# Patient Record
Sex: Female | Born: 1980 | Race: White | Hispanic: No | Marital: Married | Smoking: Never smoker
Health system: Southern US, Community
[De-identification: ages and names within clinical notes are randomized; demographics above are authoritative.]

## PROBLEM LIST (undated history)

## (undated) DIAGNOSIS — C801 Malignant (primary) neoplasm, unspecified: Secondary | ICD-10-CM

## (undated) HISTORY — PX: BREAST FIBROADENOMA SURGERY: SHX580

## (undated) HISTORY — PX: WISDOM TOOTH EXTRACTION: SHX21

---

## 2012-10-05 ENCOUNTER — Other Ambulatory Visit (HOSPITAL_COMMUNITY)
Admission: RE | Admit: 2012-10-05 | Discharge: 2012-10-05 | Disposition: A | Discharge status: Home / Self Care | Payer: BC Managed Care – PPO | Source: Ambulatory Visit | Attending: Family Medicine | Admitting: Family Medicine

## 2012-10-05 DIAGNOSIS — Z124 Encounter for screening for malignant neoplasm of cervix: Secondary | ICD-10-CM | POA: Insufficient documentation

## 2014-09-23 ENCOUNTER — Other Ambulatory Visit (HOSPITAL_COMMUNITY): Payer: Self-pay | Admitting: Obstetrics & Gynecology

## 2014-09-23 DIAGNOSIS — N979 Female infertility, unspecified: Secondary | ICD-10-CM

## 2014-09-29 ENCOUNTER — Ambulatory Visit (HOSPITAL_COMMUNITY)
Admission: RE | Admit: 2014-09-29 | Discharge: 2014-09-29 | Disposition: A | Discharge status: Home / Self Care | Payer: BC Managed Care – PPO | Source: Ambulatory Visit | Attending: Obstetrics & Gynecology | Admitting: Obstetrics & Gynecology

## 2014-09-29 DIAGNOSIS — N979 Female infertility, unspecified: Secondary | ICD-10-CM

## 2014-09-30 ENCOUNTER — Ambulatory Visit (HOSPITAL_COMMUNITY)
Admission: RE | Admit: 2014-09-30 | Discharge: 2014-09-30 | Disposition: A | Discharge status: Home / Self Care | Payer: BC Managed Care – PPO | Source: Ambulatory Visit | Attending: Obstetrics & Gynecology | Admitting: Obstetrics & Gynecology

## 2014-09-30 DIAGNOSIS — N979 Female infertility, unspecified: Secondary | ICD-10-CM | POA: Insufficient documentation

## 2014-09-30 MED ORDER — IOHEXOL 300 MG/ML  SOLN
20.0000 mL | Freq: Once | INTRAMUSCULAR | Status: AC | PRN
  Administered 2014-09-30: 20 mL

## 2015-08-13 NOTE — L&D Delivery Note (Signed)
Delivery Note - Water birth  AROM @1632 , clear AF Back into tub at 1700  Complete dilation at 1710 Onset of pushing at 1710 FHR second stage reassuring, intermittent auscultation.  Analgesia /Anesthesia intrapartum: none  Delivery of a viable baby girl at 8 by CNM in ROA --> ROT position.  Nuchal Cord - none. Cord double clamped after cessation of pulsation, cut by FOB.  Cord blood sample collected. Arterial cord blood sample not warranted.  Out of tub at 1735  Placenta delivered at 1741, schultz, intact with 3 VC. Velamentous cord insertion Placenta to L&D for disposal. Uterine tone atonic intermittently, bleeding moderate with clots removed from LUS. Pitocin IM 10 units initially, then IV Pitocin bolus started and oral misoprostol 400 mcg given.  Uterus firm and scant lochia after 10 min.   2nd degree perineal and L periurethral laceration identified.  Anesthesia: 1% lido Repair with 3.0 vycril in standard fashion, hemostasis noted.  Est. Blood Loss (mL): [id number] cc  Complications: none APGAR 7 / 9 Weight pending Mom to postpartum.  Baby to Couplet care / Skin to Skin.  Juliene Pina, CNM 03/14/2016, 6:31 PM

## 2016-03-14 ENCOUNTER — Encounter (HOSPITAL_COMMUNITY): Payer: Self-pay | Admitting: *Deleted

## 2016-03-14 ENCOUNTER — Inpatient Hospital Stay (HOSPITAL_COMMUNITY)
Admission: AD | Admit: 2016-03-14 | Discharge: 2016-03-16 | DRG: 775 | Disposition: A | Discharge status: Home / Self Care | Payer: BC Managed Care – PPO | Source: Ambulatory Visit | Attending: Obstetrics and Gynecology | Admitting: Obstetrics and Gynecology

## 2016-03-14 ENCOUNTER — Inpatient Hospital Stay (HOSPITAL_COMMUNITY)
Admission: AD | Admit: 2016-03-14 | Discharge: 2016-03-14 | Disposition: A | Discharge status: Home / Self Care | Payer: BC Managed Care – PPO | Source: Ambulatory Visit | Attending: Obstetrics and Gynecology | Admitting: Obstetrics and Gynecology

## 2016-03-14 ENCOUNTER — Encounter (HOSPITAL_COMMUNITY): Payer: Self-pay

## 2016-03-14 DIAGNOSIS — Z3A40 40 weeks gestation of pregnancy: Secondary | ICD-10-CM

## 2016-03-14 DIAGNOSIS — O471 False labor at or after 37 completed weeks of gestation: Secondary | ICD-10-CM

## 2016-03-14 DIAGNOSIS — Z6791 Unspecified blood type, Rh negative: Secondary | ICD-10-CM

## 2016-03-14 DIAGNOSIS — Z3490 Encounter for supervision of normal pregnancy, unspecified, unspecified trimester: Secondary | ICD-10-CM

## 2016-03-14 DIAGNOSIS — O26893 Other specified pregnancy related conditions, third trimester: Secondary | ICD-10-CM | POA: Diagnosis present

## 2016-03-14 DIAGNOSIS — O43123 Velamentous insertion of umbilical cord, third trimester: Secondary | ICD-10-CM | POA: Diagnosis present

## 2016-03-14 DIAGNOSIS — Z3403 Encounter for supervision of normal first pregnancy, third trimester: Secondary | ICD-10-CM | POA: Diagnosis present

## 2016-03-14 DIAGNOSIS — O26899 Other specified pregnancy related conditions, unspecified trimester: Secondary | ICD-10-CM | POA: Diagnosis present

## 2016-03-14 LAB — OB RESULTS CONSOLE ABO/RH: RH Type: NEGATIVE

## 2016-03-14 LAB — CBC
HEMATOCRIT: 33 % — AB (ref 36.0–46.0)
Hemoglobin: 11.7 g/dL — ABNORMAL LOW (ref 12.0–15.0)
MCH: 32.8 pg (ref 26.0–34.0)
MCHC: 35.5 g/dL (ref 30.0–36.0)
MCV: 92.4 fL (ref 78.0–100.0)
Platelets: 241 10*3/uL (ref 150–400)
RBC: 3.57 MIL/uL — ABNORMAL LOW (ref 3.87–5.11)
RDW: 13.8 % (ref 11.5–15.5)
WBC: 11.8 10*3/uL — ABNORMAL HIGH (ref 4.0–10.5)

## 2016-03-14 LAB — OB RESULTS CONSOLE HEPATITIS B SURFACE ANTIGEN: HEP B S AG: NEGATIVE

## 2016-03-14 LAB — OB RESULTS CONSOLE GC/CHLAMYDIA
CHLAMYDIA, DNA PROBE: NEGATIVE
GC PROBE AMP, GENITAL: NEGATIVE

## 2016-03-14 LAB — OB RESULTS CONSOLE HIV ANTIBODY (ROUTINE TESTING): HIV: NONREACTIVE

## 2016-03-14 LAB — OB RESULTS CONSOLE RPR: RPR: NONREACTIVE

## 2016-03-14 LAB — OB RESULTS CONSOLE GBS: GBS: NEGATIVE

## 2016-03-14 LAB — OB RESULTS CONSOLE ANTIBODY SCREEN: Antibody Screen: NEGATIVE

## 2016-03-14 LAB — OB RESULTS CONSOLE RUBELLA ANTIBODY, IGM: Rubella: IMMUNE

## 2016-03-14 MED ORDER — COCONUT OIL OIL: 1.0000 "application " | TOPICAL_OIL | Status: DC | PRN

## 2016-03-14 MED ORDER — ACETAMINOPHEN 325 MG PO TABS: 650.0000 mg | ORAL_TABLET | ORAL | Status: DC | PRN

## 2016-03-14 MED ORDER — SOD CITRATE-CITRIC ACID 500-334 MG/5ML PO SOLN: 30.0000 mL | ORAL | Status: DC | PRN

## 2016-03-14 MED ORDER — MISOPROSTOL 200 MCG PO TABS
ORAL_TABLET | ORAL | Status: AC
  Administered 2016-03-14: 400 ug
  Filled 2016-03-14: qty 2

## 2016-03-14 MED ORDER — LACTATED RINGERS IV SOLN: INTRAVENOUS | Status: DC

## 2016-03-14 MED ORDER — ONDANSETRON HCL 4 MG PO TABS: 4.0000 mg | ORAL_TABLET | ORAL | Status: DC | PRN

## 2016-03-14 MED ORDER — OXYTOCIN 40 UNITS IN LACTATED RINGERS INFUSION - SIMPLE MED: 2.5000 [IU]/h | INTRAVENOUS | Status: DC

## 2016-03-14 MED ORDER — SIMETHICONE 80 MG PO CHEW: 80.0000 mg | CHEWABLE_TABLET | ORAL | Status: DC | PRN

## 2016-03-14 MED ORDER — OXYTOCIN 40 UNITS IN LACTATED RINGERS INFUSION - SIMPLE MED
20.0000 [IU] | Freq: Once | INTRAVENOUS | Status: AC
  Administered 2016-03-14: 20 [IU] via INTRAVENOUS

## 2016-03-14 MED ORDER — LIDOCAINE HCL (PF) 1 % IJ SOLN
30.0000 mL | INTRAMUSCULAR | Status: AC | PRN
  Administered 2016-03-14: 30 mL via SUBCUTANEOUS
  Filled 2016-03-14: qty 30

## 2016-03-14 MED ORDER — ONDANSETRON HCL 4 MG/2ML IJ SOLN: 4.0000 mg | Freq: Four times a day (QID) | INTRAMUSCULAR | Status: DC | PRN

## 2016-03-14 MED ORDER — BENZOCAINE-MENTHOL 20-0.5 % EX AERO
1.0000 "application " | INHALATION_SPRAY | CUTANEOUS | Status: DC | PRN
  Administered 2016-03-15: 1 via TOPICAL
  Filled 2016-03-14: qty 56

## 2016-03-14 MED ORDER — OXYCODONE-ACETAMINOPHEN 5-325 MG PO TABS: 2.0000 | ORAL_TABLET | ORAL | Status: DC | PRN

## 2016-03-14 MED ORDER — MISOPROSTOL 200 MCG PO TABS
400.0000 ug | ORAL_TABLET | Freq: Once | ORAL | Status: AC
  Administered 2016-03-14: 400 ug via ORAL

## 2016-03-14 MED ORDER — WITCH HAZEL-GLYCERIN EX PADS: 1.0000 "application " | MEDICATED_PAD | CUTANEOUS | Status: DC | PRN

## 2016-03-14 MED ORDER — IBUPROFEN 600 MG PO TABS
600.0000 mg | ORAL_TABLET | Freq: Four times a day (QID) | ORAL | Status: DC
  Administered 2016-03-15 – 2016-03-16 (×7): 600 mg via ORAL
  Filled 2016-03-14 (×7): qty 1

## 2016-03-14 MED ORDER — SENNOSIDES-DOCUSATE SODIUM 8.6-50 MG PO TABS
2.0000 | ORAL_TABLET | ORAL | Status: DC
  Administered 2016-03-15 (×2): 2 via ORAL
  Filled 2016-03-14 (×2): qty 2

## 2016-03-14 MED ORDER — OXYTOCIN 40 UNITS IN LACTATED RINGERS INFUSION - SIMPLE MED
INTRAVENOUS | Status: AC
  Filled 2016-03-14: qty 1000

## 2016-03-14 MED ORDER — LACTATED RINGERS IV SOLN: 500.0000 mL | INTRAVENOUS | Status: DC | PRN

## 2016-03-14 MED ORDER — PRENATAL MULTIVITAMIN CH
1.0000 | ORAL_TABLET | Freq: Every day | ORAL | Status: DC
  Administered 2016-03-15 – 2016-03-16 (×2): 1 via ORAL
  Filled 2016-03-14 (×2): qty 1

## 2016-03-14 MED ORDER — ONDANSETRON HCL 4 MG/2ML IJ SOLN
4.0000 mg | INTRAMUSCULAR | Status: DC | PRN
Start: 2016-03-14 — End: 2016-03-15

## 2016-03-14 MED ORDER — OXYTOCIN BOLUS FROM INFUSION: 500.0000 mL | Freq: Once | INTRAVENOUS | Status: DC

## 2016-03-14 MED ORDER — OXYCODONE-ACETAMINOPHEN 5-325 MG PO TABS: 1.0000 | ORAL_TABLET | ORAL | Status: DC | PRN

## 2016-03-14 MED ORDER — OXYTOCIN 10 UNIT/ML IJ SOLN
10.0000 [IU] | Freq: Once | INTRAMUSCULAR | Status: AC
Start: 2016-03-14 — End: 2016-03-14
  Administered 2016-03-14: 10 [IU] via INTRAMUSCULAR
  Filled 2016-03-14: qty 1

## 2016-03-14 MED ORDER — DIPHENHYDRAMINE HCL 25 MG PO CAPS: 25.0000 mg | ORAL_CAPSULE | Freq: Four times a day (QID) | ORAL | Status: DC | PRN

## 2016-03-14 MED ORDER — DIBUCAINE 1 % RE OINT: 1.0000 "application " | TOPICAL_OINTMENT | RECTAL | Status: DC | PRN

## 2016-03-14 NOTE — Progress Notes (Signed)
Ana Wu is a 35 y.o. G1P0 at [redacted]w[redacted]d by ultrasound admitted for active labor  Subjective: Labored in water for past 2 hrs, feels ctx stronger than this AM. No rectal pressure. Pain mostly in her back. Partner and Luvenia Starch (doula) in room providing labor support.   Objective:   VSSAF  FHT:  FHR: 130 bpm, on IEFM, no audible decels UC:   regular, every 4 minutes SVE:   Dilation: 8 Effacement (%): 90, 100 Station: -1 Exam by:: Haskel Khan, CNM BBOW, minimal show. ROT presentation  Labs:   Recent Labs  03/14/16 1109  WBC 11.8*  HGB 11.7*  HCT 33.0*  PLT 241    Assessment / Plan: G1 at [redacted]w[redacted]d, active labor  Low risk, desires minimal intervention in labor, planned waterbirth.  Labor: Progressing normally  Will come out of tub for next 1-2 hours, reposition on BB or bed intermittently.   Preeclampsia:  no signs or symptoms of toxicity Fetal Wellbeing:  reassuring Pain Control:  Labor support without medications and Water tub I/D:  n/a, GBS neg Anticipated MOD:  NSVD  Juliene Pina 03/14/2016, 3:25 PM

## 2016-03-14 NOTE — H&P (Signed)
  OB ADMISSION/ HISTORY & PHYSICAL:  Admission Date: 03/14/2016 10:10 AM  Admit Diagnosis: 40.5 weeks / active labor  Ana Wu is a 35 y.o. female presenting for labor onset.  Prenatal History: G1P0   EDC : 03/09/2016, by Other Basis  Prenatal care at [facility]  Primary Ob Provider: Cora Collum Prenatal course - IVF pregnancy with   Prenatal Labs: ABO, Rh:  A negative Antibody:  negative  (rhogam antepartum) Rubella:   Immune RPR:   NR HBsAg:   Negative HIV:   NR GTT: normal GBS:   negative  Medical / Surgical History :  Past medical history: No past medical history on file.   Past surgical history: No past surgical history on file.  Family History: No family history on file.   Social History:  has no tobacco, alcohol, and drug history on file.   Allergies: Review of patient's allergies indicates no known allergies.    Current Medications at time of admission:  Prior to Admission medications   Medication Sig Start Date End Date Taking? Authorizing Provider  Prenatal Vit-Fe Fumarate-FA (PRENATAL VITAMIN PO) Take by mouth.   Yes Historical Provider, MD   Review of Systems: Active FM ctx currently every 4 minutes No LOF bloody show present  Physical Exam:  VS: Blood pressure 124/62, pulse 82, temperature 99.1 F (37.3 C), temperature source Oral, resp. rate 16, height 5\' 3"  (1.6 m), weight 81.6 kg (180 lb).  General: alert and oriented, appears uncomfortable but calm / breathing well with ctx Heart: RRR Lungs: Clear lung fields Abdomen: Gravid, soft and non-tender, non-distended / uterus: gravid Extremities: no  edema  Genitalia / VE: Dilation: 7 Effacement (%): 90, 100 Station: -1 Exam by:: Artelia Laroche, CNM  FHR: baseline rate 130 / variability moderate / accelerations / no decelerations TOCO: ctx Q 3-4 minutes  Assessment: 40.[redacted] weeks gestation active stage of labor FHR category 1   Plan:  Admit Water immersion  Dr  Ronita Hipps notified of admission / plan of care   Artelia Laroche CNM, MSN, [facility] 03/14/2016, 10:42 AM

## 2016-03-14 NOTE — MAU Provider Note (Signed)
History     Chief Complaint  Patient presents with  . Labor Eval   HPI  Ctx since 1am, was checked about 1 hr ago 3-4 cm, + FM, no LOF/VB. Coping well with labor pains, feels a bit hungry now nausea has resolved. Desires unmedicated birth / water labor and birth. Has doula and partner for support.   OB History    Gravida Para Term Preterm AB Living   1             SAB TAB Ectopic Multiple Live Births                    Social History  Substance Use Topics  . Smoking status: Never Smoker  . Smokeless tobacco: Never Used  . Alcohol use No    Allergies: No Known Allergies  Prescriptions Prior to Admission  Medication Sig Dispense Refill Last Dose  . Prenatal Vit-Fe Fumarate-FA (PRENATAL VITAMIN PO) Take by mouth.       ROS Physical Exam   VSSAF FHR 130, mod var, + accels, no decels Ctx q 3-5 min, mild/mod to palp  Physical Exam:  General: NAD Abd: Soft, NT, EFW 7.5 lbs  Ext: no edema Neuro: DTRs normal SVE 5/100/-1, BBOW, minimal show, membranes stripped.    ED Course  Procedures   A/P:  G1 at [redacted]w[redacted]d, in labor FHT category 1 GBS neg, Rh neg, s/p rhogam at 28 wks Desires waterbirth and coping well with labor pain  After discussion with patient and her spouse, will DC home for next couple of hours to await active labor, may have light breakfast. Return for direct admit in next couple of hours or if SROM or rectal pressure with ctx.

## 2016-03-14 NOTE — Discharge Instructions (Signed)
Fetal Movement Counts  Patient Name: __________________________________________________ Patient Due Date: ____________________  Performing a fetal movement count is highly recommended in high-risk pregnancies, but it is good for every pregnant woman to do. Your health care provider may ask you to start counting fetal movements at 28 weeks of the pregnancy. Fetal movements often increase:  · After eating a full meal.  · After physical activity.  · After eating or drinking something sweet or cold.  · At rest.  Pay attention to when you feel the baby is most active. This will help you notice a pattern of your baby's sleep and wake cycles and what factors contribute to an increase in fetal movement. It is important to perform a fetal movement count at the same time each day when your baby is normally most active.   HOW TO COUNT FETAL MOVEMENTS  1. Find a quiet and comfortable area to sit or lie down on your left side. Lying on your left side provides the best blood and oxygen circulation to your baby.  2. Write down the day and time on a sheet of paper or in a journal.  3. Start counting kicks, flutters, swishes, rolls, or jabs in a 2-hour period. You should feel at least 10 movements within 2 hours.  4. If you do not feel 10 movements in 2 hours, wait 2-3 hours and count again. Look for a change in the pattern or not enough counts in 2 hours.  SEEK MEDICAL CARE IF:  · You feel less than 10 counts in 2 hours, tried twice.  · There is no movement in over an hour.  · The pattern is changing or taking longer each day to reach 10 counts in 2 hours.  · You feel the baby is not moving as he or she usually does.  Date: ____________ Movements: ____________ Start time: ____________ Finish time: ____________   Date: ____________ Movements: ____________ Start time: ____________ Finish time: ____________  Date: ____________ Movements: ____________ Start time: ____________ Finish time: ____________  Date: ____________ Movements:  ____________ Start time: ____________ Finish time: ____________  Date: ____________ Movements: ____________ Start time: ____________ Finish time: ____________  Date: ____________ Movements: ____________ Start time: ____________ Finish time: ____________  Date: ____________ Movements: ____________ Start time: ____________ Finish time: ____________  Date: ____________ Movements: ____________ Start time: ____________ Finish time: ____________   Date: ____________ Movements: ____________ Start time: ____________ Finish time: ____________  Date: ____________ Movements: ____________ Start time: ____________ Finish time: ____________  Date: ____________ Movements: ____________ Start time: ____________ Finish time: ____________  Date: ____________ Movements: ____________ Start time: ____________ Finish time: ____________  Date: ____________ Movements: ____________ Start time: ____________ Finish time: ____________  Date: ____________ Movements: ____________ Start time: ____________ Finish time: ____________  Date: ____________ Movements: ____________ Start time: ____________ Finish time: ____________   Date: ____________ Movements: ____________ Start time: ____________ Finish time: ____________  Date: ____________ Movements: ____________ Start time: ____________ Finish time: ____________  Date: ____________ Movements: ____________ Start time: ____________ Finish time: ____________  Date: ____________ Movements: ____________ Start time: ____________ Finish time: ____________  Date: ____________ Movements: ____________ Start time: ____________ Finish time: ____________  Date: ____________ Movements: ____________ Start time: ____________ Finish time: ____________  Date: ____________ Movements: ____________ Start time: ____________ Finish time: ____________   Date: ____________ Movements: ____________ Start time: ____________ Finish time: ____________  Date: ____________ Movements: ____________ Start time: ____________ Finish  time: ____________  Date: ____________ Movements: ____________ Start time: ____________ Finish time: ____________  Date: ____________ Movements: ____________ Start time:   ____________ Finish time: ____________  Date: ____________ Movements: ____________ Start time: ____________ Finish time: ____________  Date: ____________ Movements: ____________ Start time: ____________ Finish time: ____________  Date: ____________ Movements: ____________ Start time: ____________ Finish time: ____________   Date: ____________ Movements: ____________ Start time: ____________ Finish time: ____________  Date: ____________ Movements: ____________ Start time: ____________ Finish time: ____________  Date: ____________ Movements: ____________ Start time: ____________ Finish time: ____________  Date: ____________ Movements: ____________ Start time: ____________ Finish time: ____________  Date: ____________ Movements: ____________ Start time: ____________ Finish time: ____________  Date: ____________ Movements: ____________ Start time: ____________ Finish time: ____________  Date: ____________ Movements: ____________ Start time: ____________ Finish time: ____________   Date: ____________ Movements: ____________ Start time: ____________ Finish time: ____________  Date: ____________ Movements: ____________ Start time: ____________ Finish time: ____________  Date: ____________ Movements: ____________ Start time: ____________ Finish time: ____________  Date: ____________ Movements: ____________ Start time: ____________ Finish time: ____________  Date: ____________ Movements: ____________ Start time: ____________ Finish time: ____________  Date: ____________ Movements: ____________ Start time: ____________ Finish time: ____________  Date: ____________ Movements: ____________ Start time: ____________ Finish time: ____________   Date: ____________ Movements: ____________ Start time: ____________ Finish time: ____________  Date: ____________  Movements: ____________ Start time: ____________ Finish time: ____________  Date: ____________ Movements: ____________ Start time: ____________ Finish time: ____________  Date: ____________ Movements: ____________ Start time: ____________ Finish time: ____________  Date: ____________ Movements: ____________ Start time: ____________ Finish time: ____________  Date: ____________ Movements: ____________ Start time: ____________ Finish time: ____________  Date: ____________ Movements: ____________ Start time: ____________ Finish time: ____________   Date: ____________ Movements: ____________ Start time: ____________ Finish time: ____________  Date: ____________ Movements: ____________ Start time: ____________ Finish time: ____________  Date: ____________ Movements: ____________ Start time: ____________ Finish time: ____________  Date: ____________ Movements: ____________ Start time: ____________ Finish time: ____________  Date: ____________ Movements: ____________ Start time: ____________ Finish time: ____________  Date: ____________ Movements: ____________ Start time: ____________ Finish time: ____________     This information is not intended to replace advice given to you by your health care provider. Make sure you discuss any questions you have with your health care provider.     Document Released: 08/28/2006 Document Revised: 08/19/2014 Document Reviewed: 05/25/2012  Elsevier Interactive Patient Education ©2016 [employer]

## 2016-03-14 NOTE — Progress Notes (Signed)
S: Doing well, coping with labor ctx well, laboring in water mostly with intermittent periods on birthing stool. Feeling pelvic pressure/rectal pressure occasionally for past hour.   O: Vitals:   03/14/16 1242 03/14/16 1246 03/14/16 1521 03/14/16 1608  BP:  (!) 111/58  (!) 141/84  Pulse:  86  90  Resp: 18  18 20   Temp: 98.5 F (36.9 C)  98.6 F (37 C)   TempSrc: Oral  Oral   Weight:      Height:         FHT:  FHR: 125 bpm, variability: moderate,  accelerations:  Present,  decelerations:  Absent UC:   regular, every 3-4 minutes SVE:   Dilation: 9 Effacement (%): 90, 100 Station: -1 Exam by:: Haskel Khan, CNM AROM clear AF  A / P: Spontaneous labor, progressing normally  Fetal Wellbeing:  Category I Pain Control:  Water tub Will return to water after 15 min EFM post AROM Anticipated MOD:  NSVD  Juliene Pina 03/14/2016, 4:36 PM

## 2016-03-14 NOTE — MAU Note (Signed)
UC since 0100am, denies bleeding

## 2016-03-14 NOTE — Progress Notes (Signed)
   03/14/16 0708  Provider Notification  Provider Name/Title Graceann Congress, CNM  Method of Notification Phone  RRenato Battles, cnm called back not to admit pt. Melina Copa, cnm oncoming provider will come and evaluate pt.

## 2016-03-15 LAB — CBC
HCT: 28.7 % — ABNORMAL LOW (ref 36.0–46.0)
HEMOGLOBIN: 10.2 g/dL — AB (ref 12.0–15.0)
MCH: 32.9 pg (ref 26.0–34.0)
MCHC: 35.5 g/dL (ref 30.0–36.0)
MCV: 92.6 fL (ref 78.0–100.0)
Platelets: 217 10*3/uL (ref 150–400)
RBC: 3.1 MIL/uL — ABNORMAL LOW (ref 3.87–5.11)
RDW: 14.1 % (ref 11.5–15.5)
WBC: 12.8 10*3/uL — AB (ref 4.0–10.5)

## 2016-03-15 LAB — RPR: RPR Ser Ql: NONREACTIVE

## 2016-03-15 NOTE — Progress Notes (Signed)
PPD 1 SVD  S:  Reports feeling well - little tired but slept some             Tolerating po/ No nausea or vomiting             Bleeding is light             Pain controlled with motrin             Up ad lib / ambulatory / voiding QS  Newborn breast feeding  / female O:               VS: BP 125/62 (BP Location: Right Arm)   Pulse 84   Temp 98.6 F (37 C) (Oral)   Resp 18   Ht 5\' 3"  (1.6 m)   Wt 81.6 kg (180 lb)   Breastfeeding? Unknown   BMI 31.89 kg/m    LABS:              Recent Labs  03/14/16 1109 03/15/16 0507  WBC 11.8* 12.8*  HGB 11.7* 10.2*  PLT 241 217               Blood type: --/--/A NEG (08/03 1109) / newborn negative  Rubella: Immune (08/03 1043)                           Physical Exam:             Alert and oriented X3  Abdomen: soft, non-tender, non-distended              Fundus: firm, non-tender, U-1  Perineum: ice pack in place  Lochia: ligt  Extremities: trace pedal edema, no calf pain or tenderness    A: PPD # 1   Doing well - stable status  P: Routine post partum orders   Artelia Laroche CNM, MSN, [facility] 03/15/2016, 10:45 AM

## 2016-03-15 NOTE — Lactation Note (Addendum)
This note was copied from a baby's chart. Lactation Consultation Note  Baby is 46 HOL and continues to be sleepy. She is able to suck well when a gloved finger in inserted deeply into her mouth. She has moments of tongue thrusting when she tries to push it out. She briefly suckled and a few swallows were heard.  She also received a few drops of colostrum on a spoon.  Baby was left skin-to-skin on her mother. Advised not having visitors handle her until she is feeding better. Information given on support groups and OP services. Follow-up tomorrow   Patient Name: Ana Wu [id number] Date: 03/15/2016 Reason for consult: Initial assessment   Maternal Data Has patient been taught Hand Expression?: Yes  Feeding Feeding Type: Breast Fed Length of feed: 2 min (few swallows and drops to mouth)  LATCH Score/Interventions Latch: Repeated attempts needed to sustain latch, nipple held in mouth throughout feeding, stimulation needed to elicit sucking reflex. Intervention(s): Skin to skin;Teach feeding cues;Waking techniques Intervention(s): Adjust position;Assist with latch;Breast compression  Audible Swallowing: A few with stimulation Intervention(s): Skin to skin;Hand expression Intervention(s): Skin to skin;Hand expression  Type of Nipple: Everted at rest and after stimulation  Comfort (Breast/Nipple): Filling, red/small blisters or bruises, mild/mod discomfort     Hold (Positioning): Full assist, staff holds infant at breast Intervention(s): Breastfeeding basics reviewed;Support Pillows;Position options;Skin to skin  LATCH Score: 5  Lactation Tools Discussed/Used     Consult Status      Van Clines 03/15/2016, 12:07 PM

## 2016-03-15 NOTE — Progress Notes (Signed)
At bedside with MOB. Pt is emotional about breastfeeding. She feels like shes doing something wrong because baby cries despite latching. Emotional support and education given at the bedside. Mom feeling better before I left.

## 2016-03-16 MED ORDER — IBUPROFEN 600 MG PO TABS: 600.0000 mg | ORAL_TABLET | Freq: Four times a day (QID) | ORAL | 0 refills | Status: DC

## 2016-03-16 NOTE — Discharge Summary (Signed)
OB Discharge Summary  Patient Name: Ana Wu DOB: Oct 19, 1980 MRN: [id number]  Date of admission: 03/14/2016  Admitting diagnosis: LABOR Intrauterine pregnancy: [redacted]w[redacted]d      Date of discharge: 03/16/2016    Discharge diagnosis: Term Pregnancy Delivered      Prenatal history: G1P1001   EDC : 03/09/2016, by Other Basis  Prenatal care at [facility]  Primary provider : Cora Collum Prenatal course uncomplicated  Prenatal Labs: ABO, Rh: --/--/A NEG (08/03 1109) / Rhophylac given antepartum x 2 Antibody: POS (08/03 1109) Rubella: Immune (08/03 1043)   RPR: Non Reactive (08/03 1109)  HBsAg: Negative (08/03 1043)  HIV: Non-reactive (08/03 1043)  GBS: Negative (08/03 1043)                                    Hospital course:  Onset of Labor With Vaginal Delivery     35 y.o. yo G1P1001 at [redacted]w[redacted]d was admitted in Active Labor on 03/14/2016. Patient had an uncomplicated labor course as follows:  Membrane Rupture Time/Date: 4:32 PM ,03/14/2016   Intrapartum Procedures: Episiotomy: None [1]                                         Lacerations:  2nd degree [3];Perineal [11] repaired Patient had a delivery of a Viable infant. 03/14/2016  Information for the patient's newborn:  Bev, Lawter Girl Milderd [id number]  Delivery Method: Vag-Spont    Pateint had an uncomplicated postpartum course.  She is ambulating, tolerating a regular diet, passing flatus, and urinating well. Patient is discharged home in stable condition on 03/16/16.  Delivering PROVIDER: Derrell Lolling C                                                            Complications: None  Newborn Data: Live born female  Birth Weight: 7 lb 15.3 oz (3610 g) APGAR: 7, 9  Baby Feeding: Breast Disposition:home with mother  Post partum procedures:none  Postpartum contraception: Not Discussed    Labs: Lab Results  Component Value Date   WBC 12.8 (H) 03/15/2016   HGB 10.2 (L) 03/15/2016   HCT 28.7 (L) 03/15/2016    MCV 92.6 03/15/2016   PLT 217 03/15/2016   No flowsheet data found.  Physical Exam @ time of discharge:  Vitals:   03/15/16 0005 03/15/16 1020 03/15/16 1800 03/16/16 0532  BP: 125/62 126/72 121/71 (!) 110/56  Pulse: 84 80 70 (!) 59  Resp: 18 18 18 18   Temp: 98.6 F (37 C) 98.4 F (36.9 C) 98 F (36.7 C) 97.6 F (36.4 C)  TempSrc: Oral Oral Oral Oral  Weight:      Height:        General: alert and cooperative Lochia: appropriate Uterine Fundus: firm Perineum: repair healing well / mild edema  Extremities: DVT Evaluation: No evidence of DVT seen on physical exam.   Discharge instructions:  "Baby and Me Booklet" and [facility] Booklet  Discharge Medications:    Medication List    TAKE these medications   ibuprofen 600 MG tablet Commonly known as:  ADVIL,MOTRIN Take 1  tablet (600 mg total) by mouth every 6 (six) hours.   OVER THE COUNTER MEDICATION Take 4 tablets by mouth daily. Patient takes over the counter Omega 3 supplement   PRENATAL VITAMIN PO Take by mouth.       Diet: routine diet  Activity: Advance as tolerated. Pelvic rest x 6 weeks.   Follow up:6 weeks    Signed: Artelia Laroche CNM, MSN, [facility] 03/16/2016, 11:16 AM

## 2016-03-16 NOTE — Lactation Note (Signed)
This note was copied from a baby's chart. Lactation Consultation Note Mom having difficulty obtaining deep latch per mom. Parents stated baby cluster fed all night, but didn't obtain but one good latch. Mom has flat nipples until rolled w/finger tips, everts well for short period. Nipples and areola very compressible to obtain deep latch if baby would suck. Baby very sleepy! Cool wet cloth aplied, much stimulation, w/o waking baby enough for BF. Jaundice. Report to RN to check Bili. Report to CN RN baby LC feels baby needs to stay another day for assistance in BF. Mom has pendulum breast, elevated w/cloth for support. Baby doesn't open very wide. Has thick upper labial frenulum, high palate, has good protrusion of tongue past gum line. W/gloved finger tried to stimulate suck, had no interest. No interest in BF at this time. Asked RN to set up DEBP for stimulation. LC to give shells to wear in bra. Had 8% weight loss, 6 voids, 6 stools. BF has short feedings. Discussed stimulating baby to feed longer. Will assess feedings later today.  Patient Name: Girl Nalina Work [id number] Date: 03/16/2016 Reason for consult: Follow-up assessment;Difficult latch;Hyperbilirubinemia   Maternal Data    Feeding Feeding Type: Breast Fed Length of feed: 0 min  LATCH Score/Interventions Latch: Too sleepy or reluctant, no latch achieved, no sucking elicited. Intervention(s): Skin to skin;Teach feeding cues;Waking techniques Intervention(s): Adjust position;Assist with latch;Breast massage;Breast compression  Audible Swallowing: None Intervention(s): Skin to skin;Hand expression Intervention(s): Alternate breast massage  Type of Nipple: Everted at rest and after stimulation  Comfort (Breast/Nipple): Soft / non-tender     Hold (Positioning): Full assist, staff holds infant at breast Intervention(s): Breastfeeding basics reviewed;Support Pillows;Position options;Skin to skin  LATCH Score: 4  Lactation Tools  Discussed/Used     Consult Status Consult Status: Follow-up Date: 03/16/16 Follow-up type: In-patient    Peggye Poon, [place]ta [place] 03/16/2016, 9:45 AM

## 2016-03-16 NOTE — Progress Notes (Signed)
PPD 2 SVD with 2nd degree repair  S:  Reports feeling tired             Tolerating po/ No nausea or vomiting             Bleeding is light             Pain controlled with motrin             Up ad lib / ambulatory / voiding QS  Newborn breast feeding - uncertain newborn DC today O:               VS: BP (!) 110/56 (BP Location: Left Arm)   Pulse (!) 59   Temp 97.6 F (36.4 C) (Oral)   Resp 18   Ht 5\' 3"  (1.6 m)   Wt 180 lb (81.6 kg)   Breastfeeding? Unknown   BMI 31.89 kg/m    LABS:              Recent Labs  03/14/16 1109 03/15/16 0507  WBC 11.8* 12.8*  HGB 11.7* 10.2*  PLT 241 217               Blood type: --/--/A NEG (08/03 1109) / newborn Rh negative - no rhogam indictaed  Rubella: Immune (08/03 1043)                           Physical Exam:             Alert and oriented X3  Abdomen: soft, non-tender, non-distended              Fundus: firm, non-tender, U-1  Perineum: mild edema  Lochia: light  Extremities:  Trace edema, no calf pain or tenderness    A: PPD # 2 SVD with 2nd degree repair  Doing well - stable status  P: Routine post partum orders  DC home - may room-in with newborn if no DC from [place], [place], MSN, [facility] 03/16/2016, 10:29 AM

## 2016-03-17 LAB — TYPE AND SCREEN
ABO/RH(D): A NEG
ANTIBODY SCREEN: POSITIVE
DAT, IGG: NEGATIVE
UNIT DIVISION: 0
UNIT DIVISION: 0

## 2017-02-09 DIAGNOSIS — C801 Malignant (primary) neoplasm, unspecified: Secondary | ICD-10-CM

## 2017-02-09 HISTORY — DX: Malignant (primary) neoplasm, unspecified: C80.1

## 2017-03-10 ENCOUNTER — Other Ambulatory Visit: Payer: Self-pay | Admitting: Radiology

## 2017-03-12 ENCOUNTER — Telehealth: Payer: Self-pay | Admitting: *Deleted

## 2017-03-12 NOTE — Telephone Encounter (Signed)
Confirmed [facility] for 03/19/17 at 0815 .  Instructions and contact information given.

## 2017-03-17 ENCOUNTER — Other Ambulatory Visit: Payer: Self-pay | Admitting: *Deleted

## 2017-03-17 ENCOUNTER — Encounter: Payer: Self-pay | Admitting: *Deleted

## 2017-03-17 DIAGNOSIS — Z170 Estrogen receptor positive status [ER+]: Principal | ICD-10-CM

## 2017-03-17 DIAGNOSIS — C50511 Malignant neoplasm of lower-outer quadrant of right female breast: Secondary | ICD-10-CM

## 2017-03-19 ENCOUNTER — Encounter: Payer: Self-pay | Admitting: Physical Therapy

## 2017-03-19 ENCOUNTER — Encounter: Payer: Self-pay | Admitting: *Deleted

## 2017-03-19 ENCOUNTER — Other Ambulatory Visit (HOSPITAL_BASED_OUTPATIENT_CLINIC_OR_DEPARTMENT_OTHER): Payer: BC Managed Care – PPO

## 2017-03-19 ENCOUNTER — Encounter: Payer: Self-pay | Admitting: Hematology and Oncology

## 2017-03-19 ENCOUNTER — Other Ambulatory Visit: Payer: Self-pay | Admitting: General Surgery

## 2017-03-19 ENCOUNTER — Ambulatory Visit: Payer: BC Managed Care – PPO | Attending: General Surgery | Admitting: Physical Therapy

## 2017-03-19 ENCOUNTER — Ambulatory Visit
Admission: RE | Admit: 2017-03-19 | Discharge: 2017-03-19 | Disposition: A | Discharge status: Home / Self Care | Payer: BC Managed Care – PPO | Source: Ambulatory Visit | Attending: Radiation Oncology | Admitting: Radiation Oncology

## 2017-03-19 ENCOUNTER — Ambulatory Visit (HOSPITAL_BASED_OUTPATIENT_CLINIC_OR_DEPARTMENT_OTHER): Payer: BC Managed Care – PPO | Admitting: Hematology and Oncology

## 2017-03-19 VITALS — BP 134/78 | HR 67 | Temp 97.9°F | Resp 20 | Ht 63.0 in | Wt 172.4 lb

## 2017-03-19 DIAGNOSIS — Z801 Family history of malignant neoplasm of trachea, bronchus and lung: Secondary | ICD-10-CM | POA: Diagnosis not present

## 2017-03-19 DIAGNOSIS — C773 Secondary and unspecified malignant neoplasm of axilla and upper limb lymph nodes: Secondary | ICD-10-CM

## 2017-03-19 DIAGNOSIS — Z800 Family history of malignant neoplasm of digestive organs: Secondary | ICD-10-CM | POA: Diagnosis not present

## 2017-03-19 DIAGNOSIS — C50511 Malignant neoplasm of lower-outer quadrant of right female breast: Secondary | ICD-10-CM

## 2017-03-19 DIAGNOSIS — R293 Abnormal posture: Secondary | ICD-10-CM | POA: Diagnosis present

## 2017-03-19 DIAGNOSIS — Z803 Family history of malignant neoplasm of breast: Secondary | ICD-10-CM | POA: Diagnosis not present

## 2017-03-19 DIAGNOSIS — Z170 Estrogen receptor positive status [ER+]: Principal | ICD-10-CM

## 2017-03-19 DIAGNOSIS — Z8042 Family history of malignant neoplasm of prostate: Secondary | ICD-10-CM | POA: Diagnosis not present

## 2017-03-19 LAB — CBC WITH DIFFERENTIAL/PLATELET
BASO%: 0.8 % (ref 0.0–2.0)
BASOS ABS: 0 10*3/uL (ref 0.0–0.1)
EOS%: 1.7 % (ref 0.0–7.0)
Eosinophils Absolute: 0.1 10*3/uL (ref 0.0–0.5)
HEMATOCRIT: 37.7 % (ref 34.8–46.6)
HGB: 12.7 g/dL (ref 11.6–15.9)
LYMPH#: 2.4 10*3/uL (ref 0.9–3.3)
LYMPH%: 46.3 % (ref 14.0–49.7)
MCH: 31.3 pg (ref 25.1–34.0)
MCHC: 33.7 g/dL (ref 31.5–36.0)
MCV: 92.9 fL (ref 79.5–101.0)
MONO#: 0.4 10*3/uL (ref 0.1–0.9)
MONO%: 6.8 % (ref 0.0–14.0)
NEUT#: 2.3 10*3/uL (ref 1.5–6.5)
NEUT%: 44.4 % (ref 38.4–76.8)
Platelets: 261 10*3/uL (ref 145–400)
RBC: 4.06 10*6/uL (ref 3.70–5.45)
RDW: 13.6 % (ref 11.2–14.5)
WBC: 5.3 10*3/uL (ref 3.9–10.3)

## 2017-03-19 LAB — COMPREHENSIVE METABOLIC PANEL
ALT: 11 U/L (ref 0–55)
ANION GAP: 10 meq/L (ref 3–11)
AST: 16 U/L (ref 5–34)
Albumin: 4 g/dL (ref 3.5–5.0)
Alkaline Phosphatase: 39 U/L — ABNORMAL LOW (ref 40–150)
BUN: 11.7 mg/dL (ref 7.0–26.0)
CALCIUM: 9.5 mg/dL (ref 8.4–10.4)
CHLORIDE: 104 meq/L (ref 98–109)
CO2: 26 mEq/L (ref 22–29)
Creatinine: 0.8 mg/dL (ref 0.6–1.1)
EGFR: >90 mL/min/{1.73_m2} (ref >90)
Glucose: 104 mg/dl (ref 70–140)
Potassium: 3.7 mEq/L (ref 3.5–5.1)
Sodium: 140 mEq/L (ref 136–145)
Total Bilirubin: 0.67 mg/dL (ref 0.20–1.20)
Total Protein: 7.4 g/dL (ref 6.4–8.3)

## 2017-03-19 NOTE — Progress Notes (Signed)
Clinical Social Work [place] Psychosocial Distress Screening [place]  Patient completed distress screening protocol and scored a 6 on the Psychosocial Distress Thermometer which indicates moderate distress. Clinical Social Worker met with patient and patients husband in [facility] to assess for distress and other psychosocial needs. Patient stated she was feeling overwhelmed and fearful, but its was helpful to have more information on her treatment plan.  Patient was tearful and expressed concerns for her daughter, treatment, and the future. CSW and patient discussed common feeling and emotions when being diagnosed with cancer, and the importance of support during treatment. CSW informed patient of the support team and support services at [facility]. CSW provided contact information and encouraged patient to call with any questions or concerns.  ONCBCN DISTRESS SCREENING 03/19/2017  Screening Type Initial Screening  Distress experienced in past week (1-10) 6  Emotional problem type Nervousness/Anxiety;Adjusting to illness  Information Concerns Type Lack of info about diagnosis;Lack of info about treatment     Johnnye Lana, MSW, LCSW, OSW-C Clinical Social Worker [facility] [phone]

## 2017-03-19 NOTE — Patient Instructions (Signed)

## 2017-03-19 NOTE — Progress Notes (Signed)
Radiation Oncology         [phone]) [phone] ________________________________  Initial outpatient Consultation  Name: Ana Wu MRN: [id number]  Date: 03/19/2017  DOB: [birth year]  CC:Leighton Ruff, MD  Stark Klein, MD   REFERRING PHYSICIAN: Stark Klein, MD  DIAGNOSIS:    ICD-10-CM   1. 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 female who presented with a palpable right breast mass and enlarged axillary node. She appreciated the node for about 5 weeks. She has a history of infertility treatment leading to pregnancy and birth of her daughter 1 year ago; she was postpartum for about one year prior to diagnosis. She has a history of fibroadenomas as well. Mammography revealed a right central breast mass followed by Ultrasound which appreciated a 2.5 cm breast mass in the LOQ and a 3.0 cm right axillary lymph node. There were additional benign findings. Biopsy of the breast mass and lymph node revealed invasive ductal carcinoma with characteristics as described above in the diagnosis.  The patient presents today to the Multidisciplinary Breast Clinic to discuss potential treatment options for her breast cancer, accompanied by her husband.  On review of systems, the patient has occasional tingling at the biopsy site. She also reports feelings of anxiety and worry since receiving her diagnosis.  She has a one-year-old child via IVF and is worried about not being able to have more children in the future. She has two frozen embryos.  PREVIOUS RADIATION THERAPY: No  PAST MEDICAL HISTORY: no other medical issues reported.    PAST SURGICAL HISTORY: Past Surgical History:  Procedure Laterality Date  . WISDOM TOOTH EXTRACTION      FAMILY HISTORY: family history includes Breast cancer in her paternal aunt and paternal aunt; Lung cancer in her paternal grandmother; Pancreatic cancer in her maternal grandmother; Prostate cancer in her paternal grandfather.  SOCIAL HISTORY:  reports that she has never smoked. She has never used smokeless tobacco. She reports that she drinks alcohol. She reports that she does not use drugs.  ALLERGIES: Patient has no known allergies.  MEDICATIONS:  Current Outpatient Prescriptions  Medication Sig Dispense Refill  . ibuprofen (ADVIL,MOTRIN) 600 MG tablet Take 1 tablet (600 mg total) by mouth every 6 (six) hours. (Patient not taking: Reported on 03/19/2017) 30 tablet 0  . NON FORMULARY Take 2 tablets by mouth daily. doTerra Omega 3    . NON FORMULARY Take 2 tablets by mouth daily. doTerra Microplex VM2 (multivitamin)     No current facility-administered medications for this encounter.     REVIEW OF SYSTEMS: A 10+ POINT REVIEW OF SYSTEMS WAS OBTAINED including neurology, dermatology, psychiatry, cardiac, respiratory, lymph, extremities, GI, GU, Musculoskeletal, constitutional, breasts, reproductive, HEENT.  All pertinent positives are noted in the HPI.  All others are negative.   PHYSICAL EXAM:  Vitals with BMI 03/19/2017  Height 5' 3"   Weight 172 lbs 6 oz  BMI 82.9  Systolic 562  Diastolic 78  Pulse 67  Respirations 20   General: Alert and oriented, in emotional distress and tearful. HEENT: Head is normocephalic. Extraocular movements are intact. Oropharynx is clear. Neck: Neck is supple, no palpable cervical or supraclavicular  lymphadenopathy. Heart: Regular in rate and rhythm with no murmurs, rubs, or gallops. Chest: Clear to auscultation bilaterally, with no rhonchi, wheezes, or rales. Abdomen: Soft, nontender, nondistended, with no rigidity or guarding. Extremities: No cyanosis or edema. Lymphatics: see Neck Exam Skin: No concerning lesions. Musculoskeletal: symmetric strength and muscle tone throughout. Neurologic: Cranial nerves II through XII are grossly intact. No obvious focalities. Speech is fluent. Coordination is intact. Psychiatric: Judgment and insight are intact. Affect is tearful but appropriate. Breasts: There is a palpaple lymph node in the right axilla about 3 cm in size. In the 7:00 region of the right breast there is a palpable mass not quite 2.5 cm in greatest dimension. No other palpable masses appreciated in the left breast or axilla.   ECOG = 0 - Asymptomatic (Fully active, able to carry on all predisease activities without restriction)   Eustace Pen MM, Creech RH, Tormey DC, et al. [id number]). "Toxicity and response criteria of the [facility] Group". [place] Oncol. 5 (6): 649-55   LABORATORY DATA:  Lab Results  Component Value Date   WBC 5.3 03/19/2017   HGB 12.7 03/19/2017   HCT 37.7 03/19/2017   MCV 92.9 03/19/2017   PLT 261 03/19/2017   CMP     Component Value Date/Time   NA 140 03/19/2017 0846   K 3.7 03/19/2017 0846   CO2 26 03/19/2017 0846   GLUCOSE 104 03/19/2017 0846   BUN 11.7 03/19/2017 0846   CREATININE 0.8 03/19/2017 0846   CALCIUM 9.5 03/19/2017 0846   PROT 7.4 03/19/2017 0846   ALBUMIN 4.0 03/19/2017 0846   AST 16 03/19/2017 0846   ALT 11 03/19/2017 0846   ALKPHOS 39 (L) 03/19/2017 0846   BILITOT 0.67 03/19/2017 0846        RADIOGRAPHY:  As above    IMPRESSION/PLAN: Right breast cancer  Consensus at tumor board this morning was to refer the patient to genetics, given her family history and age. Treatment plan is to perform an MRI of the  breasts and staging scans. I anticipate neoadjuvant chemotherapy then surgery then adjuvant radiation therapy. She will meet with Dr Barry Dienes next to discuss surgical plan.  It was a pleasure meeting the patient today. She plans for neoadjuvant chemotherapy, surgery, and then radiotherapy per tumor board consensus.  We discussed this plan, as well as the risks, benefits, and side effects of radiotherapy. RT to the breast and regional nodes should decrease risk of locoregional recurrence by about 2/3, along with a modest overall survival benefit. We discussed that radiation would take approximately 6 weeks to complete. I would give the patient a few weeks to heal following surgery before starting treatment planning. We spoke about acute effects including skin irritation and fatigue as well as much less common late effects including lung irritation.  We discussed lymphedema of the arm as well. We spoke about the latest technology that is used to minimize the risk of late effects for breast cancer patients undergoing radiotherapy. No guarantees of treatment were given. The patient is enthusiastic about proceeding with treatment. I look forward to participating in the patient's care.  MRI of bilateral breasts is scheduled for 03/24/2017. Whole body bone scan and CT scan of the abdomen and pelvis are scheduled for  03/26/2017.  She is thinking about getting a second opinion from [place]; I encouraged her to begin staging scans here in the meantime to expedite work up and eventual treatment.   Emotional support given today to the patient and her husband.  They expressed gratitude for our visit today.      __________________________________________   Eppie Gibson, MD   This document serves as a record of services personally performed by Eppie Gibson, MD. It was created on her behalf by Rae Lips, a trained medical scribe. The creation of this record is based on the scribe's personal observations and the  provider's statements to them. This document has been checked and approved by the attending provider.

## 2017-03-19 NOTE — Therapy (Signed)
[place], [place], [address] Phone: [phone]   Fax:  [phone]  Physical Therapy Evaluation  Patient Details  Name: Ana Wu MRN: [id number] Date of Birth: 02/26/1981 Referring Provider: Dr. Stark Klein  Encounter Date: 03/19/2017      PT End of Session - 03/19/17 1138    Visit Number 1   Number of Visits 1   PT Start Time 0925   PT Stop Time 0951   PT Time Calculation (min) 26 min   Activity Tolerance Patient tolerated treatment well   Behavior During Therapy [facility] for tasks assessed/performed      History reviewed. No pertinent past medical history.  Past Surgical History:  Procedure Laterality Date  . WISDOM TOOTH EXTRACTION      There were no vitals filed for this visit.       Subjective Assessment - 03/19/17 0951    Subjective Patient reports she is here today to be seen by her medical team for her newly diagnosed right breast cancer.   Patient is accompained by: Family member   Pertinent History Patient was diagnosed on 03/06/17 with right grade 3 invasive ductal carcinoma breast cancer. It measures 2.5 cm and is located in the lower outer quadrant. It is ER/PR positive and HER2 negative with a Ki67 of 85%. She had an axillary lymph node biopsied and it was positive.   Patient Stated Goals Reduce lymphedema risk and learn post op shoulder ROM HEP   Currently in Pain? No/denies            [place] PT Assessment - 03/19/17 0001      Assessment   Medical Diagnosis Right breast cancer   Referring Provider Dr. Stark Klein   Onset Date/Surgical Date 03/06/17   Hand Dominance Right   Prior Therapy none     Precautions   Precautions Other (comment)   Precaution Comments active cancer     Restrictions   Weight Bearing Restrictions No     Balance Screen   Has the patient fallen in the past 6 months No   Has the patient had a decrease in activity level because of a fear  of falling?  No   Is the patient reluctant to leave their home because of a fear of falling?  No     Home Environment   Living Environment Private residence   Living Arrangements Spouse/significant other;Children   Available Help at Discharge Family     Prior Function   Level of [place] Full time employment   Regulatory affairs officer school counselor at [employer] She walks 3-4x/week for 45 minutes     Cognition   Overall Cognitive Status Within Functional Limits for tasks assessed     Posture/Postural Control   Posture/Postural Control Postural limitations   Postural Limitations Rounded Shoulders;Forward head     ROM / Strength   AROM / PROM / Strength AROM;Strength     AROM   AROM Assessment Site Shoulder;Cervical   Right/Left Shoulder Right;Left   Right Shoulder Extension 48 Degrees   Right Shoulder Flexion 164 Degrees   Right Shoulder ABduction 170 Degrees   Right Shoulder Internal Rotation 78 Degrees   Right Shoulder External Rotation 82 Degrees   Left Shoulder Extension 50 Degrees   Left Shoulder Flexion 150 Degrees   Left Shoulder ABduction 163 Degrees   Left Shoulder Internal Rotation 67 Degrees   Left Shoulder External Rotation 80 Degrees  Cervical Flexion WNL   Cervical Extension WNL   Cervical - Right Side Bend WNL   Cervical - Left Side Bend WNL   Cervical - Right Rotation WNL   Cervical - Left Rotation WNL     Strength   Overall Strength Within functional limits for tasks performed           LYMPHEDEMA/ONCOLOGY QUESTIONNAIRE - 03/19/17 1136      Type   Cancer Type Right breast cancer     Lymphedema Assessments   Lymphedema Assessments Upper extremities     Right Upper Extremity Lymphedema   10 cm Proximal to Olecranon Process 31.5 cm   Olecranon Process 27.1 cm   10 cm Proximal to Ulnar Styloid Process 24.4 cm   Just Proximal to Ulnar Styloid Process 15.3 cm   Across Hand at [employer] 17.9  cm   At [place] of 2nd Digit 5.6 cm     Left Upper Extremity Lymphedema   10 cm Proximal to Olecranon Process 32.7 cm   Olecranon Process 27.8 cm   10 cm Proximal to Ulnar Styloid Process 23.5 cm   Just Proximal to Ulnar Styloid Process 15.3 cm   Across Hand at [employer] 17.9 cm   At [place] of 2nd Digit 5.5 cm         Objective measurements completed on examination: See above findings.     Patient was instructed today in a home exercise program today for post op shoulder range of motion. These included active assist shoulder flexion in sitting, scapular retraction, wall walking with shoulder abduction, and hands behind head external rotation.  She was encouraged to do these twice a day, holding 3 seconds and repeating 5 times when permitted by her physician.                 PT Education - 03/19/17 1138    Education provided Yes   Education Details Lymphedema risk reduction and post op shoulder ROM HEP   Person(s) Educated Patient   Methods Explanation;Demonstration;Handout   Comprehension Returned demonstration;Verbalized understanding              Breast Clinic Goals - 03/19/17 1142      Patient will be able to verbalize understanding of pertinent lymphedema risk reduction practices relevant to her diagnosis specifically related to skin care.   Time 1   Period Days   Status Achieved     Patient will be able to return demonstrate and/or verbalize understanding of the post-op home exercise program related to regaining shoulder range of motion.   Time 1   Period Days   Status Achieved     Patient will be able to verbalize understanding of the importance of attending the postoperative After Breast Cancer Class for further lymphedema risk reduction education and therapeutic exercise.   Time 1   Period Days   Status Achieved               Plan - 03/19/17 1138    Clinical Impression Statement Patient was diagnosed on 03/06/17 with right grade 3  invasive ductal carcinoma breast cancer. It measures 2.5 cm and is located in the lower outer quadrant. It is ER/PR positive and HER2 negative with a Ki67 of 85%. She had an axillary lymph node biopsied and it was positive. Her multidisciplinary medical team met prior to her assessments to determine a recommended treatment plan. She is planning to have neoadjuvant chemotherapy, a right lumpectomy and targeted axillary node  dissection, radiation, and anti-estrogen therapy. She will benefit from post op PT to regain shoulder ROM and reduce her risk of lymphedema.   History and Personal Factors relevant to plan of care: works full time and husband is currently out of work; has a 53 year old so has a great deal of emotional distress about her situation.   Clinical Presentation Evolving   Clinical Presentation due to: Unknown extent of disease; will have staging scans to determine extent of disease   Clinical Decision Making Moderate   Rehab Potential Excellent   Clinical Impairments Affecting Rehab Potential None   PT Frequency One time visit   PT Treatment/Interventions Patient/family education;Therapeutic exercise   PT Next Visit Plan Will f/u after surgery to determine PT needs   PT Home Exercise Plan Post op shoulder ROM HEP   Consulted and Agree with Plan of Care Patient;Family member/caregiver   Family Member Consulted Husband      Patient will benefit from skilled therapeutic intervention in order to improve the following deficits and impairments:  Postural dysfunction, Decreased knowledge of precautions, Pain, Impaired UE functional use, Decreased range of motion  Visit Diagnosis: Carcinoma of lower-outer quadrant of right breast in female, estrogen receptor positive ([place]) - Plan: PT plan of care cert/re-cert  Abnormal posture - Plan: PT plan of care cert/re-cert   Patient will follow up at outpatient cancer rehab if needed following surgery.  If the patient requires physical therapy at  that time, a specific plan will be dictated and sent to the referring physician for approval. The patient was educated today on appropriate basic range of motion exercises to begin post operatively and the importance of attending the After Breast Cancer class following surgery.  Patient was educated today on lymphedema risk reduction practices as it pertains to recommendations that will benefit the patient immediately following surgery.  She verbalized good understanding.  No additional physical therapy is indicated at this time.      Problem List Patient Active Problem List   Diagnosis Date Noted  . 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 Postpartum care following vaginal delivery (8/3) 03/15/2016  . Pregnancy 03/14/2016  . Normal labor 03/14/2016  . Rh negative status during pregnancy 03/14/2016   Annia Friendly, PT 03/19/17 11:44 AM  [place], [place], [address] Phone: [phone]   Fax:  [phone]  Name: Ana Wu MRN: [id number] Date of Birth: 1980-09-05

## 2017-03-19 NOTE — Progress Notes (Signed)
Nutrition Assessment  Reason for Assessment:  Pt seen in Breast Clinic  ASSESSMENT:  36 year old female with new diagnosis of breast cancer.  Past medical history reviewed  Patient tearful during visit.   Medications:  reviewed  Labs: reviewed  Anthropometrics:   Height: 63 inches Weight: 172 lb 6.4 oz BMI: 30.6   NUTRITION DIAGNOSIS: Food and nutrition related knowledge deficit related to new diagnosis of breast cancer as evidenced by no prior need for nutrition related information.  INTERVENTION:   Discussed and provided packet of information regarding nutritional tips for breast cancer patients.  Questions answered.  Teachback method used.  Contact information provided and patient knows to contact me with questions/concerns.    MONITORING, EVALUATION, and GOAL: Pt will consume a healthy plant based diet to maintain lean body mass throughout treatment.   Niccolas Loeper B. Zenia Resides, [place], [place] Registered Dietitian [phone] (pager)

## 2017-03-20 NOTE — Assessment & Plan Note (Signed)
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 After reviewing the pathology in detail, we proceeded to discuss the different treatment options between surgery, radiation, chemotherapy, antiestrogen therapies.  Recommendation based on multidisciplinary tumor board: 1. Neoadjuvant chemotherapy with Adriamycin and Cytoxan dose dense 4 followed by Abraxane weekly 12 2. Followed by breast conserving surgery with sentinel lymph node study vs targeted axillary dissection 3. Followed by adjuvant radiation therapy 4. Followed by Anti estrogen therapy  Chemotherapy Counseling: I discussed the risks and benefits of chemotherapy including the risks of nausea/ vomiting, risk of infection from low WBC count, fatigue due to chemo or anemia, bruising or bleeding due to low platelets, mouth sores, loss/ change in taste and decreased appetite. Liver and kidney function will be monitored through out chemotherapy as abnormalities in liver and kidney function may be a side effect of treatment. Cardiac dysfunction due to Adriamycin was discussed in detail. Risk of permanent bone marrow dysfunction due to chemo were also discussed.  Plan: 1. Port placement to be done next Monday 2. Echocardiogram 3. Chemotherapy class 4. Breast MRI 5. CT chest abdomen pelvis and bone scan for staging Genetic counseling will also be arranged  Patient wishes to get a second opinion at [facility].  We will see her as soon as she makes her final decision.

## 2017-03-20 NOTE — Progress Notes (Signed)
[facility] NOTE  Patient Care Team: Leighton Ruff, MD as PCP - General (Family Medicine) Nicholas Lose, MD as Consulting Physician (Hematology and Oncology) Stark Klein, MD as Consulting Physician (General Surgery) Eppie Gibson, MD as Attending Physician (Radiation Oncology)  CHIEF COMPLAINTS/PURPOSE OF CONSULTATION:  Newly diagnosed breast cancer  HISTORY OF PRESENTING ILLNESS:  Ana Wu 35 y.o. female is here because of recent diagnosis of rt breast cancer. Palpable Rt Breast mass 2.5 cm At 7:00, palpable enl axillary LN 3 cm. She is 1 year post-partum after using infertility drugs for 3 years. Biopsy of the mass and LN revealed Grade 3 IDC ER/PR weakly positive Her 2 Neg Ki 67 85% (2 cm mass at 9:00: FA). We discussed her case in the tumor board and she is here at [facility] clinic to discuss the treatment plan   I reviewed her records extensively and collaborated the history with the patient.  SUMMARY OF ONCOLOGIC HISTORY:   Malignant neoplasm of lower-outer quadrant of right breast of female, estrogen receptor positive ([facility])   03/10/2017 Initial Diagnosis    Palpable Rt Breast mass 2.5 cm At 7:00, palpable enl axillary LN 3 cm (1 year post-partum after using infertility drugs for 3 years): Grade 3 IDC ER/PR weakly positive Her 2 Neg Ki 67 85% (2 cm mass at 9:00: FA); T2N1 Stage 2B (New AJCC)      MEDICAL HISTORY:  No prior medical problems  SURGICAL HISTORY: Past Surgical History:  Procedure Laterality Date  . WISDOM TOOTH EXTRACTION      SOCIAL HISTORY: Social History   Social History  . Marital status: Married    Spouse name: N/A  . Number of children: N/A  . Years of education: N/A   Occupational History  . Not on file.   Social History Main Topics  . Smoking status: Never Smoker  . Smokeless tobacco: Never Used  . Alcohol use Yes     Comment: occas  . Drug use: No  . Sexual activity: Yes    Partners: Male   Other Topics  Concern  . Not on file   Social History Narrative  . No narrative on file    FAMILY HISTORY: Family History  Problem Relation Age of Onset  . Pancreatic cancer Maternal Grandmother   . Lung cancer Paternal Grandmother   . Prostate cancer Paternal Grandfather   . Breast cancer Paternal Aunt   . Breast cancer Paternal Aunt     ALLERGIES:  has No Known Allergies.  MEDICATIONS:  Current Outpatient Prescriptions  Medication Sig Dispense Refill  . NON FORMULARY Take 2 tablets by mouth daily. doTerra Omega 3    . NON FORMULARY Take 2 tablets by mouth daily. doTerra Microplex VM2 (multivitamin)    . ibuprofen (ADVIL,MOTRIN) 600 MG tablet Take 1 tablet (600 mg total) by mouth every 6 (six) hours. (Patient not taking: Reported on 03/19/2017) 30 tablet 0   No current facility-administered medications for this visit.     REVIEW OF SYSTEMS:   Constitutional: Denies fevers, chills or abnormal night sweats Eyes: Denies blurriness of vision, double vision or watery eyes Ears, nose, mouth, throat, and face: Denies mucositis or sore throat Respiratory: Denies cough, dyspnea or wheezes Cardiovascular: Denies palpitation, chest discomfort or lower extremity swelling Gastrointestinal:  Denies nausea, heartburn or change in bowel habits Skin: Denies abnormal skin rashes Lymphatics: Denies new lymphadenopathy or easy bruising Neurological:Denies numbness, tingling or new weaknesses Behavioral/Psych: Mood is stable, no new changes  Breast:  palpable lump in rt breast and axilla All other systems were reviewed with the patient and are negative.  PHYSICAL EXAMINATION: ECOG PERFORMANCE STATUS: 1 - Symptomatic but completely ambulatory  Vitals:   03/19/17 0859  BP: 134/78  Pulse: 67  Resp: 20  Temp: 97.9 F (36.6 C)   Filed Weights   03/19/17 0859  Weight: 172 lb 6.4 oz (78.2 kg)    GENERAL:alert, no distress and comfortable SKIN: skin color, texture, turgor are normal, no rashes or  significant lesions EYES: normal, conjunctiva are pink and non-injected, sclera clear OROPHARYNX:no exudate, no erythema and lips, buccal mucosa, and tongue normal  NECK: supple, thyroid normal size, non-tender, without nodularity LYMPH:  no palpable lymphadenopathy in the cervical, axillary or inguinal LUNGS: clear to auscultation and percussion with normal breathing effort HEART: regular rate & rhythm and no murmurs and no lower extremity edema ABDOMEN:abdomen soft, non-tender and normal bowel sounds Musculoskeletal:no cyanosis of digits and no clubbing  PSYCH: alert & oriented x 3 with fluent speech NEURO: no focal motor/sensory deficits BREAST:Palpable lump in rt Breast. No palpable axillary or supraclavicular lymphadenopathy (exam performed in the presence of a chaperone)   LABORATORY DATA:  I have reviewed the data as listed Lab Results  Component Value Date   WBC 5.3 03/19/2017   HGB 12.7 03/19/2017   HCT 37.7 03/19/2017   MCV 92.9 03/19/2017   PLT 261 03/19/2017   Lab Results  Component Value Date   NA 140 03/19/2017   K 3.7 03/19/2017   CO2 26 03/19/2017    RADIOGRAPHIC STUDIES: I have personally reviewed the radiological reports and agreed with the findings in the report.  ASSESSMENT AND PLAN:  Malignant neoplasm of lower-outer quadrant of right breast of female, estrogen receptor positive (H[facility]) Palpable Rt Breast mass 2.5 cm At 7:00, palpable enl axillary LN 3 cm (1 year post-partum after using infertility drugs for 3 years): Grade 3 IDC ER/PR weakly positive Her 2 Neg Ki 67 85% (2 cm mass at 9:00: FA); T2N1 Stage 2B (New AJCC)  Pathology and radiology counseling: Discussed with the patient, the details of pathology including the type of breast cancer,the clinical staging, the significance of ER, PR and HER-2/neu receptors and the implications for treatment. After reviewing the pathology in detail, we proceeded to discuss the different treatment options between  surgery, radiation, chemotherapy, antiestrogen therapies.  Recommendation based on multidisciplinary tumor board: 1. Neoadjuvant chemotherapy with Adriamycin and Cytoxan dose dense 4 followed by Abraxane weekly 12 2. Followed by breast conserving surgery with sentinel lymph node study vs targeted axillary dissection 3. Followed by adjuvant radiation therapy 4. Followed by Anti estrogen therapy  Chemotherapy Counseling: I discussed the risks and benefits of chemotherapy including the risks of nausea/ vomiting, risk of infection from low WBC count, fatigue due to chemo or anemia, bruising or bleeding due to low platelets, mouth sores, loss/ change in taste and decreased appetite. Liver and kidney function will be monitored through out chemotherapy as abnormalities in liver and kidney function may be a side effect of treatment. Cardiac dysfunction due to Adriamycin was discussed in detail. Risk of permanent bone marrow dysfunction due to chemo were also discussed.  Plan: 1. Port placement to be done next Monday 2. Echocardiogram 3. Chemotherapy class 4. Breast MRI 5. CT chest abdomen pelvis and bone scan for staging Genetic counseling will also be arranged  Patient wishes to get a second opinion at [facility]  We will see her as soon as she makes  her final decision.    All questions were answered. The patient knows to call the clinic with any problems, questions or concerns.    Rulon Eisenmenger, MD 03/20/17

## 2017-03-24 ENCOUNTER — Ambulatory Visit (HOSPITAL_BASED_OUTPATIENT_CLINIC_OR_DEPARTMENT_OTHER)
Admission: RE | Admit: 2017-03-24 | Discharge: 2017-03-24 | Disposition: A | Discharge status: Home / Self Care | Payer: BC Managed Care – PPO | Source: Ambulatory Visit | Attending: Hematology and Oncology | Admitting: Hematology and Oncology

## 2017-03-24 ENCOUNTER — Other Ambulatory Visit: Payer: BC Managed Care – PPO

## 2017-03-24 ENCOUNTER — Ambulatory Visit (HOSPITAL_COMMUNITY)
Admission: RE | Admit: 2017-03-24 | Discharge: 2017-03-24 | Disposition: A | Discharge status: Home / Self Care | Payer: BC Managed Care – PPO | Source: Ambulatory Visit | Attending: Hematology and Oncology | Admitting: Hematology and Oncology

## 2017-03-24 DIAGNOSIS — C50511 Malignant neoplasm of lower-outer quadrant of right female breast: Secondary | ICD-10-CM

## 2017-03-24 DIAGNOSIS — Z170 Estrogen receptor positive status [ER+]: Secondary | ICD-10-CM | POA: Diagnosis present

## 2017-03-24 LAB — ECHOCARDIOGRAM COMPLETE: Weight: 2752 oz

## 2017-03-24 MED ORDER — GADOBENATE DIMEGLUMINE 529 MG/ML IV SOLN
16.0000 mL | Freq: Once | INTRAVENOUS | Status: AC | PRN
  Administered 2017-03-24: 16 mL via INTRAVENOUS

## 2017-03-24 NOTE — Progress Notes (Signed)
  Echocardiogram 2D Echocardiogram has been performed.  Ana Wu M 03/24/2017, 11:21 AM

## 2017-03-24 NOTE — H&P (Signed)
Ana Wu 03/19/2017 7:55 AM Location: [facility] Patient #: [id number] DOB: [birth year] Undefined / Language: Ana Wu / Race: White Female   History of Present Illness Stark Klein MD; 03/19/2017 12:27 PM) The patient is a 36 year old female who presents with breast cancer. Patient is a very lovely 36 year old female referred for consultation by Dr. Drema Dallas with a new diagnosis of right breast cancer. The patient presented with a palpable right breast mass and lymph node. She is 1 year postpartum and required 3 years of infertility treatment. She underwent diagnostic imaging which demonstrated a 3 cm lymph node in the right axilla, a 2.5 cm mass in the lower outer quadrant, and a 2 cm mass in the right breast at 9:00 consistent with a fibroadenoma. She underwent core needle biopsy of the irregular mass in the lower outer quadrant and lymph node. Core needle biopsy was positive for grade 3 invasive ductal carcinoma with weak ER/PR positivity and HER-2 negative.   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 She is a G1 P1 with her first child at age 27. She had menarche at age 1. She is still having regular periods. She used birth control for around 13 years.  She does have a significant family history of cancer. She has 2 paternal aunts who had breast cancer in their 82s. Her father has not had cancer and is age 67. She had a paternal grandmother with lung cancer, a paternal grandfather with prostate cancer and leukemia, and a maternal grandmother with pancreatic cancer at age 48.  She still has to frozen embryos [employer] and is interested in having more children if possible.   Pathology 03/10/2017 Diagnosis 1. Breast, right, needle core biopsy - INVASIVE DUCTAL CARCINOMA. - SEE COMMENT. 2. Lymph node, needle/core biopsy, right - INVASIVE DUCTAL CARCINOMA. - SEE COMMENT. Microscopic Comment 1. -2.  The carcinoma in the two specimens is morphologically similar and is grade III. Lymph nodal tissue is not definitively identified in specimen 2.  Labs: CBC, CMET essentially normal.    Past Surgical History Tawni Pummel, RN; 03/19/2017 7:55 AM) Breast Biopsy  Bilateral. Oral Surgery   Diagnostic Studies History Tawni Pummel, RN; 03/19/2017 7:55 AM) Colonoscopy  never Mammogram  within last year Pap Smear  1-5 years ago  Medication History Tawni Pummel, RN; 03/19/2017 7:56 AM) Medications Reconciled  Social History Tawni Pummel, RN; 03/19/2017 7:55 AM) Alcohol use  Occasional alcohol use. Caffeine use  Tea. Tobacco use  Never smoker.  Family History Tawni Pummel, RN; 03/19/2017 7:55 AM) Alcohol Abuse  Father. Arthritis  Family Members In General. Breast Cancer  Family Members In General. Heart Disease  Family Members In General. Malignant Neoplasm Of Pancreas  Family Members In General. Respiratory Condition  Family Members In General.  Pregnancy / Birth History Tawni Pummel, RN; 03/19/2017 7:55 AM) Age at menarche  33 years. Contraceptive History  Oral contraceptives. Gravida  1 Length (months) of breastfeeding  3-6 Maternal age  27-35 Para  1 Regular periods   Other Problems Tawni Pummel, RN; 03/19/2017 7:55 AM) Gastroesophageal Reflux Disease     Review of Systems Sunday Spillers Ledford RN; 03/19/2017 7:55 AM) General Not Present- Appetite Loss, Chills, Fatigue, Fever, Night Sweats, Weight Gain and Weight Loss. Skin Not Present- Change in Wart/Mole, Dryness, Hives, Jaundice, New Lesions, Non-Healing Wounds, Rash and Ulcer. HEENT Present- Wears glasses/contact lenses. Not Present- Earache, Hearing Loss, Hoarseness, Nose Bleed, Oral Ulcers, Ringing  in the Ears, Seasonal Allergies, Sinus Pain, Sore Throat, Visual Disturbances and Yellow Eyes. Respiratory Not Present- Bloody sputum, Chronic Cough, Difficulty Breathing, Snoring and  Wheezing. Breast Not Present- Breast Mass, Breast Pain, Nipple Discharge and Skin Changes. Cardiovascular Not Present- Chest Pain, Difficulty Breathing Lying Down, Leg Cramps, Palpitations, Rapid Heart Rate, Shortness of Breath and Swelling of Extremities. Gastrointestinal Not Present- Abdominal Pain, Bloating, Bloody Stool, Change in Bowel Habits, Chronic diarrhea, Constipation, Difficulty Swallowing, Excessive gas, Gets full quickly at meals, Hemorrhoids, Indigestion, Nausea, Rectal Pain and Vomiting. Female Genitourinary Not Present- Frequency, Nocturia, Painful Urination, Pelvic Pain and Urgency. Musculoskeletal Not Present- Back Pain, Joint Pain, Joint Stiffness, Muscle Pain, Muscle Weakness and Swelling of Extremities. Neurological Not Present- Decreased Memory, Fainting, Headaches, Numbness, Seizures, Tingling, Tremor, Trouble walking and Weakness. Psychiatric Not Present- Anxiety, Bipolar, Change in Sleep Pattern, Depression, Fearful and Frequent crying. Endocrine Not Present- Cold Intolerance, Excessive Hunger, Hair Changes, Heat Intolerance, Hot flashes and New Diabetes. Hematology Not Present- Blood Thinners, Easy Bruising, Excessive bleeding, Gland problems, HIV and Persistent Infections.  Vitals Stark Klein MD; 03/19/2017 12:23 PM) 03/19/2017 12:22 PM Weight: 172 lb Height: 63in Body Surface Area: 1.81 m Body Mass Index: 30.47 kg/m  Temp.: 97.60F  Pulse: 67 (Regular)  Resp.: 20 (Unlabored)  BP: 134/77 (Sitting, Left Arm, Standard)       Physical Exam Stark Klein MD; 03/19/2017 12:29 PM) General Mental Status-Alert. General Appearance-Consistent with stated age. Hydration-Well hydrated. Voice-Normal.  Head and Neck Head-normocephalic, atraumatic with no lesions or palpable masses. Trachea-midline. Thyroid Gland Characteristics - normal size and consistency.  Eye Eyeball - Bilateral-Extraocular movements intact. Sclera/Conjunctiva -  Bilateral-No scleral icterus.  Chest and Lung Exam Chest and lung exam reveals -quiet, even and easy respiratory effort with no use of accessory muscles and on auscultation, normal breath sounds, no adventitious sounds and normal vocal resonance. Inspection Chest Wall - Normal. Back - normal.  Breast Note: symmetric bilaterally. mild to moderate ptosis. no nipple retraction or skin dimpling. minimal bruising. 2 cm palpable mass in the lower outer quadrant right breast. Palpable right axillary LN. Left breast wtihout abnormalities.   Cardiovascular Cardiovascular examination reveals -normal heart sounds, regular rate and rhythm with no murmurs and normal pedal pulses bilaterally.  Abdomen Inspection Inspection of the abdomen reveals - No Hernias. Palpation/Percussion Palpation and Percussion of the abdomen reveal - Soft, Non Tender, No Rebound tenderness, No Rigidity (guarding) and No hepatosplenomegaly. Auscultation Auscultation of the abdomen reveals - Bowel sounds normal.  Neurologic Neurologic evaluation reveals -alert and oriented x 3 with no impairment of recent or remote memory. Mental Status-Normal.  Musculoskeletal Global Assessment -Note: no gross deformities.  Normal Exam - Left-Upper Extremity Strength Normal and Lower Extremity Strength Normal. Normal Exam - Right-Upper Extremity Strength Normal and Lower Extremity Strength Normal.  Lymphatic Head & Neck  General Head & Neck Lymphatics: Bilateral - Description - Normal. Axillary  General Axillary Region: Bilateral - Description - Normal. Tenderness - Non Tender. Femoral & Inguinal  Generalized Femoral & Inguinal Lymphatics: Bilateral - Description - No Generalized lymphadenopathy.    Assessment & Plan Stark Klein MD; 03/19/2017 12:33 PM) PRIMARY CANCER OF LOWER OUTER QUADRANT OF RIGHT FEMALE BREAST (C50.511) Impression: Patient is a 36 year old female with a new diagnosis of clinical T2 N1  right breast cancer. Because of the size of her lymph node, she will get staging studies.  We will plan for neoadjuvant chemotherapy. She will be referred to genetics think it genetic testing. This gives [place] plenty  of time for genetic testing to come back. Genetic testing will help her determine whether she would like to pursue bilateral mastectomies or consideration of a lumpectomy.  She does have large breasts and a lumpectomy with certainly be technically feasible even without significant change in her tumor size. However, she is 36 years old and does have a higher than average risk of recurrence. Even if her genetics are negative she may opt to pursue bilateral mastectomies very reasonable given her family history. We're also ordering an MRI for neoadjuvant treatment. Her breast density is see an MRI will allow for best surgical planning.  She will be recommended to receive adjuvant anti-hormone treatment as well as adjuvant radiation. Our current plan is for port placement. I have reviewed in detail port placement including risks. She and her husband have been shown model of a port. I discussed the risk of pneumothorax as well as risk of malposition and malfunction. She understands and wishes to proceed.  65 min spent in evaluation, examination, counseling, and coordination of care. >50% spent in counseling. BREAST CANCER METASTASIZED TO AXILLARY LYMPH NODE (C50.919) Current Plans You are being scheduled for surgery- Our schedulers will call you.  You should hear from our office's scheduling department within 5 working days about the location, date, and time of surgery. We try to make accommodations for patient's preferences in scheduling surgery, but sometimes the OR schedule or the surgeon's schedule prevents [place] from making those accommodations.  If you have not heard from our office [phone]) in 5 working days, call the office and ask for your surgeon's nurse.  If you have other  questions about your diagnosis, plan, or surgery, call the office and ask for your surgeon's nurse.  Pt Education - [facility] Breast Cancer Information Given - [employer] "Breast Journey" Package Pt Education - [facility] port insertion education   Signed by Stark Klein, MD (03/19/2017 12:34 PM)

## 2017-03-25 ENCOUNTER — Telehealth (HOSPITAL_COMMUNITY): Payer: Self-pay | Admitting: Vascular Surgery

## 2017-03-25 ENCOUNTER — Telehealth: Payer: Self-pay | Admitting: *Deleted

## 2017-03-25 ENCOUNTER — Telehealth: Payer: Self-pay | Admitting: Hematology and Oncology

## 2017-03-25 ENCOUNTER — Encounter: Payer: Self-pay | Admitting: *Deleted

## 2017-03-25 NOTE — Telephone Encounter (Signed)
Spoke with patient yesterday when she was here for chemo education class.  She is doing well.  Her 2nd opinion is at [facility] on 8/27 but she would like to have everything scheduled here anyway.  Encouraged her to call with any needs or concerns.

## 2017-03-25 NOTE — Telephone Encounter (Signed)
Faxed records to [facility]

## 2017-03-25 NOTE — Telephone Encounter (Signed)
lvm to inform pt of 8/28 appts at 0945 per sch msg

## 2017-03-25 NOTE — Telephone Encounter (Signed)
Left pt message to make NP brst appt w/ echo

## 2017-03-26 ENCOUNTER — Encounter (HOSPITAL_COMMUNITY): Payer: Self-pay

## 2017-03-26 ENCOUNTER — Ambulatory Visit (HOSPITAL_COMMUNITY)
Admission: RE | Admit: 2017-03-26 | Discharge: 2017-03-26 | Disposition: A | Discharge status: Home / Self Care | Payer: BC Managed Care – PPO | Source: Ambulatory Visit | Attending: Hematology and Oncology | Admitting: Hematology and Oncology

## 2017-03-26 ENCOUNTER — Encounter (HOSPITAL_COMMUNITY)
Admission: RE | Admit: 2017-03-26 | Discharge: 2017-03-26 | Disposition: A | Discharge status: Home / Self Care | Payer: BC Managed Care – PPO | Source: Ambulatory Visit | Attending: Hematology and Oncology | Admitting: Hematology and Oncology

## 2017-03-26 DIAGNOSIS — K769 Liver disease, unspecified: Secondary | ICD-10-CM | POA: Insufficient documentation

## 2017-03-26 DIAGNOSIS — R590 Localized enlarged lymph nodes: Secondary | ICD-10-CM | POA: Diagnosis not present

## 2017-03-26 DIAGNOSIS — C50511 Malignant neoplasm of lower-outer quadrant of right female breast: Secondary | ICD-10-CM | POA: Diagnosis present

## 2017-03-26 DIAGNOSIS — N289 Disorder of kidney and ureter, unspecified: Secondary | ICD-10-CM | POA: Insufficient documentation

## 2017-03-26 DIAGNOSIS — N83202 Unspecified ovarian cyst, left side: Secondary | ICD-10-CM | POA: Insufficient documentation

## 2017-03-26 DIAGNOSIS — Z170 Estrogen receptor positive status [ER+]: Secondary | ICD-10-CM | POA: Insufficient documentation

## 2017-03-26 MED ORDER — IOPAMIDOL (ISOVUE-300) INJECTION 61%
INTRAVENOUS | Status: AC
  Filled 2017-03-26: qty 100

## 2017-03-26 MED ORDER — TECHNETIUM TC 99M MEDRONATE IV KIT
25.0000 | PACK | Freq: Once | INTRAVENOUS | Status: AC | PRN
  Administered 2017-03-26: 25 via INTRAVENOUS

## 2017-03-26 MED ORDER — IOPAMIDOL (ISOVUE-300) INJECTION 61%
100.0000 mL | Freq: Once | INTRAVENOUS | Status: AC | PRN
  Administered 2017-03-26: 100 mL via INTRAVENOUS

## 2017-03-27 ENCOUNTER — Ambulatory Visit (HOSPITAL_COMMUNITY): Payer: BC Managed Care – PPO

## 2017-03-27 ENCOUNTER — Encounter (HOSPITAL_COMMUNITY): Payer: Self-pay | Admitting: *Deleted

## 2017-03-27 ENCOUNTER — Ambulatory Visit (HOSPITAL_BASED_OUTPATIENT_CLINIC_OR_DEPARTMENT_OTHER)
Admission: RE | Admit: 2017-03-27 | Discharge: 2017-03-27 | Disposition: A | Discharge status: Home / Self Care | Payer: BC Managed Care – PPO | Source: Ambulatory Visit | Attending: General Surgery | Admitting: General Surgery

## 2017-03-27 ENCOUNTER — Ambulatory Visit (HOSPITAL_COMMUNITY): Payer: BC Managed Care – PPO | Admitting: Certified Registered"

## 2017-03-27 ENCOUNTER — Encounter (HOSPITAL_COMMUNITY)
Admission: RE | Disposition: A | Discharge status: Home / Self Care | Payer: Self-pay | Source: Ambulatory Visit | Attending: General Surgery

## 2017-03-27 DIAGNOSIS — Z811 Family history of alcohol abuse and dependence: Secondary | ICD-10-CM | POA: Insufficient documentation

## 2017-03-27 DIAGNOSIS — Z801 Family history of malignant neoplasm of trachea, bronchus and lung: Secondary | ICD-10-CM | POA: Insufficient documentation

## 2017-03-27 DIAGNOSIS — Z95828 Presence of other vascular implants and grafts: Secondary | ICD-10-CM

## 2017-03-27 DIAGNOSIS — Z806 Family history of leukemia: Secondary | ICD-10-CM | POA: Diagnosis not present

## 2017-03-27 DIAGNOSIS — Z9889 Other specified postprocedural states: Secondary | ICD-10-CM | POA: Insufficient documentation

## 2017-03-27 DIAGNOSIS — Z8249 Family history of ischemic heart disease and other diseases of the circulatory system: Secondary | ICD-10-CM | POA: Insufficient documentation

## 2017-03-27 DIAGNOSIS — Z803 Family history of malignant neoplasm of breast: Secondary | ICD-10-CM | POA: Diagnosis not present

## 2017-03-27 DIAGNOSIS — D0511 Intraductal carcinoma in situ of right breast: Secondary | ICD-10-CM | POA: Diagnosis not present

## 2017-03-27 DIAGNOSIS — Z800 Family history of malignant neoplasm of digestive organs: Secondary | ICD-10-CM | POA: Insufficient documentation

## 2017-03-27 DIAGNOSIS — C773 Secondary and unspecified malignant neoplasm of axilla and upper limb lymph nodes: Secondary | ICD-10-CM | POA: Diagnosis not present

## 2017-03-27 DIAGNOSIS — Z419 Encounter for procedure for purposes other than remedying health state, unspecified: Secondary | ICD-10-CM

## 2017-03-27 DIAGNOSIS — Z8261 Family history of arthritis: Secondary | ICD-10-CM | POA: Diagnosis not present

## 2017-03-27 HISTORY — PX: PORTACATH PLACEMENT: SHX2246

## 2017-03-27 LAB — HCG, SERUM, QUALITATIVE: PREG SERUM: NEGATIVE

## 2017-03-27 SURGERY — INSERTION, TUNNELED CENTRAL VENOUS DEVICE, WITH PORT
Anesthesia: General | Site: Chest | Laterality: Left

## 2017-03-27 MED ORDER — PROMETHAZINE HCL 25 MG/ML IJ SOLN: 6.2500 mg | INTRAMUSCULAR | Status: DC | PRN

## 2017-03-27 MED ORDER — LACTATED RINGERS IV SOLN
INTRAVENOUS | Status: DC
  Administered 2017-03-27: 13:00:00 via INTRAVENOUS

## 2017-03-27 MED ORDER — MEPERIDINE HCL 25 MG/ML IJ SOLN: 6.2500 mg | INTRAMUSCULAR | Status: DC | PRN

## 2017-03-27 MED ORDER — PROPOFOL 10 MG/ML IV BOLUS
INTRAVENOUS | Status: DC | PRN
  Administered 2017-03-27: 200 mg via INTRAVENOUS

## 2017-03-27 MED ORDER — LACTATED RINGERS IV SOLN: INTRAVENOUS | Status: DC

## 2017-03-27 MED ORDER — ACETAMINOPHEN 325 MG PO TABS: 650.0000 mg | ORAL_TABLET | ORAL | Status: DC | PRN

## 2017-03-27 MED ORDER — FENTANYL CITRATE (PF) 100 MCG/2ML IJ SOLN: 25.0000 ug | INTRAMUSCULAR | Status: DC | PRN

## 2017-03-27 MED ORDER — OXYCODONE HCL 5 MG PO TABS: 5.0000 mg | ORAL_TABLET | Freq: Four times a day (QID) | ORAL | 0 refills | Status: DC | PRN

## 2017-03-27 MED ORDER — SODIUM CHLORIDE 0.9% FLUSH: 3.0000 mL | Freq: Two times a day (BID) | INTRAVENOUS | Status: DC

## 2017-03-27 MED ORDER — 0.9 % SODIUM CHLORIDE (POUR BTL) OPTIME
TOPICAL | Status: DC | PRN
  Administered 2017-03-27: 1000 mL

## 2017-03-27 MED ORDER — HEPARIN SOD (PORK) LOCK FLUSH 100 UNIT/ML IV SOLN
INTRAVENOUS | Status: DC | PRN
  Administered 2017-03-27: 500 [IU]

## 2017-03-27 MED ORDER — OXYCODONE HCL 5 MG PO TABS
5.0000 mg | ORAL_TABLET | ORAL | Status: DC | PRN
  Administered 2017-03-27: 10 mg via ORAL

## 2017-03-27 MED ORDER — CHLORHEXIDINE GLUCONATE CLOTH 2 % EX PADS: 6.0000 | MEDICATED_PAD | Freq: Once | CUTANEOUS | Status: DC

## 2017-03-27 MED ORDER — ONDANSETRON HCL 4 MG/2ML IJ SOLN
INTRAMUSCULAR | Status: DC | PRN
  Administered 2017-03-27: 4 mg via INTRAVENOUS

## 2017-03-27 MED ORDER — CEFAZOLIN SODIUM-DEXTROSE 2-4 GM/100ML-% IV SOLN
2.0000 g | INTRAVENOUS | Status: AC
  Administered 2017-03-27: 2 g via INTRAVENOUS

## 2017-03-27 MED ORDER — LIDOCAINE 2% (20 MG/ML) 5 ML SYRINGE
INTRAMUSCULAR | Status: AC
  Filled 2017-03-27: qty 5

## 2017-03-27 MED ORDER — FENTANYL CITRATE (PF) 100 MCG/2ML IJ SOLN
INTRAMUSCULAR | Status: DC | PRN
  Administered 2017-03-27 (×3): 50 ug via INTRAVENOUS

## 2017-03-27 MED ORDER — MIDAZOLAM HCL 2 MG/2ML IJ SOLN
INTRAMUSCULAR | Status: AC
Start: 2017-03-27 — End: ?
  Filled 2017-03-27: qty 2

## 2017-03-27 MED ORDER — LIDOCAINE HCL (CARDIAC) 20 MG/ML IV SOLN
INTRAVENOUS | Status: DC | PRN
  Administered 2017-03-27: 60 mg via INTRAVENOUS

## 2017-03-27 MED ORDER — SODIUM CHLORIDE 0.9% FLUSH: 3.0000 mL | INTRAVENOUS | Status: DC | PRN

## 2017-03-27 MED ORDER — SODIUM CHLORIDE 0.9 % IV SOLN: 250.0000 mL | INTRAVENOUS | Status: DC | PRN

## 2017-03-27 MED ORDER — MIDAZOLAM HCL 5 MG/5ML IJ SOLN
INTRAMUSCULAR | Status: DC | PRN
  Administered 2017-03-27: 2 mg via INTRAVENOUS

## 2017-03-27 MED ORDER — PROPOFOL 10 MG/ML IV BOLUS
INTRAVENOUS | Status: AC
  Filled 2017-03-27: qty 20

## 2017-03-27 MED ORDER — FENTANYL CITRATE (PF) 250 MCG/5ML IJ SOLN
INTRAMUSCULAR | Status: AC
  Filled 2017-03-27: qty 5

## 2017-03-27 MED ORDER — HEPARIN SOD (PORK) LOCK FLUSH 100 UNIT/ML IV SOLN
INTRAVENOUS | Status: AC
  Filled 2017-03-27: qty 5

## 2017-03-27 MED ORDER — ACETAMINOPHEN 650 MG RE SUPP: 650.0000 mg | RECTAL | Status: DC | PRN

## 2017-03-27 MED ORDER — HEPARIN SODIUM (PORCINE) 5000 UNIT/ML IJ SOLN
INTRAMUSCULAR | Status: DC | PRN
  Administered 2017-03-27: 500 mL

## 2017-03-27 MED ORDER — CEFAZOLIN SODIUM-DEXTROSE 2-4 GM/100ML-% IV SOLN
INTRAVENOUS | Status: AC
  Filled 2017-03-27: qty 100

## 2017-03-27 MED ORDER — DEXAMETHASONE SODIUM PHOSPHATE 10 MG/ML IJ SOLN
INTRAMUSCULAR | Status: DC | PRN
  Administered 2017-03-27: 10 mg via INTRAVENOUS

## 2017-03-27 MED ORDER — OXYCODONE HCL 5 MG PO TABS
ORAL_TABLET | ORAL | Status: AC
  Filled 2017-03-27: qty 2

## 2017-03-27 MED ORDER — BUPIVACAINE-EPINEPHRINE (PF) 0.25% -1:200000 IJ SOLN
INTRAMUSCULAR | Status: DC | PRN
  Administered 2017-03-27: 10 mL via INTRAMUSCULAR

## 2017-03-27 SURGICAL SUPPLY — 47 items
BAG DECANTER FOR FLEXI CONT (MISCELLANEOUS) ×2 IMPLANT
BLADE SURG 11 STRL SS (BLADE) ×2 IMPLANT
BLADE SURG 15 STRL LF DISP TIS (BLADE) ×1 IMPLANT
BLADE SURG 15 STRL SS (BLADE) ×1
CANISTER SUCT 3000ML PPV (MISCELLANEOUS) IMPLANT
CHLORAPREP W/TINT 10.5 ML (MISCELLANEOUS) ×2 IMPLANT
COVER SURGICAL LIGHT HANDLE (MISCELLANEOUS) ×2 IMPLANT
COVER TRANSDUCER ULTRASND GEL (DRAPE) IMPLANT
CRADLE DONUT ADULT HEAD (MISCELLANEOUS) ×2 IMPLANT
DECANTER SPIKE VIAL GLASS SM (MISCELLANEOUS) ×2 IMPLANT
DERMABOND ADVANCED (GAUZE/BANDAGES/DRESSINGS) ×1
DERMABOND ADVANCED .7 DNX12 (GAUZE/BANDAGES/DRESSINGS) ×1 IMPLANT
DRAPE C-ARM 42X72 X-RAY (DRAPES) ×2 IMPLANT
DRAPE CHEST BREAST 15X10 FENES (DRAPES) ×2 IMPLANT
DRAPE UTILITY XL STRL (DRAPES) ×4 IMPLANT
DRAPE WARM FLUID 44X44 (DRAPE) IMPLANT
ELECT COATED BLADE 2.86 ST (ELECTRODE) ×2 IMPLANT
ELECT REM PT RETURN 9FT ADLT (ELECTROSURGICAL) ×2
ELECTRODE REM PT RTRN 9FT ADLT (ELECTROSURGICAL) ×1 IMPLANT
GAUZE SPONGE 4X4 16PLY XRAY LF (GAUZE/BANDAGES/DRESSINGS) ×2 IMPLANT
GEL ULTRASOUND 20GR AQUASONIC (MISCELLANEOUS) IMPLANT
GLOVE BIO SURGEON STRL SZ 6 (GLOVE) ×2 IMPLANT
GLOVE BIOGEL PI IND STRL 6.5 (GLOVE) ×1 IMPLANT
GLOVE BIOGEL PI INDICATOR 6.5 (GLOVE) ×1
GOWN STRL REUS W/ TWL LRG LVL3 (GOWN DISPOSABLE) ×2 IMPLANT
GOWN STRL REUS W/TWL 2XL LVL3 (GOWN DISPOSABLE) ×2 IMPLANT
GOWN STRL REUS W/TWL LRG LVL3 (GOWN DISPOSABLE) ×2
KIT BASIN OR (CUSTOM PROCEDURE TRAY) ×2 IMPLANT
KIT PORT POWER 8FR ISP CVUE (Miscellaneous) ×2 IMPLANT
KIT ROOM TURNOVER OR (KITS) ×2 IMPLANT
NEEDLE HYPO 25GX1X1/2 BEV (NEEDLE) ×2 IMPLANT
NS IRRIG 1000ML POUR BTL (IV SOLUTION) ×2 IMPLANT
PACK SURGICAL SETUP 50X90 (CUSTOM PROCEDURE TRAY) ×2 IMPLANT
PAD ARMBOARD 7.5X6 YLW CONV (MISCELLANEOUS) ×2 IMPLANT
PENCIL BUTTON HOLSTER BLD 10FT (ELECTRODE) ×2 IMPLANT
SPONGE LAP 18X18 X RAY DECT (DISPOSABLE) IMPLANT
SUT MON AB 4-0 PC3 18 (SUTURE) ×2 IMPLANT
SUT PROLENE 2 0 SH DA (SUTURE) ×4 IMPLANT
SUT VIC AB 3-0 SH 27 (SUTURE) ×1
SUT VIC AB 3-0 SH 27X BRD (SUTURE) ×1 IMPLANT
SYR 20ML ECCENTRIC (SYRINGE) ×4 IMPLANT
SYR 5ML LUER SLIP (SYRINGE) ×2 IMPLANT
SYR CONTROL 10ML LL (SYRINGE) ×2 IMPLANT
TOWEL OR 17X24 6PK STRL BLUE (TOWEL DISPOSABLE) ×2 IMPLANT
TOWEL OR 17X26 10 PK STRL BLUE (TOWEL DISPOSABLE) ×2 IMPLANT
TUBE CONNECTING 12X1/4 (SUCTIONS) IMPLANT
YANKAUER SUCT BULB TIP NO VENT (SUCTIONS) IMPLANT

## 2017-03-27 NOTE — Discharge Instructions (Addendum)
[facility] Office Phone Number [phone]   POST OP INSTRUCTIONS  Always review your discharge instruction sheet given to you by the facility where your surgery was performed.  IF YOU HAVE DISABILITY OR FAMILY LEAVE FORMS, YOU MUST BRING THEM TO THE OFFICE FOR PROCESSING.  DO NOT GIVE THEM TO YOUR DOCTOR.  1. A prescription for pain medication may be given to you upon discharge.  Take your pain medication as prescribed, if needed.  If narcotic pain medicine is not needed, then you may take acetaminophen (Tylenol) or ibuprofen (Advil) as needed. 2. Take your usually prescribed medications unless otherwise directed 3. If you need a refill on your pain medication, please contact your pharmacy.  They will contact our office to request authorization.  Prescriptions will not be filled after 5pm or on week-ends. 4. You should eat very light the first 24 hours after surgery, such as soup, crackers, pudding, etc.  Resume your normal diet the day after surgery 5. It is common to experience some constipation if taking pain medication after surgery.  Increasing fluid intake and taking a stool softener will usually help or prevent this problem from occurring.  A mild laxative (Milk of Magnesia or Miralax) should be taken according to package directions if there are no bowel movements after 48 hours. 6. You may shower in 48 hours.  The surgical glue will flake off in 2-3 weeks.   7. ACTIVITIES:  No strenuous activity or heavy lifting for 1 week.   a. You may drive when you no longer are taking prescription pain medication, you can comfortably wear a seatbelt, and you can safely maneuver your car and apply brakes. b. RETURN TO WORK:  __________1 week or as tolerated as long as no lifting for 1 week.  _______________ Dennis Bast should see your doctor in the office for a follow-up appointment approximately three-four weeks after your surgery.    WHEN TO CALL YOUR DOCTOR: 1. Fever over  101.0 2. Nausea and/or vomiting. 3. Extreme swelling or bruising. 4. Continued bleeding from incision. 5. Increased pain, redness, or drainage from the incision.  The clinic staff is available to answer your questions during regular business hours.  Please dont hesitate to call and ask to speak to one of the nurses for clinical concerns.  If you have a medical emergency, go to the nearest emergency room or call 911.  A surgeon from [facility] is always on call at the hospital.  For further questions, please visit [email or web address]

## 2017-03-27 NOTE — Transfer of Care (Signed)
Immediate Anesthesia Transfer of Care Note  Patient: Ana Wu  Procedure(s) Performed: Procedure(s): INSERTION PORT-A-CATH (N/A)  Patient Location: PACU  Anesthesia Type:General  Level of Consciousness: awake and patient cooperative  Airway & Oxygen Therapy: Patient Spontanous Breathing  Post-op Assessment: Report given to RN and Post -op Vital signs reviewed and stable  Post vital signs: Reviewed and stable  Last Vitals:  Vitals:   03/27/17 1306  BP: 132/71  Pulse: 86  Resp: 20  Temp: 36.8 C  SpO2: 100%    Last Pain:  Vitals:   03/27/17 1306  TempSrc: Oral      Patients Stated Pain Goal: 5 (84/03/97 9536)  Complications: No apparent anesthesia complications

## 2017-03-27 NOTE — Interval H&P Note (Signed)
History and Physical Interval Note:  03/27/2017 2:39 PM  Ana Wu  has presented today for surgery, with the diagnosis of RIGHT BREAST CANCER  The various methods of treatment have been discussed with the patient and family. After consideration of risks, benefits and other options for treatment, the patient has consented to  Procedure(s): INSERTION PORT-A-CATH (N/A) as a surgical intervention .  The patient's history has been reviewed, patient examined, no change in status, stable for surgery.  I have reviewed the patient's chart and labs.  Questions were answered to the patient's satisfaction.     Sherron Mummert

## 2017-03-27 NOTE — Anesthesia Procedure Notes (Signed)
Procedure Name: LMA Insertion Date/Time: 03/27/2017 3:06 PM Performed by: Lance Coon Pre-anesthesia Checklist: Patient identified, Emergency Drugs available, Suction available, Patient being monitored and Timeout performed Patient Re-evaluated:Patient Re-evaluated prior to induction Oxygen Delivery Method: Circle system utilized Preoxygenation: Pre-oxygenation with 100% oxygen Induction Type: IV induction LMA: LMA inserted LMA Size: 4.0 Number of attempts: 1 Placement Confirmation: positive ETCO2 and breath sounds checked- equal and bilateral Tube secured with: Tape Dental Injury: Teeth and Oropharynx as per pre-operative assessment

## 2017-03-27 NOTE — Anesthesia Preprocedure Evaluation (Signed)
Anesthesia Evaluation  Patient identified by MRN, date of birth, ID band Patient awake    Reviewed: Allergy & Precautions, NPO status , Patient's Chart, lab work & pertinent test results  Airway Mallampati: I  TM Distance: >3 FB Neck ROM: Full    Dental  (+) Teeth Intact, Dental Advisory Given   Pulmonary neg pulmonary ROS,    breath sounds clear to auscultation       Cardiovascular negative cardio ROS   Rhythm:Regular Rate:Normal     Neuro/Psych negative neurological ROS  negative psych ROS   GI/Hepatic negative GI ROS, Neg liver ROS,   Endo/Other  negative endocrine ROS  Renal/GU negative Renal ROS     Musculoskeletal negative musculoskeletal ROS (+)   Abdominal   Peds  Hematology negative hematology ROS (+)   Anesthesia Other Findings Day of surgery medications reviewed with the patient.  Reproductive/Obstetrics negative OB ROS                             Lab Results  Component Value Date   WBC 5.3 03/19/2017   HGB 12.7 03/19/2017   HCT 37.7 03/19/2017   MCV 92.9 03/19/2017   PLT 261 03/19/2017   Lab Results  Component Value Date   CREATININE 0.8 03/19/2017   BUN 11.7 03/19/2017   NA 140 03/19/2017   K 3.7 03/19/2017   CO2 26 03/19/2017   No results found for: INR, PROTIME  Echo: - Left ventricle: The cavity size was normal. Wall thickness was   normal. Systolic function was normal. The estimated ejection   fraction was in the range of 60% to 65%. Wall motion was normal;   there were no regional wall motion abnormalities. Left   ventricular diastolic function parameters were normal. - Aortic valve: Valve area (VTI): 1.94 cm^2. Valve area (Vmax): 2.1  Anesthesia Physical Anesthesia Plan  ASA: I  Anesthesia Plan: General   Post-op Pain Management:    Induction: Intravenous  PONV Risk Score and Plan: 4 or greater and Ondansetron, Dexamethasone, Midazolam,  Scopolamine patch - Pre-op and Propofol infusion  Airway Management Planned: LMA  Additional Equipment:   Intra-op Plan:   Post-operative Plan: Extubation in OR  Informed Consent: I have reviewed the patients History and Physical, chart, labs and discussed the procedure including the risks, benefits and alternatives for the proposed anesthesia with the patient or authorized representative who has indicated his/her understanding and acceptance.   Dental advisory given  Plan Discussed with: CRNA  Anesthesia Plan Comments:         Anesthesia Quick Evaluation

## 2017-03-27 NOTE — Op Note (Signed)
PREOPERATIVE DIAGNOSIS:  Right breast cancer, lower outer quadrant, +/+/-, cT2N1     POSTOPERATIVE DIAGNOSIS:  Same     PROCEDURE: Left subclavian port placement, Bard ClearVue  Power Port, MRI safe, 8-French.      SURGEON:  Stark Klein, MD      ANESTHESIA:  General   FINDINGS:  Good venous return, easy flush, and tip of the catheter and   SVC 22.5 cm.      SPECIMEN:  None.      ESTIMATED BLOOD LOSS:  Minimal.      COMPLICATIONS:  None known.      PROCEDURE:  Pt was identified in the holding area and taken to   the operating room, where patient was placed supine on the operating room   table.  General anesthesia was induced.  Patient's arms were tucked and the upper   chest and neck were prepped and draped in sterile fashion.  Time-out was   performed according to the surgical safety check list.  When all was   correct, we continued.   Local anesthetic was administered over this   area at the angle of the clavicle.  The vein was accessed with 1 pass(es) of the needle. There was good venous return and the wire passed easily with no ectopy.   Fluoroscopy was used to confirm that the wire was in the vena cava.      The patient was placed back level and the area for the pocket was anethetized   with local anesthetic.  A 3-cm transverse incision was made with a #15   blade.  Cautery was used to divide the subcutaneous tissues down to the   pectoralis muscle.  An [employer] retractor was used to elevate the skin   while a pocket was created on top of the pectoralis fascia.  The port   was placed into the pocket to confirm that it was of adequate size.  The   catheter was preattached to the port.  The port was then secured to the   pectoralis fascia with four 2-0 Prolene sutures.  These were clamped and   not tied down yet.    The catheter was tunneled through to the wire exit   site.  The catheter was placed along the wire to determine what length it should be to be in the SVC.  The  catheter was cut at 22.5 cm.  The tunneler sheath and dilator were passed over the wire and the dilator and wire were removed.  The catheter was advanced through the tunneler sheath and the tunneler sheath was pulled away.  Care was taken to keep the catheter in the tunneler sheath as this occurred. This was advanced and the tunneler sheath was removed.  There was good venous   return and easy flush of the catheter.  The Prolene sutures were tied   down to the pectoral fascia.  The skin was reapproximated using 3-0   Vicryl interrupted deep dermal sutures.    Fluoroscopy was used to re-confirm good position of the catheter.  The skin   was then closed using 4-0 Monocryl in a subcuticular fashion.  The port was flushed with concentrated heparin flush as well.  The wounds were then cleaned, dried, and dressed with Dermabond.  The patient was awakened from anesthesia and taken to the PACU in stable condition.  Needle, sponge, and instrument counts were correct.  Stark Klein, MD

## 2017-03-28 ENCOUNTER — Encounter (HOSPITAL_COMMUNITY): Payer: Self-pay | Admitting: General Surgery

## 2017-03-28 NOTE — Anesthesia Postprocedure Evaluation (Signed)
Anesthesia Post Note  Patient: Ana Wu  Procedure(s) Performed: Procedure(s) (LRB): INSERTION PORT-A-CATH (Left)     Patient location during evaluation: PACU Anesthesia Type: General Level of consciousness: awake and alert Pain management: pain level controlled Vital Signs Assessment: post-procedure vital signs reviewed and stable Respiratory status: spontaneous breathing, nonlabored ventilation, respiratory function stable and patient connected to nasal cannula oxygen Cardiovascular status: blood pressure returned to baseline and stable Postop Assessment: no signs of nausea or vomiting Anesthetic complications: no    Last Vitals:  Vitals:   03/27/17 1627 03/27/17 1644  BP: 114/70 118/79  Pulse: 64 64  Resp: 16 18  Temp:  36.9 C  SpO2: 97% 97%    Last Pain:  Vitals:   03/27/17 1715  TempSrc:   PainSc: 2                  Effie Berkshire

## 2017-04-01 ENCOUNTER — Other Ambulatory Visit: Payer: Self-pay | Admitting: Hematology and Oncology

## 2017-04-01 DIAGNOSIS — Z170 Estrogen receptor positive status [ER+]: Principal | ICD-10-CM

## 2017-04-01 DIAGNOSIS — C50511 Malignant neoplasm of lower-outer quadrant of right female breast: Secondary | ICD-10-CM

## 2017-04-01 MED ORDER — ONDANSETRON HCL 8 MG PO TABS: 8.0000 mg | ORAL_TABLET | Freq: Two times a day (BID) | ORAL | 1 refills | Status: DC | PRN

## 2017-04-01 MED ORDER — DEXAMETHASONE 4 MG PO TABS: 4.0000 mg | ORAL_TABLET | Freq: Every day | ORAL | 0 refills | Status: DC

## 2017-04-01 MED ORDER — PROCHLORPERAZINE MALEATE 10 MG PO TABS: 10.0000 mg | ORAL_TABLET | Freq: Four times a day (QID) | ORAL | 1 refills | Status: DC | PRN

## 2017-04-01 MED ORDER — LORAZEPAM 0.5 MG PO TABS: 0.5000 mg | ORAL_TABLET | Freq: Every day | ORAL | 0 refills | Status: DC

## 2017-04-01 MED ORDER — LIDOCAINE-PRILOCAINE 2.5-2.5 % EX CREA: TOPICAL_CREAM | CUTANEOUS | 3 refills | Status: DC

## 2017-04-01 NOTE — Progress Notes (Signed)
START ON PATHWAY REGIMEN - Breast   Doxorubicin + Cyclophosphamide (AC):   A cycle is every 21 days:     Doxorubicin      Cyclophosphamide   **Always confirm dose/schedule in your pharmacy ordering system**    Paclitaxel 80 mg/m2 Weekly:   Administer weekly:     Paclitaxel   **Always confirm dose/schedule in your pharmacy ordering system**    Patient Characteristics: Preoperative or Nonsurgical Candidate (Clinical Staging), Neoadjuvant Therapy followed by Surgery, Invasive Disease, Chemotherapy, HER2 Negative/Unknown/Equivocal, ER Positive Therapeutic Status: Preoperative or Nonsurgical Candidate (Clinical Staging) AJCC M Category: cM0 AJCC Grade: G3 Breast Surgical Plan: Neoadjuvant Therapy followed by Surgery ER Status: Positive (+) AJCC 8 Stage Grouping: IIB HER2 Status: Negative (-) AJCC T Category: cT2 AJCC N Category: cN1 PR Status: Positive (+) Intent of Therapy: Curative Intent, Discussed with Patient

## 2017-04-07 ENCOUNTER — Encounter: Payer: Self-pay | Admitting: Hematology and Oncology

## 2017-04-07 NOTE — Progress Notes (Signed)
Called pt to introduce myself as her Arboriculturist and to discuss copay assistance and the [employer].  Requested she return my call.

## 2017-04-08 ENCOUNTER — Telehealth: Payer: Self-pay | Admitting: *Deleted

## 2017-04-08 ENCOUNTER — Other Ambulatory Visit: Payer: BC Managed Care – PPO

## 2017-04-08 ENCOUNTER — Ambulatory Visit: Payer: BC Managed Care – PPO | Admitting: Hematology and Oncology

## 2017-04-08 ENCOUNTER — Ambulatory Visit: Payer: BC Managed Care – PPO

## 2017-04-08 NOTE — Assessment & Plan Note (Deleted)
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 After reviewing the pathology in detail, we proceeded to discuss the different treatment options between surgery, radiation, chemotherapy, antiestrogen therapies.  Recommendation based on multidisciplinary tumor board: 1. Neoadjuvant chemotherapy with Adriamycin and Cytoxan dose dense 4 followed by Abraxane weekly 12 2. Followed by breast conserving surgery with sentinel lymph node study vs targeted axillary dissection 3. Followed by adjuvant radiation therapy 4. Followed by Anti estrogen therapy ------------------------------------------------------------------------ Current treatment: Cycle 1 day 1 dose dense Adriamycin Cytoxan Antiemetics were reviewed Chemotherapy consent obtained Chemotherapy education completed Echocardiogram 03/24/2017: EF 60-65% Closely monitoring for chemotherapy toxicities. Return to clinic in one week for toxicity check

## 2017-04-08 NOTE — Telephone Encounter (Signed)
Received call from patient that she will be getting all her care at [facility].  I have cancelled all her appointments.

## 2017-04-15 ENCOUNTER — Ambulatory Visit: Payer: BC Managed Care – PPO | Admitting: Hematology and Oncology

## 2017-04-15 ENCOUNTER — Other Ambulatory Visit: Payer: BC Managed Care – PPO

## 2017-04-22 ENCOUNTER — Other Ambulatory Visit: Payer: BC Managed Care – PPO

## 2017-04-22 ENCOUNTER — Ambulatory Visit: Payer: BC Managed Care – PPO

## 2017-04-22 ENCOUNTER — Ambulatory Visit: Payer: BC Managed Care – PPO | Admitting: Hematology and Oncology

## 2017-05-13 DIAGNOSIS — D701 Agranulocytosis secondary to cancer chemotherapy: Principal | ICD-10-CM | POA: Diagnosis present

## 2017-05-13 DIAGNOSIS — C50511 Malignant neoplasm of lower-outer quadrant of right female breast: Secondary | ICD-10-CM | POA: Diagnosis present

## 2017-05-13 DIAGNOSIS — B370 Candidal stomatitis: Secondary | ICD-10-CM | POA: Diagnosis present

## 2017-05-13 DIAGNOSIS — R5081 Fever presenting with conditions classified elsewhere: Secondary | ICD-10-CM | POA: Diagnosis present

## 2017-05-13 DIAGNOSIS — Z803 Family history of malignant neoplasm of breast: Secondary | ICD-10-CM

## 2017-05-13 DIAGNOSIS — Z801 Family history of malignant neoplasm of trachea, bronchus and lung: Secondary | ICD-10-CM

## 2017-05-13 DIAGNOSIS — K649 Unspecified hemorrhoids: Secondary | ICD-10-CM | POA: Diagnosis present

## 2017-05-13 DIAGNOSIS — T451X5A Adverse effect of antineoplastic and immunosuppressive drugs, initial encounter: Secondary | ICD-10-CM | POA: Diagnosis present

## 2017-05-13 DIAGNOSIS — R197 Diarrhea, unspecified: Secondary | ICD-10-CM | POA: Diagnosis present

## 2017-05-13 DIAGNOSIS — Z170 Estrogen receptor positive status [ER+]: Secondary | ICD-10-CM

## 2017-05-13 DIAGNOSIS — K610 Anal abscess: Secondary | ICD-10-CM | POA: Diagnosis not present

## 2017-05-13 DIAGNOSIS — Z800 Family history of malignant neoplasm of digestive organs: Secondary | ICD-10-CM

## 2017-05-14 ENCOUNTER — Inpatient Hospital Stay (HOSPITAL_COMMUNITY)
Admission: EM | Admit: 2017-05-14 | Discharge: 2017-05-20 | DRG: 809 | Disposition: A | Discharge status: Home / Self Care | Payer: BC Managed Care – PPO | Attending: Family Medicine | Admitting: Family Medicine

## 2017-05-14 ENCOUNTER — Emergency Department (HOSPITAL_COMMUNITY): Payer: BC Managed Care – PPO

## 2017-05-14 ENCOUNTER — Encounter (HOSPITAL_COMMUNITY): Payer: Self-pay | Admitting: Emergency Medicine

## 2017-05-14 DIAGNOSIS — D61810 Antineoplastic chemotherapy induced pancytopenia: Secondary | ICD-10-CM

## 2017-05-14 DIAGNOSIS — R5081 Fever presenting with conditions classified elsewhere: Secondary | ICD-10-CM | POA: Diagnosis present

## 2017-05-14 DIAGNOSIS — Z803 Family history of malignant neoplasm of breast: Secondary | ICD-10-CM | POA: Diagnosis not present

## 2017-05-14 DIAGNOSIS — C50919 Malignant neoplasm of unspecified site of unspecified female breast: Secondary | ICD-10-CM | POA: Diagnosis not present

## 2017-05-14 DIAGNOSIS — D702 Other drug-induced agranulocytosis: Secondary | ICD-10-CM | POA: Diagnosis not present

## 2017-05-14 DIAGNOSIS — D649 Anemia, unspecified: Secondary | ICD-10-CM | POA: Diagnosis not present

## 2017-05-14 DIAGNOSIS — R197 Diarrhea, unspecified: Secondary | ICD-10-CM | POA: Diagnosis present

## 2017-05-14 DIAGNOSIS — D709 Neutropenia, unspecified: Secondary | ICD-10-CM | POA: Diagnosis not present

## 2017-05-14 DIAGNOSIS — T451X5A Adverse effect of antineoplastic and immunosuppressive drugs, initial encounter: Secondary | ICD-10-CM | POA: Diagnosis present

## 2017-05-14 DIAGNOSIS — Z801 Family history of malignant neoplasm of trachea, bronchus and lung: Secondary | ICD-10-CM | POA: Diagnosis not present

## 2017-05-14 DIAGNOSIS — K644 Residual hemorrhoidal skin tags: Secondary | ICD-10-CM

## 2017-05-14 DIAGNOSIS — Z170 Estrogen receptor positive status [ER+]: Secondary | ICD-10-CM

## 2017-05-14 DIAGNOSIS — B370 Candidal stomatitis: Secondary | ICD-10-CM | POA: Diagnosis present

## 2017-05-14 DIAGNOSIS — K611 Rectal abscess: Secondary | ICD-10-CM | POA: Diagnosis not present

## 2017-05-14 DIAGNOSIS — C50511 Malignant neoplasm of lower-outer quadrant of right female breast: Secondary | ICD-10-CM | POA: Diagnosis present

## 2017-05-14 DIAGNOSIS — Z800 Family history of malignant neoplasm of digestive organs: Secondary | ICD-10-CM | POA: Diagnosis not present

## 2017-05-14 DIAGNOSIS — K649 Unspecified hemorrhoids: Secondary | ICD-10-CM | POA: Diagnosis present

## 2017-05-14 DIAGNOSIS — K610 Anal abscess: Secondary | ICD-10-CM | POA: Diagnosis not present

## 2017-05-14 DIAGNOSIS — D696 Thrombocytopenia, unspecified: Secondary | ICD-10-CM | POA: Diagnosis not present

## 2017-05-14 DIAGNOSIS — K648 Other hemorrhoids: Secondary | ICD-10-CM | POA: Diagnosis not present

## 2017-05-14 DIAGNOSIS — R509 Fever, unspecified: Secondary | ICD-10-CM | POA: Diagnosis not present

## 2017-05-14 DIAGNOSIS — D701 Agranulocytosis secondary to cancer chemotherapy: Secondary | ICD-10-CM | POA: Diagnosis present

## 2017-05-14 HISTORY — DX: Malignant (primary) neoplasm, unspecified: C80.1

## 2017-05-14 LAB — COMPREHENSIVE METABOLIC PANEL
ALK PHOS: 41 U/L (ref 38–126)
ALT: 14 U/L (ref 14–54)
ANION GAP: 9 (ref 5–15)
AST: 12 U/L — ABNORMAL LOW (ref 15–41)
Albumin: 3.8 g/dL (ref 3.5–5.0)
BILIRUBIN TOTAL: 0.6 mg/dL (ref 0.3–1.2)
BUN: 11 mg/dL (ref 6–20)
CALCIUM: 8.7 mg/dL — AB (ref 8.9–10.3)
CO2: 23 mmol/L (ref 22–32)
Chloride: 101 mmol/L (ref 101–111)
Creatinine, Ser: 0.59 mg/dL (ref 0.44–1.00)
Glucose, Bld: 119 mg/dL — ABNORMAL HIGH (ref 65–99)
Potassium: 3.6 mmol/L (ref 3.5–5.1)
Sodium: 133 mmol/L — ABNORMAL LOW (ref 135–145)
TOTAL PROTEIN: 7 g/dL (ref 6.5–8.1)

## 2017-05-14 LAB — GASTROINTESTINAL PANEL BY PCR, STOOL (REPLACES STOOL CULTURE)

## 2017-05-14 LAB — URINALYSIS, ROUTINE W REFLEX MICROSCOPIC
BILIRUBIN URINE: NEGATIVE
GLUCOSE, UA: NEGATIVE mg/dL
HGB URINE DIPSTICK: NEGATIVE
KETONES UR: 20 mg/dL — AB
Leukocytes, UA: NEGATIVE
Nitrite: NEGATIVE
PH: 7 (ref 5.0–8.0)
Protein, ur: NEGATIVE mg/dL
Specific Gravity, Urine: 1.011 (ref 1.005–1.030)

## 2017-05-14 LAB — CBC WITH DIFFERENTIAL/PLATELET
BASOS PCT: 3 %
Basophils Absolute: 0 10*3/uL (ref 0.0–0.1)
Eosinophils Absolute: 0 10*3/uL (ref 0.0–0.7)
Eosinophils Relative: 0 %
HEMATOCRIT: 27.8 % — AB (ref 36.0–46.0)
Hemoglobin: 9.8 g/dL — ABNORMAL LOW (ref 12.0–15.0)
Lymphocytes Relative: 73 %
Lymphs Abs: 0.2 10*3/uL — ABNORMAL LOW (ref 0.7–4.0)
MCH: 31.4 pg (ref 26.0–34.0)
MCHC: 35.3 g/dL (ref 30.0–36.0)
MCV: 89.1 fL (ref 78.0–100.0)
Monocytes Absolute: 0.1 10*3/uL (ref 0.1–1.0)
Monocytes Relative: 15 %
NEUTROS ABS: 0 10*3/uL — AB (ref 1.7–7.7)
NEUTROS PCT: 9 %
Platelets: 105 10*3/uL — ABNORMAL LOW (ref 150–400)
RBC: 3.12 MIL/uL — ABNORMAL LOW (ref 3.87–5.11)
RDW: 13.5 % (ref 11.5–15.5)
WBC: 0.3 10*3/uL — CL (ref 4.0–10.5)

## 2017-05-14 LAB — I-STAT BETA HCG BLOOD, ED (MC, WL, AP ONLY): I-stat hCG, quantitative: <5 m[IU]/mL (ref <5)

## 2017-05-14 LAB — CBC
HEMATOCRIT: 31.9 % — AB (ref 36.0–46.0)
Hemoglobin: 11.1 g/dL — ABNORMAL LOW (ref 12.0–15.0)
MCH: 31 pg (ref 26.0–34.0)
MCHC: 34.8 g/dL (ref 30.0–36.0)
MCV: 89.1 fL (ref 78.0–100.0)
Platelets: 110 10*3/uL — ABNORMAL LOW (ref 150–400)
RBC: 3.58 MIL/uL — ABNORMAL LOW (ref 3.87–5.11)
RDW: 13.5 % (ref 11.5–15.5)
WBC: 0.3 10*3/uL — AB (ref 4.0–10.5)

## 2017-05-14 LAB — BASIC METABOLIC PANEL
ANION GAP: 9 (ref 5–15)
BUN: 9 mg/dL (ref 6–20)
CALCIUM: 9 mg/dL (ref 8.9–10.3)
CO2: 25 mmol/L (ref 22–32)
Chloride: 102 mmol/L (ref 101–111)
Creatinine, Ser: 0.6 mg/dL (ref 0.44–1.00)
GFR calc Af Amer: >60 mL/min (ref >60)
Glucose, Bld: 107 mg/dL — ABNORMAL HIGH (ref 65–99)
POTASSIUM: 3.5 mmol/L (ref 3.5–5.1)
SODIUM: 136 mmol/L (ref 135–145)

## 2017-05-14 LAB — I-STAT CG4 LACTIC ACID, ED: Lactic Acid, Venous: 0.55 mmol/L (ref 0.5–1.9)

## 2017-05-14 LAB — C DIFFICILE QUICK SCREEN W PCR REFLEX
C DIFFICILE (CDIFF) INTERP: NOT DETECTED
C DIFFICILE (CDIFF) TOXIN: NEGATIVE
C DIFFICLE (CDIFF) ANTIGEN: NEGATIVE

## 2017-05-14 LAB — HIV ANTIBODY (ROUTINE TESTING W REFLEX): HIV SCREEN 4TH GENERATION: NONREACTIVE

## 2017-05-14 MED ORDER — ADULT MULTIVITAMIN W/MINERALS CH
1.0000 | ORAL_TABLET | Freq: Every day | ORAL | Status: DC
  Administered 2017-05-14 – 2017-05-20 (×7): 1 via ORAL
  Filled 2017-05-14 (×7): qty 1

## 2017-05-14 MED ORDER — VANCOMYCIN HCL IN DEXTROSE 750-5 MG/150ML-% IV SOLN
750.0000 mg | Freq: Three times a day (TID) | INTRAVENOUS | Status: DC
  Administered 2017-05-14 – 2017-05-16 (×6): 750 mg via INTRAVENOUS
  Filled 2017-05-14 (×7): qty 150

## 2017-05-14 MED ORDER — DEXTROSE 5 % IV SOLN
2.0000 g | Freq: Three times a day (TID) | INTRAVENOUS | Status: DC
  Administered 2017-05-14 – 2017-05-20 (×19): 2 g via INTRAVENOUS
  Filled 2017-05-14 (×20): qty 2

## 2017-05-14 MED ORDER — HYDROCORTISONE 2.5 % RE CREA
TOPICAL_CREAM | Freq: Three times a day (TID) | RECTAL | Status: DC | PRN
  Filled 2017-05-14: qty 28.35

## 2017-05-14 MED ORDER — LORAZEPAM 0.5 MG PO TABS
0.5000 mg | ORAL_TABLET | Freq: Once | ORAL | Status: AC
  Administered 2017-05-14: 0.5 mg via ORAL
  Filled 2017-05-14: qty 1

## 2017-05-14 MED ORDER — ONDANSETRON HCL 4 MG PO TABS: 4.0000 mg | ORAL_TABLET | Freq: Four times a day (QID) | ORAL | Status: DC | PRN

## 2017-05-14 MED ORDER — NYSTATIN 100000 UNIT/ML MT SUSP
5.0000 mL | Freq: Four times a day (QID) | OROMUCOSAL | Status: DC
  Administered 2017-05-14 – 2017-05-20 (×26): 500000 [IU] via ORAL
  Filled 2017-05-14 (×26): qty 5

## 2017-05-14 MED ORDER — DOCUSATE SODIUM 100 MG PO CAPS: 100.0000 mg | ORAL_CAPSULE | Freq: Two times a day (BID) | ORAL | Status: DC | PRN

## 2017-05-14 MED ORDER — ACETAMINOPHEN 325 MG PO TABS
650.0000 mg | ORAL_TABLET | Freq: Four times a day (QID) | ORAL | Status: DC | PRN
Start: 2017-05-14 — End: 2017-05-20
  Administered 2017-05-14 – 2017-05-20 (×11): 650 mg via ORAL
  Filled 2017-05-14 (×10): qty 2

## 2017-05-14 MED ORDER — VANCOMYCIN HCL IN DEXTROSE 1-5 GM/200ML-% IV SOLN
1000.0000 mg | Freq: Once | INTRAVENOUS | Status: AC
  Administered 2017-05-14: 1000 mg via INTRAVENOUS
  Filled 2017-05-14: qty 200

## 2017-05-14 MED ORDER — PIPERACILLIN-TAZOBACTAM 3.375 G IVPB 30 MIN: 3.3750 g | Freq: Once | INTRAVENOUS | Status: DC

## 2017-05-14 MED ORDER — POLYETHYLENE GLYCOL 3350 17 G PO PACK: 17.0000 g | PACK | Freq: Every day | ORAL | Status: DC | PRN

## 2017-05-14 MED ORDER — PROCHLORPERAZINE MALEATE 10 MG PO TABS: 10.0000 mg | ORAL_TABLET | Freq: Four times a day (QID) | ORAL | Status: DC | PRN

## 2017-05-14 MED ORDER — SODIUM CHLORIDE 0.9 % IV BOLUS (SEPSIS)
1000.0000 mL | Freq: Once | INTRAVENOUS | Status: AC
  Administered 2017-05-14: 1000 mL via INTRAVENOUS

## 2017-05-14 MED ORDER — LORAZEPAM 0.5 MG PO TABS
0.5000 mg | ORAL_TABLET | Freq: Every evening | ORAL | Status: DC | PRN
  Filled 2017-05-14: qty 1

## 2017-05-14 MED ORDER — ONDANSETRON HCL 4 MG/2ML IJ SOLN: 4.0000 mg | Freq: Four times a day (QID) | INTRAMUSCULAR | Status: DC | PRN

## 2017-05-14 MED ORDER — ENOXAPARIN SODIUM 40 MG/0.4ML ~~LOC~~ SOLN
40.0000 mg | SUBCUTANEOUS | Status: DC
  Administered 2017-05-14 – 2017-05-20 (×7): 40 mg via SUBCUTANEOUS
  Filled 2017-05-14 (×7): qty 0.4

## 2017-05-14 MED ORDER — LORATADINE 10 MG PO TABS
10.0000 mg | ORAL_TABLET | Freq: Every day | ORAL | Status: DC | PRN
  Filled 2017-05-14: qty 1

## 2017-05-14 MED ORDER — SODIUM CHLORIDE 0.9 % IV SOLN
INTRAVENOUS | Status: AC
  Administered 2017-05-14 (×3): via INTRAVENOUS

## 2017-05-14 MED ORDER — SENNA 8.6 MG PO TABS: 1.0000 | ORAL_TABLET | Freq: Once | ORAL | Status: DC

## 2017-05-14 MED ORDER — SODIUM CHLORIDE 0.9% FLUSH
10.0000 mL | INTRAVENOUS | Status: DC | PRN
  Administered 2017-05-16: 10 mL
  Administered 2017-05-16: 20 mL
  Filled 2017-05-14 (×2): qty 40

## 2017-05-14 MED ORDER — ACETAMINOPHEN 650 MG RE SUPP: 650.0000 mg | Freq: Four times a day (QID) | RECTAL | Status: DC | PRN

## 2017-05-14 NOTE — H&P (Signed)
History and Physical    Ana Wu ZOX:[id number] DOB: 1981-03-06 DOA: 05/14/2017  PCP: Leighton Ruff, MD  Patient coming from: Home.  Chief Complaint: Fever and chills.  HPI: Ana Wu is a 36 y.o. female with breast cancer on chemotherapy last chemotherapy was last week and [facility] presents to the ER with complaints of persistent fever and chills. Patient has been having these symptoms for last 2-3 days. Patient had gone to her PCP 2 days ago and was prescribed antibiotics. Patient also noticed some oral thrush and also has started developing watery diarrhea and has had so far 4 episodes. Denies any abdominal pain nausea vomiting. Despite taking the oral antibiotics patient was still febrile and presented to the ER.   ED Course: In the ER patient was mildly tachycardic and febrile with temperatures around 100.65F. Chest x-ray was unremarkable and UA did not show any features concerning for UTI. On exam patient has oral thrush. Labs revealed neutropenia and pancytopenia. Blood cultures were obtained and patient started on empiric antibiotics.  Review of Systems: As per HPI, rest all negative.   Past Medical History:  Diagnosis Date  . Cancer [place] [facility])    Breast Right     Past Surgical History:  Procedure Laterality Date  . PORTACATH PLACEMENT Left 03/27/2017   Procedure: INSERTION PORT-A-CATH;  Surgeon: Stark Klein, MD;  Location: [facility];  Service: General;  Laterality: Left;  . WISDOM TOOTH EXTRACTION       reports that she has never smoked. She has never used smokeless tobacco. She reports that she drinks alcohol. She reports that she does not use drugs.  Allergies  Allergen Reactions  . No Known Allergies     Family History  Problem Relation Age of Onset  . Pancreatic cancer Maternal Grandmother   . Lung cancer Paternal Grandmother   . Prostate cancer Paternal Grandfather   . Breast cancer Paternal Aunt   . Breast cancer Paternal Aunt      Prior to Admission medications   Medication Sig Start Date End Date Taking? Authorizing Provider  acetaminophen (TYLENOL) 500 MG tablet Take 500 mg by mouth every 6 (six) hours as needed for mild pain.   Yes [provider]  dexamethasone (DECADRON) 4 MG tablet Take 1 tablet (4 mg total) by mouth daily. 1 tab daily for 2 days starting day after chemo with food Patient taking differently: Take 4 mg by mouth daily. As directed for 3 days starting day after chemo with food 04/01/17  Yes Nicholas Lose, MD  docusate sodium (COLACE) 100 MG capsule Take 100 mg by mouth 2 (two) times daily as needed for mild constipation.   Yes [provider]  hydrocortisone (ANUSOL-HC) 2.5 % rectal cream Place 1 application rectally 3 (three) times daily.   Yes [provider]  ibuprofen (ADVIL,MOTRIN) 200 MG tablet Take 400 mg by mouth every 8 (eight) hours as needed (for pain.).   Yes [provider]  lidocaine-prilocaine (EMLA) cream Apply to affected area once 04/01/17  Yes Nicholas Lose, MD  loratadine (CLARITIN) 10 MG tablet Take 10 mg by mouth daily as needed (for bone pain). For 2 days after chemo   Yes [provider]  LORazepam (ATIVAN) 0.5 MG tablet Take 1 tablet (0.5 mg total) by mouth at bedtime. Patient taking differently: Take 0.5 mg by mouth at bedtime as needed for sleep.  04/01/17  Yes Nicholas Lose, MD  Multiple Vitamin (MULTIVITAMIN WITH MINERALS) TABS tablet Take  1 tablet by mouth daily. doTerra Microplex VM2 (multivitamin)   Yes [provider]  Omega-3 Fatty Acids (OMEGA-3 PO) Take 2 capsules by mouth daily.   Yes [provider]  ondansetron (ZOFRAN) 8 MG tablet Take 1 tablet (8 mg total) by mouth 2 (two) times daily as needed. Start on the third day after chemotherapy. Patient taking differently: Take 8 mg by mouth 2 (two) times daily as needed for nausea. Start on the third day after chemotherapy. 04/01/17  Yes Nicholas Lose, MD    polyethylene glycol (MIRALAX / GLYCOLAX) packet Take 17 g by mouth daily as needed for moderate constipation.   Yes [provider]  prochlorperazine (COMPAZINE) 10 MG tablet Take 1 tablet (10 mg total) by mouth every 6 (six) hours as needed (Nausea or vomiting). 04/01/17  Yes Nicholas Lose, MD  senna (SENOKOT) 8.6 MG TABS tablet Take 1 tablet by mouth once.   Yes [provider]    Physical Exam: Vitals:   05/14/17 0009  BP: 135/80  Pulse: (!) 117  Resp: 20  Temp: 100.1 F (37.8 C)  TempSrc: Oral  SpO2: 99%  Weight: 73.9 kg (163 lb)  Height: 5\' 3"  (1.6 m)      Constitutional: Moderately built and nourished. Vitals:   05/14/17 0009  BP: 135/80  Pulse: (!) 117  Resp: 20  Temp: 100.1 F (37.8 C)  TempSrc: Oral  SpO2: 99%  Weight: 73.9 kg (163 lb)  Height: 5\' 3"  (1.6 m)   Eyes: Anicteric no pallor. ENMT: Thrush present. Neck: No neck rigidity no mass felt. Respiratory: No rhonchi or crepitations. Cardiovascular: S1-S2 heard no murmurs appreciated. Abdomen: Soft nontender bowel sounds present. No guarding or rigidity. Musculoskeletal: No edema. Skin: No rash. Neurologic: Alert awake oriented to time place and person. Moves all extremities. Psychiatric: Appears normal. Normal affect.   Labs on Admission: I have personally reviewed following labs and imaging studies  CBC:  Recent Labs Lab 05/14/17 0040  WBC 0.3*  NEUTROABS 0.0*  HGB 9.8*  HCT 27.8*  MCV 89.1  PLT 008*   Basic Metabolic Panel:  Recent Labs Lab 05/14/17 0040  NA 133*  K 3.6  CL 101  CO2 23  GLUCOSE 119*  BUN 11  CREATININE 0.59  CALCIUM 8.7*   GFR: Estimated Creatinine Clearance: 92.7 mL/min (by C-G formula based on SCr of 0.59 mg/dL). Liver Function Tests:  Recent Labs Lab 05/14/17 0040  AST 12*  ALT 14  ALKPHOS 41  BILITOT 0.6  PROT 7.0  ALBUMIN 3.8   No results for input(s): LIPASE, AMYLASE in the last 168 hours. No results for input(s): AMMONIA in  the last 168 hours. Coagulation Profile: No results for input(s): INR, PROTIME in the last 168 hours. Cardiac Enzymes: No results for input(s): CKTOTAL, CKMB, CKMBINDEX, TROPONINI in the last 168 hours. BNP (last 3 results) No results for input(s): PROBNP in the last 8760 hours. HbA1C: No results for input(s): HGBA1C in the last 72 hours. CBG: No results for input(s): GLUCAP in the last 168 hours. Lipid Profile: No results for input(s): CHOL, HDL, LDLCALC, TRIG, CHOLHDL, LDLDIRECT in the last 72 hours. Thyroid Function Tests: No results for input(s): TSH, T4TOTAL, FREET4, T3FREE, THYROIDAB in the last 72 hours. Anemia Panel: No results for input(s): VITAMINB12, FOLATE, FERRITIN, TIBC, IRON, RETICCTPCT in the last 72 hours. Urine analysis:    Component Value Date/Time   COLORURINE YELLOW 05/14/2017 0012   APPEARANCEUR CLEAR 05/14/2017 0012   LABSPEC 1.011 05/14/2017 0012  PHURINE 7.0 05/14/2017 0012   GLUCOSEU NEGATIVE 05/14/2017 0012   HGBUR NEGATIVE 05/14/2017 0012   BILIRUBINUR NEGATIVE 05/14/2017 0012   KETONESUR 20 (A) 05/14/2017 0012   PROTEINUR NEGATIVE 05/14/2017 0012   NITRITE NEGATIVE 05/14/2017 0012   LEUKOCYTESUR NEGATIVE 05/14/2017 0012   Sepsis Labs: @LABRCNTIP (procalcitonin:4,lacticidven:4) )No results found for this or any previous visit (from the past 240 hour(s)).   Radiological Exams on Admission: No results found.   Assessment/Plan Principal Problem:   Febrile neutropenia (HCC) Active Problems:   Malignant neoplasm of lower-outer quadrant of right breast of female, estrogen receptor positive ([place])    1. Febrile neutropenia - patient received chemotherapy recently. At this time patient is empirically placed on cefepime and since patient also has a mucosal lesions and vancomycin. Follow blood cultures. Also check stool studies. 2. Diarrhea - check stool studies. Gently hydrate. 3. Pancytopenia likely from chemotherapy - follow CBC with  differential. 4. Breast cancer being [facility] by [facility]. 5. Oral thrush on nystatin.  I have reviewed patient's old charts and labs.   DVT prophylaxis: Lovenox. Code Status: Full code.  Family Communication: Discussed with patient.  Disposition Plan: Home.  Consults called: None.  Admission status: Inpatient.    Rise Patience MD [facility] Pager [phone].  If 7PM-7AM, please contact night-coverage [email or web address] Password [email or web address]  05/14/2017, 3:35 AM

## 2017-05-14 NOTE — Progress Notes (Signed)
A consult was received from an ED physician for zosyn and vancomycin per pharmacy dosing.  The patient's profile has been reviewed for ht/wt/allergies/indication/available labs.   A one time order has been placed for Vancomycin 1 Gm and Zosyn 3.375 Gm.  Further antibiotics/pharmacy consults should be ordered by admitting physician if indicated.                       Thank you, Dorrene German 05/14/2017 3:16 AM

## 2017-05-14 NOTE — ED Notes (Signed)
Call report to [facility] [phone] at 4:40 am

## 2017-05-14 NOTE — Progress Notes (Signed)
Pt admitted after midnight. Please see earlier admission note by Dr. Hal Hope. Pt admitted for management for febrile neutropenia, diarrhea. Clinically stable this AM. Continue hydration, encouraged oral intake. CBC and BMP in AM.  Faye Ramsay, MD  [facility] Pager [phone]  If 7PM-7AM, please contact night-coverage [email or web address] Password [email or web address]

## 2017-05-14 NOTE — Progress Notes (Signed)
Pharmacy Antibiotic Note  YEIRA GULDEN is a 36 y.o. female with hx of Breast Ca currently undergoing chemo admitted on 05/14/2017 with febrile neutropenia.  Pharmacy has been consulted for cefepime and vancomycin dosing.  Plan: Cefepime 2 Gm IV q8h Vancomycin 1 Gm x1 then 750 mg IV q8h F/u scr/cultures/levels  Height: 5\' 3"  (160 cm) Weight: 163 lb (73.9 kg) IBW/kg (Calculated) : 52.4  Temp (24hrs), Avg:100.1 F (37.8 C), Min:100.1 F (37.8 C), Max:100.1 F (37.8 C)   Recent Labs Lab 05/14/17 0040 05/14/17 0046  WBC 0.3*  --   CREATININE 0.59  --   LATICACIDVEN  --  0.55    Estimated Creatinine Clearance: 92.7 mL/min (by C-G formula based on SCr of 0.59 mg/dL).    Allergies  Allergen Reactions  . No Known Allergies     Antimicrobials this admission: 10/3 cefepime >>  10/3 vancomycin >>   Dose adjustments this admission:   Microbiology results:  BCx:   UCx:    Sputum:    MRSA PCR:   Thank you for allowing pharmacy to be a part of this patient's care.  Dorrene German 05/14/2017 3:43 AM

## 2017-05-14 NOTE — ED Provider Notes (Signed)
[facility] DEPT Provider Note   CSN: [id number] Arrival date & time: 05/13/17  2355     History   Chief Complaint Chief Complaint  Patient presents with  . Fever  . Cancer Patient    HPI CHEYNNE VIRDEN is a 36 y.o. female.  The history is provided by the patient.  Fever    She has a history of breast cancer and is currently undergoing chemotherapy with doxorubicin and Cytoxan. Last infusion was 1 week ago. For the last 3 days, she has had fevers which have gone as high as 101.8. There've not been chills or sweats. She denies sore throat, rhinorrhea, cough, dysuria, diarrhea. She was seen at an urgent care center yesterday and given an injection of an antibiotic, but she does not know which antibiotic. She had received a dose of Neulasta following her chemotherapy.  Past Medical History:  Diagnosis Date  . Cancer [facility])    Breast Right     Patient Active Problem List   Diagnosis Date Noted  . Malignant neoplasm of lower-outer quadrant of right breast of female, estrogen receptor positive ([place]) 03/17/2017  . Postpartum care following vaginal delivery (8/3) 03/15/2016  . Pregnancy 03/14/2016  . Normal labor 03/14/2016  . Rh negative status during pregnancy 03/14/2016    Past Surgical History:  Procedure Laterality Date  . PORTACATH PLACEMENT Left 03/27/2017   Procedure: INSERTION PORT-A-CATH;  Surgeon: Stark Klein, MD;  Location: [place];  Service: General;  Laterality: Left;  . WISDOM TOOTH EXTRACTION      OB History    Gravida Para Term Preterm AB Living   2 1 1     1    SAB TAB Ectopic Multiple Live Births         0 1       Home Medications    Prior to Admission medications   Medication Sig Start Date End Date Taking? Authorizing Provider  acetaminophen (TYLENOL) 500 MG tablet Take 500 mg by mouth every 6 (six) hours as needed for mild pain.   Yes [provider]  dexamethasone (DECADRON) 4 MG tablet Take 1 tablet (4 mg total) by mouth daily. 1  tab daily for 2 days starting day after chemo with food Patient taking differently: Take 4 mg by mouth daily. As directed for 3 days starting day after chemo with food 04/01/17  Yes Nicholas Lose, MD  docusate sodium (COLACE) 100 MG capsule Take 100 mg by mouth 2 (two) times daily as needed for mild constipation.   Yes [provider]  hydrocortisone (ANUSOL-HC) 2.5 % rectal cream Place 1 application rectally 3 (three) times daily.   Yes [provider]  ibuprofen (ADVIL,MOTRIN) 200 MG tablet Take 400 mg by mouth every 8 (eight) hours as needed (for pain.).   Yes [provider]  lidocaine-prilocaine (EMLA) cream Apply to affected area once 04/01/17  Yes Nicholas Lose, MD  loratadine (CLARITIN) 10 MG tablet Take 10 mg by mouth daily as needed (for bone pain). For 2 days after chemo   Yes [provider]  LORazepam (ATIVAN) 0.5 MG tablet Take 1 tablet (0.5 mg total) by mouth at bedtime. Patient taking differently: Take 0.5 mg by mouth at bedtime as needed for sleep.  04/01/17  Yes Nicholas Lose, MD  Multiple Vitamin (MULTIVITAMIN WITH MINERALS) TABS tablet Take 1 tablet by mouth daily. doTerra Microplex VM2 (multivitamin)   Yes [provider]  Omega-3 Fatty Acids (OMEGA-3 PO) Take 2 capsules by mouth daily.  Yes [provider]  ondansetron (ZOFRAN) 8 MG tablet Take 1 tablet (8 mg total) by mouth 2 (two) times daily as needed. Start on the third day after chemotherapy. Patient taking differently: Take 8 mg by mouth 2 (two) times daily as needed for nausea. Start on the third day after chemotherapy. 04/01/17  Yes Nicholas Lose, MD  polyethylene glycol (MIRALAX / GLYCOLAX) packet Take 17 g by mouth daily as needed for moderate constipation.   Yes [provider]  prochlorperazine (COMPAZINE) 10 MG tablet Take 1 tablet (10 mg total) by mouth every 6 (six) hours as needed (Nausea or vomiting). 04/01/17  Yes Nicholas Lose, MD  senna (SENOKOT) 8.6  MG TABS tablet Take 1 tablet by mouth once.   Yes [provider]    Family History Family History  Problem Relation Age of Onset  . Pancreatic cancer Maternal Grandmother   . Lung cancer Paternal Grandmother   . Prostate cancer Paternal Grandfather   . Breast cancer Paternal Aunt   . Breast cancer Paternal Aunt     Social History Social History  Substance Use Topics  . Smoking status: Never Smoker  . Smokeless tobacco: Never Used  . Alcohol use Yes     Comment: occas     Allergies   No known allergies   Review of Systems Review of Systems  Constitutional: Positive for fever.  All other systems reviewed and are negative.    Physical Exam Updated Vital Signs BP 135/80 (BP Location: Left Arm)   Pulse (!) 117   Temp 100.1 F (37.8 C) (Oral)   Resp 20   Ht 5\' 3"  (1.6 m)   Wt 73.9 kg (163 lb)   SpO2 99%   BMI 28.87 kg/m   Physical Exam  Nursing note and vitals reviewed.  36 year old female, resting comfortably and in no acute distress. Vital signs are significant for tachycardia. Oxygen saturation is 99%, which is normal. Head is normocephalic and atraumatic. PERRLA, EOMI. Oropharynx is clear. Neck is nontender and supple without adenopathy or JVD. Back is nontender and there is no CVA tenderness. Lungs are clear without rales, wheezes, or rhonchi. Chest is nontender. Mediport is present on the left. Heart has regular rate and rhythm without murmur. Abdomen is soft, flat, nontender without masses or hepatosplenomegaly and peristalsis is normoactive. Extremities have no cyanosis or edema, full range of motion is present. Skin is warm and dry without rash. Neurologic: Mental status is normal, cranial nerves are intact, there are no motor or sensory deficits.  ED Treatments / Results  Labs (all labs ordered are listed, but only abnormal results are displayed) Labs Reviewed  COMPREHENSIVE METABOLIC PANEL - Abnormal; Notable for the following:        Result Value   Sodium 133 (*)    Glucose, Bld 119 (*)    Calcium 8.7 (*)    AST 12 (*)    All other components within normal limits  CBC WITH DIFFERENTIAL/PLATELET - Abnormal; Notable for the following:    WBC 0.3 (*)    RBC 3.12 (*)    Hemoglobin 9.8 (*)    HCT 27.8 (*)    Platelets 105 (*)    Neutro Abs 0.0 (*)    Lymphs Abs 0.2 (*)    All other components within normal limits  URINALYSIS, ROUTINE W REFLEX MICROSCOPIC - Abnormal; Notable for the following:    Ketones, ur 20 (*)    All other components within normal limits  I-STAT CG4 LACTIC ACID, ED  I-STAT BETA HCG BLOOD, ED ([facility], [facility], [facility] ONLY)  I-STAT CG4 LACTIC ACID, ED    Radiology Dg Chest Port 1 View  Result Date: 05/14/2017 CLINICAL DATA:  Fever since 05/11/2017. Currently undergoing chemotherapy for breast cancer. EXAM: PORTABLE CHEST 1 VIEW COMPARISON:  05/12/2017 FINDINGS: Power port type central venous catheter with tip over the low SVC region. No pneumothorax. Normal heart size and pulmonary vascularity. Lungs are clear. No airspace disease or consolidation. No blunting of costophrenic angles. IMPRESSION: No active disease. Electronically Signed   By: Lucienne Capers M.D.   On: 05/14/2017 03:54    Procedures Procedures (including critical care time) CRITICAL CARE Performed by: CHENI,DPOEU Total critical care time: 35 minutes Critical care time was exclusive of separately billable procedures and treating other patients. Critical care was necessary to treat or prevent imminent or life-threatening deterioration. Critical care was time spent personally by me on the following activities: development of treatment plan with patient and/or surrogate as well as nursing, discussions with consultants, evaluation of patient's response to treatment, examination of patient, obtaining history from patient or surrogate, ordering and performing treatments and interventions, ordering and review of laboratory studies, ordering and  review of radiographic studies, pulse oximetry and re-evaluation of patient's condition.  Medications Ordered in ED Medications  docusate sodium (COLACE) capsule 100 mg (not administered)  loratadine (CLARITIN) tablet 10 mg (not administered)  polyethylene glycol (MIRALAX / GLYCOLAX) packet 17 g (not administered)  senna (SENOKOT) tablet 8.6 mg (8.6 mg Oral Not Given 05/14/17 0345)  LORazepam (ATIVAN) tablet 0.5 mg (not administered)  prochlorperazine (COMPAZINE) tablet 10 mg (not administered)  multivitamin with minerals tablet 1 tablet (not administered)  acetaminophen (TYLENOL) tablet 650 mg (not administered)    Or  acetaminophen (TYLENOL) suppository 650 mg (not administered)  ondansetron (ZOFRAN) tablet 4 mg (not administered)    Or  ondansetron (ZOFRAN) injection 4 mg (not administered)  enoxaparin (LOVENOX) injection 40 mg (not administered)  0.9 %  sodium chloride infusion ( Intravenous New Bag/Given 05/14/17 0415)  ceFEPIme (MAXIPIME) 2 g in dextrose 5 % 50 mL IVPB (not administered)  vancomycin (VANCOCIN) IVPB 750 mg/150 ml premix (not administered)  nystatin (MYCOSTATIN) 100000 UNIT/ML suspension 500,000 Units (not administered)  vancomycin (VANCOCIN) IVPB 1000 mg/200 mL premix (1,000 mg Intravenous New Bag/Given 05/14/17 0415)  sodium chloride 0.9 % bolus 1,000 mL (1,000 mLs Intravenous New Bag/Given 05/14/17 0442)     Initial Impression / Assessment and Plan / ED Course  I have reviewed the triage vital signs and the nursing notes.  Pertinent labs & imaging results that were available during my care of the patient were reviewed by me and considered in my medical decision making (see chart for details).  Fever in a cancer patient without any localizing signs or symptoms. WBC has come back at 0.3 with absolute neutrophil count of 0. Lactic acid level is normal. Patient is nontoxic in appearance, but needs to be treated aggressively because of leukopenia. Old records were  reviewed confirming dose of doxorubicin and Cytoxan on September 25. WBC at that point was 5.5. I cannot find any record of what the antibiotic was administered yesterday was. She is started on vancomycin and Zosyn. Case is discussed with Dr. Hal [facility] of [facility] who agrees to admit the patient.  Final Clinical Impressions(s) / ED Diagnoses   Final diagnoses:  None    New Prescriptions New Prescriptions   No medications on file  Delora Fuel, MD 11/64/35 [phone]

## 2017-05-14 NOTE — ED Triage Notes (Signed)
Pt comes in with complaints of a fever that began on Sunday. Yesterday patient was seen at [facility] and chest x-ray was clear as well as urine and negative for flu and strep.  Pt febrile on assessment.  Diagnosed with breast cancer this year and is receiving active chemo.

## 2017-05-14 NOTE — Progress Notes (Signed)
Initial Nutrition Assessment  DOCUMENTATION CODES:   Not applicable  INTERVENTION:   - Ensure Enlive BID each supplement provides 350 kcals and 20 grams of protein  NUTRITION DIAGNOSIS:   Increased nutrient needs related to chronic illness (breast cancer) as evidenced by estimated needs.  GOAL:   Patient will meet greater than or equal to 90% of their needs   MONITOR:   PO intake, Weight trends, Labs, Supplement acceptance  REASON FOR ASSESSMENT:   Malnutrition Screening Tool    ASSESSMENT:   Pt is a 36 year old female admitted for febrile neutropenia. Pt currently has breast cancer and is undergoing chemotherapy at [facility] center. Her last chemo treatment was last week.   Pt consumed eggs, french toast, and fruit for breakfast. Yesterday pt ate cereal for breakfast, stir fry for lunch, and half of a [place] sandwich with a pear for dinner. Pt has been tolerating food well since admission but reports decreased appetite. PO intake at home has fluctuated based on treatment time.   Oral thrush is not causing pain or mouth sores. Pt reports water tasting metallic-like when consumed from a stainless steel cup. She now drinks water from only glass or plastic cups. Pt reports no other taste changes due to medication.  Pt reports a 5 lb weight loss since starting chemotherapy on 04/09/17 (3% weight loss). Per weight records in chart pt weighed 163 lbs on 05/14/17, 172 lbs on 03/27/17, and 180 lbs on 03/14/16. Pt denies abdominal pain or n/v since admission. Per H&P note, pt developed watery diarrhea and has had 4 episodes in an undisclosed time frame.   Pt is on NS at 125 mL/hour.  Medications: Lovenox, MVI  Labs: Glucose 107 (H), WBC 0.3 (L)  Nutrition Focused Physical Exam Findings: No muscle depletion or fat loss identified.  Diet Order:  Diet regular Room service appropriate? Yes; Fluid consistency: Thin  Skin:  Reviewed, no issues  Last BM:  05/14/17  Height:    Ht Readings from Last 1 Encounters:  05/14/17 5\' 3"  (1.6 m)    Weight:   Wt Readings from Last 1 Encounters:  05/14/17 163 lb 12.8 oz (74.3 kg)    Ideal Body Weight:  52.3 kg  BMI:  Body mass index is 29.02 kg/m.  Estimated Nutritional Needs:   Kcal:  2250-2450 kcals  Protein:  100-115 grams protein  Fluid:  >/= 2 L  EDUCATION NEEDS:   No education needs identified at this time  [place] Dietetic Intern Pager: [phone] 05/14/2017 3:12 PM

## 2017-05-15 LAB — CBC WITH DIFFERENTIAL/PLATELET
BASOS PCT: 4 %
Basophils Absolute: 0 10*3/uL (ref 0.0–0.1)
EOS PCT: 2 %
Eosinophils Absolute: 0 10*3/uL (ref 0.0–0.7)
HEMATOCRIT: 21.8 % — AB (ref 36.0–46.0)
HEMOGLOBIN: 7.8 g/dL — AB (ref 12.0–15.0)
Lymphocytes Relative: 51 %
Lymphs Abs: 0.3 10*3/uL — ABNORMAL LOW (ref 0.7–4.0)
MCH: 32 pg (ref 26.0–34.0)
MCHC: 35.8 g/dL (ref 30.0–36.0)
MCV: 89.3 fL (ref 78.0–100.0)
MONOS PCT: 16 %
Monocytes Absolute: 0.1 10*3/uL (ref 0.1–1.0)
NEUTROS PCT: 27 %
Neutro Abs: 0.1 10*3/uL — ABNORMAL LOW (ref 1.7–7.7)
Platelets: 92 10*3/uL — ABNORMAL LOW (ref 150–400)
RBC: 2.44 MIL/uL — AB (ref 3.87–5.11)
RDW: 13.5 % (ref 11.5–15.5)
WBC: 0.5 10*3/uL — AB (ref 4.0–10.5)

## 2017-05-15 LAB — URINE CULTURE: Culture: NO GROWTH

## 2017-05-15 MED ORDER — FILGRASTIM 480 MCG/1.6ML IJ SOLN
480.0000 ug | Freq: Once | INTRAMUSCULAR | Status: AC
  Administered 2017-05-15: 480 ug via SUBCUTANEOUS
  Filled 2017-05-15: qty 1.6

## 2017-05-15 MED ORDER — BOOST / RESOURCE BREEZE PO LIQD
1.0000 | Freq: Two times a day (BID) | ORAL | Status: DC
  Administered 2017-05-15 – 2017-05-19 (×3): 1 via ORAL

## 2017-05-15 MED ORDER — HYDROCORTISONE ACETATE 25 MG RE SUPP
25.0000 mg | Freq: Two times a day (BID) | RECTAL | Status: DC
  Administered 2017-05-15 – 2017-05-17 (×5): 25 mg via RECTAL
  Filled 2017-05-15 (×5): qty 1

## 2017-05-15 MED ORDER — ENSURE ENLIVE PO LIQD
237.0000 mL | Freq: Two times a day (BID) | ORAL | Status: DC
  Administered 2017-05-15: 237 mL via ORAL

## 2017-05-15 NOTE — Care Management Note (Signed)
Case Management Note  Patient Details  Name: Ana Wu MRN: [id number] Date of Birth: 05/02/1981  Subjective/Objective:  36 y/o f admitted w/Febrile neutropenia. From home.                  Action/Plan:d/c plan home.   Expected Discharge Date:                  Expected Discharge Plan:  Home/Self Care  In-House Referral:     Discharge planning Services  CM Consult  Post Acute Care Choice:    Choice offered to:     DME Arranged:    DME Agency:     HH Arranged:    HH Agency:     Status of Service:  In process, will continue to follow  If discussed at Long Length of Stay Meetings, dates discussed:    Additional Comments:  Dessa Phi, RN 05/15/2017, 12:58 PM

## 2017-05-15 NOTE — Progress Notes (Signed)
Chaplain following due to spiritual care consult from RN.   Pt was asleep on chaplain arrival.  Due to pt being admitted through ED after midnight last night, Chaplain elected not to wake patient.  Will follow up for assessment and support.    [facility] / [place], MDiv

## 2017-05-15 NOTE — Progress Notes (Signed)
CRITICAL VALUE ALERT  Critical Value:  WBC  Date & Time Notied:  7:31 PM 05/15/17   Provider Notified: Kennon Holter  Orders Received/Actions taken: On call [place]ount notified

## 2017-05-15 NOTE — Progress Notes (Addendum)
PROGRESS NOTE  Ana Wu  TKP:[id number] DOB: 1980-08-20 DOA: 05/14/2017  PCP: Leighton Ruff, MD   Brief Narrative:  36 y.o. female with breast cancer on chemotherapy, last chemotherapy was last week and [facility] presents to the ER with complaints of persistent fever and chills. Patient has been having these symptoms 2-3 days. Patient had gone to her PCP 2 days ago and was prescribed antibiotics. Patient also noticed some oral thrush and also has started developing watery diarrhea. Denies any abdominal pain nausea vomiting. Despite taking the oral antibiotics patient was still febrile and presented to the ER.   Assessment & Plan:   Principal Problem:   Febrile neutropenia ([place]), pancytopenia, sepsis - chemotherapy related - no clear infectious etiology identified at this time - will keep on Vancomycin and Cefepime day #2 - WBC still low, ? For onc if we can give Neupogen - repeat CBC in AM - follow up on blood cultures and narrow down ABX regimen as clinically indicated     Diarrhea - suspect post chemo related - GI stool panel clear and C. Diff negative   Active Problems:   Malignant neoplasm of lower-outer quadrant of right breast of female, estrogen receptor positive ([place]) - notified pt's oncologist  DVT prophylaxis: Lovenox SQ Code Status: Full  Family Communication: Patient at bedside  Disposition Plan: to be determined   Consultants:   None  Procedures:   None  Antimicrobials:   Vancomycin 10/03 -->  Cefepime 10/03 -->  Subjective: Reports feeling better, less diarrhea.   Objective: Vitals:   05/14/17 2059 05/14/17 2200 05/14/17 2311 05/15/17 0555  BP: 124/65   106/61  Pulse: 98   87  Resp: 18   18  Temp: (!) 102 F (38.9 C) (!) 100.9 F (38.3 C) 98.6 F (37 C) 99.7 F (37.6 C)  TempSrc: Oral Axillary Axillary Oral  SpO2: 100%   98%  Weight:      Height:        Intake/Output Summary (Last 24 hours) at  05/15/17 1730 Last data filed at 05/15/17 0900  Gross per 24 hour  Intake             1940 ml  Output                0 ml  Net             1940 ml   Filed Weights   05/14/17 0009 05/14/17 0546  Weight: 73.9 kg (163 lb) 74.3 kg (163 lb 12.8 oz)    Examination:  General exam: Appears calm and comfortable  Respiratory system: Clear to auscultation. Respiratory effort normal. Cardiovascular system: S1 & S2 heard, RRR. No JVD, murmurs, rubs, gallops or clicks. No pedal edema. Gastrointestinal system: Abdomen is nondistended, soft and nontender. No organomegaly or masses felt. Normal bowel sounds heard. Central nervous system: Alert and oriented. No focal neurological deficits. Extremities: Symmetric 5 x 5 power. Skin: No rashes, lesions or ulcers Psychiatry: Judgement and insight appear normal. Mood & affect appropriate.    Data Reviewed: I have personally reviewed following labs and imaging studies  CBC:  Recent Labs Lab 05/14/17 0040 05/14/17 0500  WBC 0.3* 0.3*  NEUTROABS 0.0*  --   HGB 9.8* 11.1*  HCT 27.8* 31.9*  MCV 89.1 89.1  PLT 105* 517*   Basic Metabolic Panel:  Recent Labs Lab 05/14/17 0040 05/14/17 0500  NA 133* 136  K 3.6 3.5  CL 101 102  CO2 23 25  GLUCOSE 119* 107*  BUN 11 9  CREATININE 0.59 0.60  CALCIUM 8.7* 9.0   Liver Function Tests:  Recent Labs Lab 05/14/17 0040  AST 12*  ALT 14  ALKPHOS 41  BILITOT 0.6  PROT 7.0  ALBUMIN 3.8   Urine analysis:    Component Value Date/Time   COLORURINE YELLOW 05/14/2017 0012   APPEARANCEUR CLEAR 05/14/2017 0012   LABSPEC 1.011 05/14/2017 0012   PHURINE 7.0 05/14/2017 0012   GLUCOSEU NEGATIVE 05/14/2017 0012   HGBUR NEGATIVE 05/14/2017 0012   BILIRUBINUR NEGATIVE 05/14/2017 0012   KETONESUR 20 (A) 05/14/2017 0012   PROTEINUR NEGATIVE 05/14/2017 0012   NITRITE NEGATIVE 05/14/2017 0012   LEUKOCYTESUR NEGATIVE 05/14/2017 0012   Recent Results (from the past 240 hour(s))  Culture, blood  (routine x 2)     Status: None (Preliminary result)   Collection Time: 05/14/17  3:06 AM  Result Value Ref Range Status   Specimen Description BLOOD PORTA CATH  Final   Special Requests   Final    BOTTLES DRAWN AEROBIC AND ANAEROBIC Blood Culture adequate volume   Culture   Final    NO GROWTH 1 DAY Performed at [facility], [address] [place], [place] [address]    Report Status PENDING  Incomplete  Urine culture     Status: None   Collection Time: 05/14/17  3:06 AM  Result Value Ref Range Status   Specimen Description URINE, CLEAN CATCH  Final   Special Requests NONE  Final   Culture   Final    NO GROWTH Performed at [facility], [place] [address], [place], [place] [address]    Report Status 05/15/2017 FINAL  Final  Culture, blood (routine x 2)     Status: None (Preliminary result)   Collection Time: 05/14/17  3:11 AM  Result Value Ref Range Status   Specimen Description BLOOD RIGHT ANTECUBITAL  Final   Special Requests   Final    BOTTLES DRAWN AEROBIC AND ANAEROBIC Blood Culture adequate volume   Culture   Final    NO GROWTH 1 DAY Performed at [facility], [place] [address], [place], [place] [address]    Report Status PENDING  Incomplete  Culture, blood (x 2)     Status: None (Preliminary result)   Collection Time: 05/14/17  6:51 AM  Result Value Ref Range Status   Specimen Description BLOOD RIGHT ARM  Final   Special Requests   Final    BOTTLES DRAWN AEROBIC AND ANAEROBIC Blood Culture adequate volume   Culture   Final    NO GROWTH 1 DAY Performed at [facility], [place] [address] [place], [place] [address]    Report Status PENDING  Incomplete  Culture, blood (x 2)     Status: None (Preliminary result)   Collection Time: 05/14/17  6:51 AM  Result Value Ref Range Status   Specimen Description BLOOD LEFT ARM  Final   Special Requests IN PEDIATRIC BOTTLE Blood Culture adequate volume  Final   Culture   Final    NO GROWTH 1 DAY Performed at  [facility], [place] [address] [place], [place] [address]    Report Status PENDING  Incomplete  C difficile quick scan w PCR reflex     Status: None   Collection Time: 05/14/17 10:00 AM  Result Value Ref Range Status   C Diff antigen NEGATIVE NEGATIVE Final   C Diff toxin  NEGATIVE NEGATIVE Final   C Diff interpretation No C. difficile detected.  Final  Gastrointestinal Panel by PCR , Stool     Status: None   Collection Time: 05/14/17 10:00 AM  Result Value Ref Range Status   Campylobacter species NOT DETECTED NOT DETECTED Final   Plesimonas shigelloides NOT DETECTED NOT DETECTED Final   Salmonella species NOT DETECTED NOT DETECTED Final   Yersinia enterocolitica NOT DETECTED NOT DETECTED Final   Vibrio species NOT DETECTED NOT DETECTED Final   Vibrio cholerae NOT DETECTED NOT DETECTED Final   Enteroaggregative E coli (EAEC) NOT DETECTED NOT DETECTED Final   Enteropathogenic E coli (EPEC) NOT DETECTED NOT DETECTED Final   Enterotoxigenic E coli (ETEC) NOT DETECTED NOT DETECTED Final   Shiga like toxin producing E coli (STEC) NOT DETECTED NOT DETECTED Final   Shigella/Enteroinvasive E coli (EIEC) NOT DETECTED NOT DETECTED Final   Cryptosporidium NOT DETECTED NOT DETECTED Final   Cyclospora cayetanensis NOT DETECTED NOT DETECTED Final   Entamoeba histolytica NOT DETECTED NOT DETECTED Final   Giardia lamblia NOT DETECTED NOT DETECTED Final   Adenovirus F40/41 NOT DETECTED NOT DETECTED Final   Astrovirus NOT DETECTED NOT DETECTED Final   Norovirus GI/GII NOT DETECTED NOT DETECTED Final   Rotavirus A NOT DETECTED NOT DETECTED Final   Sapovirus (I, II, IV, and V) NOT DETECTED NOT DETECTED Final    Radiology Studies: Dg Chest Port 1 View  Result Date: 05/14/2017 CLINICAL DATA:  Fever since 05/11/2017. Currently undergoing chemotherapy for breast cancer. EXAM: PORTABLE CHEST 1 VIEW COMPARISON:  05/12/2017 FINDINGS: Power port type central venous catheter with tip over the low SVC  region. No pneumothorax. Normal heart size and pulmonary vascularity. Lungs are clear. No airspace disease or consolidation. No blunting of costophrenic angles. IMPRESSION: No active disease. Electronically Signed   By: Lucienne Capers M.D.   On: 05/14/2017 03:54   Scheduled Meds: . enoxaparin (LOVENOX) injection  40 mg Subcutaneous Q24H  . feeding supplement  1 Container Oral BID BM  . hydrocortisone  25 mg Rectal BID  . multivitamin with minerals  1 tablet Oral Daily  . nystatin  5 mL Oral QID   Continuous Infusions: . ceFEPime (MAXIPIME) IV Stopped (05/15/17 1445)  . vancomycin Stopped (05/15/17 1600)     LOS: 1 day   Time spent: 25 minutes   Faye Ramsay, MD [facility] Pager [phone]  If 7PM-7AM, please contact night-coverage [email or web address] Password [email or web address] 05/15/2017, 5:30 PM

## 2017-05-16 DIAGNOSIS — T451X5A Adverse effect of antineoplastic and immunosuppressive drugs, initial encounter: Secondary | ICD-10-CM

## 2017-05-16 DIAGNOSIS — Z170 Estrogen receptor positive status [ER+]: Secondary | ICD-10-CM

## 2017-05-16 DIAGNOSIS — C50511 Malignant neoplasm of lower-outer quadrant of right female breast: Secondary | ICD-10-CM

## 2017-05-16 DIAGNOSIS — D61810 Antineoplastic chemotherapy induced pancytopenia: Secondary | ICD-10-CM

## 2017-05-16 LAB — BASIC METABOLIC PANEL
ANION GAP: 7 (ref 5–15)
BUN: 5 mg/dL — AB (ref 6–20)
CO2: 27 mmol/L (ref 22–32)
Calcium: 8.8 mg/dL — ABNORMAL LOW (ref 8.9–10.3)
Chloride: 107 mmol/L (ref 101–111)
Creatinine, Ser: 0.47 mg/dL (ref 0.44–1.00)
GFR calc Af Amer: >60 mL/min (ref >60)
GFR calc non Af Amer: >60 mL/min (ref >60)
GLUCOSE: 104 mg/dL — AB (ref 65–99)
Potassium: 3.4 mmol/L — ABNORMAL LOW (ref 3.5–5.1)
Sodium: 141 mmol/L (ref 135–145)

## 2017-05-16 LAB — VANCOMYCIN, TROUGH: Vancomycin Tr: 9 ug/mL — ABNORMAL LOW (ref 15–20)

## 2017-05-16 MED ORDER — VANCOMYCIN HCL 10 G IV SOLR
1250.0000 mg | Freq: Three times a day (TID) | INTRAVENOUS | Status: DC
  Administered 2017-05-16 – 2017-05-17 (×4): 1250 mg via INTRAVENOUS
  Filled 2017-05-16 (×5): qty 1250

## 2017-05-16 NOTE — Progress Notes (Signed)
PROGRESS NOTE  Ana Wu  YOV:[id number] DOB: 09-09-1980 DOA: 05/14/2017  PCP: Leighton Ruff, MD   Brief Narrative:  36 y.o. female with breast cancer on chemotherapy, last chemotherapy was last week and [facility] presents to the ER with complaints of persistent fever and chills. Patient has been having these symptoms 2-3 days. Patient had gone to her PCP 2 days ago and was prescribed antibiotics. Patient also noticed some oral thrush and also has started developing watery diarrhea. Denies any abdominal pain nausea vomiting. Despite taking the oral antibiotics patient was still febrile and presented to the ER.   Assessment & Plan:   Febrile neutropenia: Due to neoadjuvant chemotherapy but no clear infectious source on exam or work up to date. No GI pathogens isolated. ?viral URI with very mild symptoms.  - Continue broad antibacterial coverage with vancomycin, cefepime. If still febrile at 5 days, will add antifungal coverage.  - Hoping to discuss G-CSF stim use with oncology.  - Monitor CBC and culture data.   Diarrhea: possibly related to chemo and laxatives taken for constipation/hemorrhoids. No GI pathogens or C. diff on evaluation. Resolved.   Malignant neoplasm of lower-outer quadrant of right breast of female, estrogen receptor positive: Actively undergoing neoadjuvant chemotherapy with plans for surgery Jan 2019 followed by radiation.  - Attempted to contact pt's oncologist at [facility], Deitra Mayo, will await call back.   DVT prophylaxis: Lovenox SQ Code Status: Full  Family Communication: None at bedside Disposition Plan: Home once improving.    Consultants:   None  Procedures:   None  Antimicrobials:   Vancomycin 10/03 -->  Cefepime 10/03 -->  Subjective: Diarrhea resolved, was taking a lot of laxatives for constipation. Had a fever overnight and had maybe some mild runny nose associated with postnasal drip and malaise, otherwise  no other symptoms.   Objective: Vitals:   05/15/17 1926 05/15/17 2108 05/16/17 0501 05/16/17 1424  BP:  131/68 (!) 126/54 (!) 118/58  Pulse:  (!) 106 78 86  Resp:  20 20 20   Temp: (!) 101.2 F (38.4 C) 99.5 F (37.5 C) 98.8 F (37.1 C) 98.5 F (36.9 C)  TempSrc: Oral Oral Oral Oral  SpO2:  100% 100% 100%  Weight:      Height:       Gen: Well-appearing 36 y.o.female in NAD HEENT: MMM, posterior oropharynx clear Pulm: Non-labored; CTAB, no wheezes  CV: Regular rate, no murmur appreciated; distal pulses intact/symmetric GI: + BS; soft, non-tender, non-distended Skin: No rashes, wounds, ulcers. Left upper chest port c/d/i Neuro: A&Ox3, CN II-XII without deficits   CBC:  Recent Labs Lab 05/14/17 0040 05/14/17 0500 05/15/17 1833  WBC 0.3* 0.3* 0.5*  NEUTROABS 0.0*  --  0.1*  HGB 9.8* 11.1* 7.8*  HCT 27.8* 31.9* 21.8*  MCV 89.1 89.1 89.3  PLT 105* 110* 92*   Basic Metabolic Panel:  Recent Labs Lab 05/14/17 0040 05/14/17 0500 05/16/17 0622  NA 133* 136 141  K 3.6 3.5 3.4*  CL 101 102 107  CO2 23 25 27   GLUCOSE 119* 107* 104*  BUN 11 9 5*  CREATININE 0.59 0.60 0.47  CALCIUM 8.7* 9.0 8.8*   Liver Function Tests:  Recent Labs Lab 05/14/17 0040  AST 12*  ALT 14  ALKPHOS 41  BILITOT 0.6  PROT 7.0  ALBUMIN 3.8   Urine analysis:    Component Value Date/Time   COLORURINE YELLOW 05/14/2017 0012   APPEARANCEUR CLEAR 05/14/2017 0012  LABSPEC 1.011 05/14/2017 0012   PHURINE 7.0 05/14/2017 0012   GLUCOSEU NEGATIVE 05/14/2017 0012   HGBUR NEGATIVE 05/14/2017 0012   BILIRUBINUR NEGATIVE 05/14/2017 0012   KETONESUR 20 (A) 05/14/2017 0012   PROTEINUR NEGATIVE 05/14/2017 0012   NITRITE NEGATIVE 05/14/2017 0012   LEUKOCYTESUR NEGATIVE 05/14/2017 0012   Recent Results (from the past 240 hour(s))  Culture, blood (routine x 2)     Status: None (Preliminary result)   Collection Time: 05/14/17  3:06 AM  Result Value Ref Range Status   Specimen Description BLOOD  PORTA CATH  Final   Special Requests   Final    BOTTLES DRAWN AEROBIC AND ANAEROBIC Blood Culture adequate volume   Culture   Final    NO GROWTH 2 DAYS Performed at [facility], [address], [place], [place] [address]    Report Status PENDING  Incomplete  Urine culture     Status: None   Collection Time: 05/14/17  3:06 AM  Result Value Ref Range Status   Specimen Description URINE, CLEAN CATCH  Final   Special Requests NONE  Final   Culture   Final    NO GROWTH Performed at [facility], [place] [address], [place], [place] [address]    Report Status 05/15/2017 FINAL  Final  Culture, blood (routine x 2)     Status: None (Preliminary result)   Collection Time: 05/14/17  3:11 AM  Result Value Ref Range Status   Specimen Description BLOOD RIGHT ANTECUBITAL  Final   Special Requests   Final    BOTTLES DRAWN AEROBIC AND ANAEROBIC Blood Culture adequate volume   Culture   Final    NO GROWTH 2 DAYS Performed at [facility], [place] [address] [place], [place] [address]    Report Status PENDING  Incomplete  Culture, blood (x 2)     Status: None (Preliminary result)   Collection Time: 05/14/17  6:51 AM  Result Value Ref Range Status   Specimen Description BLOOD RIGHT ARM  Final   Special Requests   Final    BOTTLES DRAWN AEROBIC AND ANAEROBIC Blood Culture adequate volume   Culture   Final    NO GROWTH 2 DAYS Performed at [facility], [address], [place], [place] [address]    Report Status PENDING  Incomplete  Culture, blood (x 2)     Status: None (Preliminary result)   Collection Time: 05/14/17  6:51 AM  Result Value Ref Range Status   Specimen Description BLOOD LEFT ARM  Final   Special Requests IN PEDIATRIC BOTTLE Blood Culture adequate volume  Final   Culture   Final    NO GROWTH 2 DAYS Performed at [facility], [place] [address], [place], [place] [address]    Report Status PENDING  Incomplete  C difficile quick scan w PCR reflex     Status:  None   Collection Time: 05/14/17 10:00 AM  Result Value Ref Range Status   C Diff antigen NEGATIVE NEGATIVE Final   C Diff toxin NEGATIVE NEGATIVE Final   C Diff interpretation No C. difficile detected.  Final  Gastrointestinal Panel by PCR , Stool     Status: None   Collection Time: 05/14/17 10:00 AM  Result Value Ref Range Status   Campylobacter species NOT DETECTED NOT DETECTED Final   Plesimonas shigelloides NOT DETECTED NOT DETECTED Final   Salmonella species NOT DETECTED NOT DETECTED Final   Yersinia enterocolitica NOT  DETECTED NOT DETECTED Final   Vibrio species NOT DETECTED NOT DETECTED Final   Vibrio cholerae NOT DETECTED NOT DETECTED Final   Enteroaggregative E coli (EAEC) NOT DETECTED NOT DETECTED Final   Enteropathogenic E coli (EPEC) NOT DETECTED NOT DETECTED Final   Enterotoxigenic E coli (ETEC) NOT DETECTED NOT DETECTED Final   Shiga like toxin producing E coli (STEC) NOT DETECTED NOT DETECTED Final   Shigella/Enteroinvasive E coli (EIEC) NOT DETECTED NOT DETECTED Final   Cryptosporidium NOT DETECTED NOT DETECTED Final   Cyclospora cayetanensis NOT DETECTED NOT DETECTED Final   Entamoeba histolytica NOT DETECTED NOT DETECTED Final   Giardia lamblia NOT DETECTED NOT DETECTED Final   Adenovirus F40/41 NOT DETECTED NOT DETECTED Final   Astrovirus NOT DETECTED NOT DETECTED Final   Norovirus GI/GII NOT DETECTED NOT DETECTED Final   Rotavirus A NOT DETECTED NOT DETECTED Final   Sapovirus (I, II, IV, and V) NOT DETECTED NOT DETECTED Final    Radiology Studies: No results found. Scheduled Meds: . enoxaparin (LOVENOX) injection  40 mg Subcutaneous Q24H  . feeding supplement  1 Container Oral BID BM  . hydrocortisone  25 mg Rectal BID  . multivitamin with minerals  1 tablet Oral Daily  . nystatin  5 mL Oral QID   Continuous Infusions: . ceFEPime (MAXIPIME) IV Stopped (05/16/17 1430)  . vancomycin Stopped (05/16/17 1431)     LOS: 2 days   Time spent: 25 minutes    Vance Gather, MD [facility] Pager [phone]   If 7PM-7AM, please contact night-coverage [email or web address] Password [email or web address] 05/16/2017, 7:57 PM

## 2017-05-16 NOTE — Progress Notes (Signed)
Pharmacy Antibiotic Note  Ana Wu is a 36 y.o. female with hx of Breast Ca currently undergoing chemo admitted on 05/14/2017 with febrile neutropenia.  Pharmacy has been consulted for cefepime and vancomycin dosing.  Day #3 Vancomycin and Cefepime.  Still with fever yesterday evening. WBC and ANC with some improvement. Remains neutropenic. No CBC today.  SCr stable.  Vancomycin trough low with 750 mg q8h dosing.  No infectious source identified.  Nystatin ordered for oral thrush.  Plan: Adjust Vancomycin to 1250mg  IV q8h. Continue Cefepime 2g IV q8h. Daily SCr.  Height: 5\' 3"  (160 cm) Weight: 163 lb 12.8 oz (74.3 kg) IBW/kg (Calculated) : 52.4  Temp (24hrs), Avg:99.5 F (37.5 C), Min:98.5 F (36.9 C), Max:101.2 F (38.4 C)   Recent Labs Lab 05/14/17 0040 05/14/17 0046 05/14/17 0500 05/15/17 1833 05/16/17 0622  WBC 0.3*  --  0.3* 0.5*  --   CREATININE 0.59  --  0.60  --  0.47  LATICACIDVEN  --  0.55  --   --   --   VANCOTROUGH  --   --   --   --  9*    Estimated Creatinine Clearance: 93 mL/min (by C-G formula based on SCr of 0.47 mg/dL).    Allergies  Allergen Reactions  . No Known Allergies     Antimicrobials this admission: 10/3 cefepime >>  10/3 vancomycin >>   Dose adjustments this admission: 10/5 0530 VT = 9 mcg/mL on 750mg  q8h (prior to 6th dose)  Microbiology results: 10/3 C. Diff: neg/neg 10/3 GIP: neg 10/3 BCx (0300):ngtd 10/3 BCx (0700): ngtd 10/3 Ucx: NG  Thank you for allowing pharmacy to be a part of this patient's care.  Hershal Coria 05/16/2017 8:50 AM

## 2017-05-17 DIAGNOSIS — D709 Neutropenia, unspecified: Secondary | ICD-10-CM

## 2017-05-17 DIAGNOSIS — D649 Anemia, unspecified: Secondary | ICD-10-CM

## 2017-05-17 DIAGNOSIS — C50919 Malignant neoplasm of unspecified site of unspecified female breast: Secondary | ICD-10-CM

## 2017-05-17 DIAGNOSIS — R5081 Fever presenting with conditions classified elsewhere: Secondary | ICD-10-CM

## 2017-05-17 DIAGNOSIS — D702 Other drug-induced agranulocytosis: Secondary | ICD-10-CM

## 2017-05-17 DIAGNOSIS — D696 Thrombocytopenia, unspecified: Secondary | ICD-10-CM

## 2017-05-17 LAB — CBC WITH DIFFERENTIAL/PLATELET
BASOS PCT: 4 %
Band Neutrophils: 12 %
Basophils Absolute: 0 10*3/uL (ref 0.0–0.1)
Blasts: 0 %
Eosinophils Absolute: 0 10*3/uL (ref 0.0–0.7)
Eosinophils Relative: 0 %
HCT: 21.5 % — ABNORMAL LOW (ref 36.0–46.0)
HEMOGLOBIN: 7.6 g/dL — AB (ref 12.0–15.0)
Lymphocytes Relative: 48 %
Lymphs Abs: 0.5 10*3/uL — ABNORMAL LOW (ref 0.7–4.0)
MCH: 32.1 pg (ref 26.0–34.0)
MCHC: 35.3 g/dL (ref 30.0–36.0)
MCV: 90.7 fL (ref 78.0–100.0)
MONO ABS: 0.2 10*3/uL (ref 0.1–1.0)
MYELOCYTES: 0 %
Metamyelocytes Relative: 8 %
Monocytes Relative: 16 %
NEUTROS PCT: 12 %
NRBC: 0 /100{WBCs}
Neutro Abs: 0.4 10*3/uL — ABNORMAL LOW (ref 1.7–7.7)
Other: 0 %
PROMYELOCYTES ABS: 0 %
Platelets: 105 10*3/uL — ABNORMAL LOW (ref 150–400)
RBC: 2.37 MIL/uL — AB (ref 3.87–5.11)
RDW: 13.6 % (ref 11.5–15.5)
WBC: 1.1 10*3/uL — AB (ref 4.0–10.5)

## 2017-05-17 LAB — CREATININE, SERUM
CREATININE: 0.5 mg/dL (ref 0.44–1.00)
GFR calc Af Amer: >60 mL/min (ref >60)

## 2017-05-17 MED ORDER — TBO-FILGRASTIM 480 MCG/0.8ML ~~LOC~~ SOSY
480.0000 ug | PREFILLED_SYRINGE | Freq: Every day | SUBCUTANEOUS | Status: DC
  Administered 2017-05-17: 480 ug via SUBCUTANEOUS
  Filled 2017-05-17: qty 0.8

## 2017-05-17 NOTE — Progress Notes (Addendum)
PROGRESS NOTE  Ana Wu  GQQ:[id number] DOB: 09-24-1980 DOA: 05/14/2017  PCP: Leighton Ruff, MD   Brief Narrative:  36 y.o. female with breast cancer on chemotherapy, last chemotherapy was last week and [facility] presents to the ER with complaints of persistent fever and chills. Patient has been having these symptoms 2-3 days. Patient had gone to her PCP 2 days ago and was prescribed antibiotics. Patient also noticed some oral thrush and also has started developing watery diarrhea. Denies any abdominal pain nausea vomiting. Despite taking the oral antibiotics patient was still febrile and presented to the ER.   Assessment & Plan: Febrile neutropenia: Due to neoadjuvant chemotherapy but no clear infectious source on exam or work up to date. No GI pathogens isolated. ?viral URI with very mild symptoms.  - Continue broad antibacterial coverage with vancomycin, cefepime. If still febrile at 5 days, will add antifungal coverage.  - GCSF given 10/4.  - Monitor CBC and culture data.   Pancytopenia: Acutely dropped hgb with stable plt's without evidence of bleeding. Suspect inadequate marrow response.  - Monitor closely, feel that DVT ppx is still indicated.   Diarrhea: possibly related to chemo and laxatives taken for constipation/hemorrhoids. No GI pathogens or C. diff on evaluation. Resolved.   Malignant neoplasm of lower-outer quadrant of right breast of female, estrogen receptor positive: Actively undergoing neoadjuvant chemotherapy with plans for surgery Jan 2019 followed by radiation.  - Attempted to contact pt's oncologist at [facility], Dr. Deitra Mayo, no return call yet. - I've discussed with Dr. Alvy Bimler who will evaluate the patient.   DVT prophylaxis: Lovenox SQ Code Status: Full  Family Communication: None at bedside Disposition Plan: Home once improving.    Consultants:   Oncology, Dr. Alvy Bimler  Procedures:   None  Antimicrobials:    Vancomycin 10/03 -->  Cefepime 10/03 -->  Subjective: No fevers overnight. Has mild generalized headache without weakness, numbness, photophobia, neck stiffness.   Objective: Vitals:   05/16/17 0501 05/16/17 1424 05/16/17 2025 05/17/17 0442  BP: (!) 126/54 (!) 118/58 135/77 (!) 115/54  Pulse: 78 86 85 78  Resp: 20 20 18 18   Temp: 98.8 F (37.1 C) 98.5 F (36.9 C) 98.5 F (36.9 C) 99.6 F (37.6 C)  TempSrc: Oral Oral Oral Oral  SpO2: 100% 100% 100% 97%  Weight:      Height:       Gen: Well-appearing 36 y.o.female in NAD HEENT: MMM, posterior oropharynx clear Pulm: Non-labored; CTAB, no wheezes  CV: Regular rate, no murmur appreciated; distal pulses intact/symmetric GI: + BS; soft, non-tender, non-distended Skin: No rashes, wounds, ulcers. Left upper chest port c/d/i. No bleeding or bruising. Neuro: A&Ox3, CN II-XII without deficits   CBC:  Recent Labs Lab 05/14/17 0040 05/14/17 0500 05/15/17 1833 05/17/17 0343  WBC 0.3* 0.3* 0.5* 1.1*  NEUTROABS 0.0*  --  0.1* 0.4*  HGB 9.8* 11.1* 7.8* 7.6*  HCT 27.8* 31.9* 21.8* 21.5*  MCV 89.1 89.1 89.3 90.7  PLT 105* 110* 92* 671*   Basic Metabolic Panel:  Recent Labs Lab 05/14/17 0040 05/14/17 0500 05/16/17 0622 05/17/17 0343  NA 133* 136 141  --   K 3.6 3.5 3.4*  --   CL 101 102 107  --   CO2 23 25 27   --   GLUCOSE 119* 107* 104*  --   BUN 11 9 5*  --   CREATININE 0.59 0.60 0.47 0.50  CALCIUM 8.7* 9.0 8.8*  --  Liver Function Tests:  Recent Labs Lab 05/14/17 0040  AST 12*  ALT 14  ALKPHOS 41  BILITOT 0.6  PROT 7.0  ALBUMIN 3.8   Urine analysis:    Component Value Date/Time   COLORURINE YELLOW 05/14/2017 0012   APPEARANCEUR CLEAR 05/14/2017 0012   LABSPEC 1.011 05/14/2017 0012   PHURINE 7.0 05/14/2017 0012   GLUCOSEU NEGATIVE 05/14/2017 0012   HGBUR NEGATIVE 05/14/2017 0012   BILIRUBINUR NEGATIVE 05/14/2017 0012   KETONESUR 20 (A) 05/14/2017 0012   PROTEINUR NEGATIVE 05/14/2017 0012    NITRITE NEGATIVE 05/14/2017 0012   LEUKOCYTESUR NEGATIVE 05/14/2017 0012   Recent Results (from the past 240 hour(s))  Culture, blood (routine x 2)     Status: None (Preliminary result)   Collection Time: 05/14/17  3:06 AM  Result Value Ref Range Status   Specimen Description BLOOD PORTA CATH  Final   Special Requests   Final    BOTTLES DRAWN AEROBIC AND ANAEROBIC Blood Culture adequate volume   Culture   Final    NO GROWTH 2 DAYS Performed at [facility], [address] [place], [place] [address]    Report Status PENDING  Incomplete  Urine culture     Status: None   Collection Time: 05/14/17  3:06 AM  Result Value Ref Range Status   Specimen Description URINE, CLEAN CATCH  Final   Special Requests NONE  Final   Culture   Final    NO GROWTH Performed at [facility], [place] [address] [place], [place] [address]    Report Status 05/15/2017 FINAL  Final  Culture, blood (routine x 2)     Status: None (Preliminary result)   Collection Time: 05/14/17  3:11 AM  Result Value Ref Range Status   Specimen Description BLOOD RIGHT ANTECUBITAL  Final   Special Requests   Final    BOTTLES DRAWN AEROBIC AND ANAEROBIC Blood Culture adequate volume   Culture   Final    NO GROWTH 2 DAYS Performed at [facility], [place] [address], [place], [place] [address]    Report Status PENDING  Incomplete  Culture, blood (x 2)     Status: None (Preliminary result)   Collection Time: 05/14/17  6:51 AM  Result Value Ref Range Status   Specimen Description BLOOD RIGHT ARM  Final   Special Requests   Final    BOTTLES DRAWN AEROBIC AND ANAEROBIC Blood Culture adequate volume   Culture   Final    NO GROWTH 2 DAYS Performed at [facility], [address] [place], [place] [address]    Report Status PENDING  Incomplete  Culture, blood (x 2)     Status: None (Preliminary result)   Collection Time: 05/14/17  6:51 AM  Result Value Ref Range Status   Specimen Description BLOOD LEFT ARM   Final   Special Requests IN PEDIATRIC BOTTLE Blood Culture adequate volume  Final   Culture   Final    NO GROWTH 2 DAYS Performed at [facility], [place] [address] [place], [place] [address]    Report Status PENDING  Incomplete  C difficile quick scan w PCR reflex     Status: None   Collection Time: 05/14/17 10:00 AM  Result Value Ref Range Status   C Diff antigen NEGATIVE NEGATIVE Final   C Diff toxin NEGATIVE NEGATIVE Final   C Diff interpretation No C. difficile detected.  Final  Gastrointestinal Panel by PCR , Stool  Status: None   Collection Time: 05/14/17 10:00 AM  Result Value Ref Range Status   Campylobacter species NOT DETECTED NOT DETECTED Final   Plesimonas shigelloides NOT DETECTED NOT DETECTED Final   Salmonella species NOT DETECTED NOT DETECTED Final   Yersinia enterocolitica NOT DETECTED NOT DETECTED Final   Vibrio species NOT DETECTED NOT DETECTED Final   Vibrio cholerae NOT DETECTED NOT DETECTED Final   Enteroaggregative E coli (EAEC) NOT DETECTED NOT DETECTED Final   Enteropathogenic E coli (EPEC) NOT DETECTED NOT DETECTED Final   Enterotoxigenic E coli (ETEC) NOT DETECTED NOT DETECTED Final   Shiga like toxin producing E coli (STEC) NOT DETECTED NOT DETECTED Final   Shigella/Enteroinvasive E coli (EIEC) NOT DETECTED NOT DETECTED Final   Cryptosporidium NOT DETECTED NOT DETECTED Final   Cyclospora cayetanensis NOT DETECTED NOT DETECTED Final   Entamoeba histolytica NOT DETECTED NOT DETECTED Final   Giardia lamblia NOT DETECTED NOT DETECTED Final   Adenovirus F40/41 NOT DETECTED NOT DETECTED Final   Astrovirus NOT DETECTED NOT DETECTED Final   Norovirus GI/GII NOT DETECTED NOT DETECTED Final   Rotavirus A NOT DETECTED NOT DETECTED Final   Sapovirus (I, II, IV, and V) NOT DETECTED NOT DETECTED Final    Radiology Studies: No results found. Scheduled Meds: . enoxaparin (LOVENOX) injection  40 mg Subcutaneous Q24H  . feeding supplement  1 Container Oral  BID BM  . hydrocortisone  25 mg Rectal BID  . multivitamin with minerals  1 tablet Oral Daily  . nystatin  5 mL Oral QID   Continuous Infusions: . ceFEPime (MAXIPIME) IV Stopped (05/17/17 6387)  . vancomycin Stopped (05/17/17 0816)     LOS: 3 days   Time spent: 25 minutes   Vance Gather, MD [facility] Pager [phone]   If 7PM-7AM, please contact night-coverage [email or web address] Password [email or web address] 05/17/2017, 11:40 AM

## 2017-05-17 NOTE — Consult Note (Signed)
[place] CONSULT NOTE  Patient Care Team: Leighton Ruff, MD as PCP - General (Family Medicine) Nicholas Lose, MD as Consulting Physician (Hematology and Oncology) Stark Klein, MD as Consulting Physician (General Surgery) Eppie Gibson, MD as Attending Physician (Radiation Oncology)  CHIEF COMPLAINTS/PURPOSE OF CONSULTATION:  Neutropenic fever, locally advanced breast cancer  HISTORY OF PRESENTING ILLNESS:  Ana Wu 36 y.o. female was diagnosed with breast cancer around July 2018.  According to her electronic records, she had locally advanced right breast cancer, ER PR positive, HER-2/neu negative, stage T2 N1 M0, currently undergoing neoadjuvant chemotherapy at [facility].  The patient also tested positive for BRCA mutation. He was seen by Dr. Lindi Adie not long ago in August for discussion about treatment but patient subsequently made informed decision to transfer care to [facility]. She has been receiving dose dense Adriamycin and Cytoxan through [facility], the last dose was on 05/06/2017, under the care of Dr. Harden Mo. On May 06, 2017, her CBC show white count of 5.5, hemoglobin 10.8 and platelet count of 205. The patient subsequently presented to the emergency department on May 14, 2017 with fever and chills.  The patient have developed some oral thrush and mild watery diarrhea.  She was tachycardic and was found to have febrile neutropenia. CBC on admission showed white count of 0.3, hemoglobin of 9.8 and platelet count of 105. Over the last few days, she had persistent pancytopenia with worsening hemoglobin level. So far, blood culture and urine culture from May 14, 2017 were negative Chest x-ray show no evidence of pulmonary infiltrate. The patient has not had flu shot this year. She has been afebrile since the last 24 hours. She denies mucositis. She has some mild headache and sinus congestion.  No cough.  MEDICAL HISTORY:   Past Medical History:  Diagnosis Date  . Cancer [facility])    Breast Right     SURGICAL HISTORY: Past Surgical History:  Procedure Laterality Date  . PORTACATH PLACEMENT Left 03/27/2017   Procedure: INSERTION PORT-A-CATH;  Surgeon: Stark Klein, MD;  Location: [facility];  Service: General;  Laterality: Left;  . WISDOM TOOTH EXTRACTION      SOCIAL HISTORY: Social History   Social History  . Marital status: Married    Spouse name: N/A  . Number of children: N/A  . Years of education: N/A   Occupational History  . Not on file.   Social History Main Topics  . Smoking status: Never Smoker  . Smokeless tobacco: Never Used  . Alcohol use Yes     Comment: occas  . Drug use: No  . Sexual activity: Yes    Partners: Male   Other Topics Concern  . Not on file   Social History Narrative  . No narrative on file    FAMILY HISTORY: Family History  Problem Relation Age of Onset  . Pancreatic cancer Maternal Grandmother   . Lung cancer Paternal Grandmother   . Prostate cancer Paternal Grandfather   . Breast cancer Paternal Aunt   . Breast cancer Paternal Aunt     ALLERGIES:  is allergic to no known allergies.  MEDICATIONS:  Current Facility-Administered Medications  Medication Dose Route Frequency Provider Last Rate Last Dose  . acetaminophen (TYLENOL) tablet 650 mg  650 mg Oral Q6H PRN Rise Patience, MD   650 mg at 05/17/17 1421   Or  . acetaminophen (TYLENOL) suppository 650 mg  650 mg Rectal Q6H PRN Rise Patience, MD      .  ceFEPIme (MAXIPIME) 2 g in dextrose 5 % 50 mL IVPB  2 g Intravenous Q8H Dorrene German, RPH   Stopped at 05/17/17 1430  . docusate sodium (COLACE) capsule 100 mg  100 mg Oral BID PRN Rise Patience, MD      . enoxaparin (LOVENOX) injection 40 mg  40 mg Subcutaneous Q24H Rise Patience, MD   40 mg at 05/17/17 9563  . feeding supplement (BOOST / RESOURCE BREEZE) liquid 1 Container  1 Container Oral BID BM Theodis Blaze, MD   1  Container at 05/15/17 1733  . hydrocortisone (ANUSOL-HC) 2.5 % rectal cream   Rectal TID PRN Theodis Blaze, MD      . hydrocortisone (ANUSOL-HC) suppository 25 mg  25 mg Rectal BID Theodis Blaze, MD   25 mg at 05/17/17 8756  . loratadine (CLARITIN) tablet 10 mg  10 mg Oral Daily PRN Rise Patience, MD      . LORazepam (ATIVAN) tablet 0.5 mg  0.5 mg Oral QHS PRN Rise Patience, MD      . multivitamin with minerals tablet 1 tablet  1 tablet Oral Daily Rise Patience, MD   1 tablet at 05/17/17 [phone]  . nystatin (MYCOSTATIN) 100000 UNIT/ML suspension 500,000 Units  5 mL Oral QID Rise Patience, MD   500,000 Units at 05/17/17 1400  . ondansetron (ZOFRAN) tablet 4 mg  4 mg Oral Q6H PRN Rise Patience, MD       Or  . ondansetron [facility]) injection 4 mg  4 mg Intravenous Q6H PRN Rise Patience, MD      . polyethylene glycol (MIRALAX / GLYCOLAX) packet 17 g  17 g Oral Daily PRN Rise Patience, MD      . prochlorperazine (COMPAZINE) tablet 10 mg  10 mg Oral Q6H PRN Rise Patience, MD      . sodium chloride flush (NS) 0.9 % injection 10-40 mL  10-40 mL Intracatheter PRN Theodis Blaze, MD   20 mL at 05/16/17 2348  . Tbo-Filgrastim (GRANIX) injection 480 mcg  480 mcg Subcutaneous q1800 Heath Lark, MD   480 mcg at 05/17/17 1442  . vancomycin (VANCOCIN) 1,250 mg in sodium chloride 0.9 % 250 mL IVPB  1,250 mg Intravenous Q8H Dara Hoyer, RPH   Stopped at 05/17/17 1530    REVIEW OF SYSTEMS:   Eyes: Denies blurriness of vision, double vision or watery eyes Ears, nose, mouth, throat, and face: Denies mucositis or sore throat Respiratory: Denies cough, dyspnea or wheezes Cardiovascular: Denies palpitation, chest discomfort or lower extremity swelling Skin: Denies abnormal skin rashes Lymphatics: Denies new lymphadenopathy or easy bruising Neurological:Denies numbness, tingling or new weaknesses Behavioral/Psych: Mood is stable, no new changes  All other  systems were reviewed with the patient and are negative.  PHYSICAL EXAMINATION: ECOG PERFORMANCE STATUS: 1 - Symptomatic but completely ambulatory  Vitals:   05/17/17 0442 05/17/17 1343  BP: (!) 115/54 120/75  Pulse: 78 83  Resp: 18 18  Temp: 99.6 F (37.6 C) 98.3 F (36.8 C)  SpO2: 97% 93%   Filed Weights   05/14/17 0009 05/14/17 0546  Weight: 163 lb (73.9 kg) 163 lb 12.8 oz (74.3 kg)    GENERAL:alert, no distress and comfortable SKIN: skin color, texture, turgor are normal, no rashes or significant lesions EYES: normal, conjunctiva are pale and non-injected, sclera clear OROPHARYNX:no exudate, no erythema and lips, buccal mucosa, and tongue normal  NECK: supple, thyroid normal size,  non-tender, without nodularity LYMPH:  no palpable lymphadenopathy in the cervical, axillary or inguinal LUNGS: clear to auscultation and percussion with normal breathing effort HEART: regular rate & rhythm and no murmurs and no lower extremity edema ABDOMEN:abdomen soft, non-tender and normal bowel sounds Musculoskeletal:no cyanosis of digits and no clubbing  PSYCH: alert & oriented x 3 with fluent speech NEURO: no focal motor/sensory deficits  LABORATORY DATA:  I have reviewed the data as listed Lab Results  Component Value Date   WBC 1.1 (LL) 05/17/2017   HGB 7.6 (L) 05/17/2017   HCT 21.5 (L) 05/17/2017   MCV 90.7 05/17/2017   PLT 105 (L) 05/17/2017    Recent Labs  03/19/17 0846  05/14/17 0040 05/14/17 0500 05/16/17 0622 05/17/17 0343  NA 140  --  133* 136 141  --   K 3.7  --  3.6 3.5 3.4*  --   CL  --   --  101 102 107  --   CO2 26  --  23 25 27   --   GLUCOSE 104  --  119* 107* 104*  --   BUN 11.7  --  11 9 5*  --   CREATININE 0.8  < > 0.59 0.60 0.47 0.50  CALCIUM 9.5  --  8.7* 9.0 8.8*  --   GFRNONAA  --   < > >60 >60 >60 >60  GFRAA  --   < > >60 >60 >60 >60  PROT 7.4  --  7.0  --   --   --   ALBUMIN 4.0  --  3.8  --   --   --   AST 16  --  12*  --   --   --   ALT 11   --  14  --   --   --   ALKPHOS 39*  --  41  --   --   --   BILITOT 0.67  --  0.6  --   --   --   < > = values in this interval not displayed.  RADIOGRAPHIC STUDIES: I have personally reviewed the radiological images as listed and agreed with the findings in the report. Dg Chest Port 1 View  Result Date: 05/14/2017 CLINICAL DATA:  Fever since 05/11/2017. Currently undergoing chemotherapy for breast cancer. EXAM: PORTABLE CHEST 1 VIEW COMPARISON:  05/12/2017 FINDINGS: Power port type central venous catheter with tip over the low SVC region. No pneumothorax. Normal heart size and pulmonary vascularity. Lungs are clear. No airspace disease or consolidation. No blunting of costophrenic angles. IMPRESSION: No active disease. Electronically Signed   By: Lucienne Capers M.D.   On: 05/14/2017 03:54    ASSESSMENT & PLAN:   Locally advanced breast cancer Continue supportive care She will resume treatment at [facility] next week  Febrile neutropenia Recommend G-CSF support She will continue antibiotics I recommend stopping vancomycin today as cultures are negative but continue Cefepime Avoid suppository.  Anusol cream is discontinued  Severe anemia We discussed signs and symptoms of anemia For now, she is not symptomatic She probably can tolerate anemia and not needing blood transfusion unless hemoglobin is less than 7  Mild thrombocytopenia Could be due to infection or side effects of antibiotic treatment There is no contraindication to remain on antiplatelet agents or anticoagulants as long as the platelet is greater than 50,000.  DVT prophylaxis She is on Lovenox  Discharge planning If febrile neutropenia resolved and white count is improved, she can be discharged home  Hopefully the next 48 hours  All questions were answered. The patient knows to call the clinic with any problems, questions or concerns.    Heath Lark, MD 05/17/2017 2:58 PM

## 2017-05-18 DIAGNOSIS — K644 Residual hemorrhoidal skin tags: Secondary | ICD-10-CM

## 2017-05-18 DIAGNOSIS — K648 Other hemorrhoids: Secondary | ICD-10-CM

## 2017-05-18 LAB — CBC WITH DIFFERENTIAL/PLATELET
BASOS ABS: 0.1 10*3/uL (ref 0.0–0.1)
BASOS PCT: 2 %
EOS ABS: 0 10*3/uL (ref 0.0–0.7)
Eosinophils Relative: 0 %
HCT: 22.5 % — ABNORMAL LOW (ref 36.0–46.0)
Hemoglobin: 7.8 g/dL — ABNORMAL LOW (ref 12.0–15.0)
Lymphocytes Relative: 18 %
Lymphs Abs: 0.5 10*3/uL — ABNORMAL LOW (ref 0.7–4.0)
MCH: 31.8 pg (ref 26.0–34.0)
MCHC: 34.7 g/dL (ref 30.0–36.0)
MCV: 91.8 fL (ref 78.0–100.0)
MONO ABS: 0.6 10*3/uL (ref 0.1–1.0)
MONOS PCT: 21 %
NEUTROS PCT: 60 %
Neutro Abs: 1.7 10*3/uL (ref 1.7–7.7)
Platelets: 134 10*3/uL — ABNORMAL LOW (ref 150–400)
RBC: 2.45 MIL/uL — ABNORMAL LOW (ref 3.87–5.11)
RDW: 13.8 % (ref 11.5–15.5)
WBC: 2.9 10*3/uL — ABNORMAL LOW (ref 4.0–10.5)

## 2017-05-18 MED ORDER — WITCH HAZEL-GLYCERIN EX PADS
MEDICATED_PAD | CUTANEOUS | Status: DC | PRN
  Filled 2017-05-18: qty 100

## 2017-05-18 NOTE — Progress Notes (Signed)
PROGRESS NOTE  Ana Wu  GUR:[id number] DOB: 08-25-1980 DOA: 05/14/2017  PCP: Leighton Ruff, MD   Brief Narrative:  36 y.o. female with breast cancer on chemotherapy, last chemotherapy was last week and [facility] presents to the ER with complaints of persistent fever and chills. Patient has been having these symptoms 2-3 days. Patient had gone to her PCP 2 days ago and was prescribed antibiotics. Patient also noticed some oral thrush and also has started developing watery diarrhea. Denies any abdominal pain nausea vomiting. Despite taking the oral antibiotics patient was still febrile and presented to the ER.   Assessment & Plan: Febrile neutropenia: Due to neoadjuvant chemotherapy but no clear infectious source on exam or work up to date. No GI pathogens isolated. ?viral URI with very mild symptoms.  - Continue broad antibacterial coverage with vancomycin, cefepime. If still febrile at 5 days, will add antifungal coverage.  - Dr. Alvy Bimler has evaluated patient, recommending additional day of IV abx, transition to augmentin for total of 10 days of therapy if still afebrile 10/8. Felt malaise symptoms this AM with low grade temperature, though not truly febrile for 48 hours.  - GCSF given 10/4 and 10/6. Neutropenia resolved.  - Monitor CBC and culture data.   Pancytopenia: Acutely dropped hgb with stable plt's without evidence of bleeding. Suspect inadequate marrow response.  - Monitor closely, feel that DVT ppx is still indicated.   Diarrhea: possibly related to chemo and laxatives taken for constipation/hemorrhoids. No GI pathogens or C. diff on evaluation. Resolved.   Malignant neoplasm of lower-outer quadrant of right breast of female, estrogen receptor positive: Actively undergoing neoadjuvant chemotherapy with plans for surgery Jan 2019 followed by radiation.  - Attempted to contact pt's oncologist at [facility], Dr. Deitra Mayo, no return call yet. Will  need to delay next treatment.   Nodule: Around rectal area without cellulitic changes and no abscess to suggest abscess. Developed 10/8 in setting of broad spectrum abx.  - Monitor for now. Topical tucks for hemorrhoids.   DVT prophylaxis: Lovenox SQ Code Status: Full  Family Communication: Husband at bedside Disposition Plan: Home 10/8 if afebrile.  Consultants:   Oncology, Dr. Alvy Bimler  Procedures:   None  Antimicrobials:   Vancomycin 10/03 -->  Cefepime 10/03 -->  Subjective: Documented 99.56F oral temperature this morning with her feeling some malaise and runny nose. No myalgias, eye redness, pain, sore throat, cough, or dyspnea. Just feels worse this morning than yesterday. Also has painful bump around the anus that started yesterday.    Objective: Vitals:   05/17/17 1343 05/17/17 2024 05/18/17 0615 05/18/17 0705  BP: 120/75 121/67 (!) 115/57   Pulse: 83 77 98   Resp: 18 18 19    Temp: 98.3 F (36.8 C) 98.3 F (36.8 C) 98.7 F (37.1 C) 99.7 F (37.6 C)  TempSrc: Oral Oral Oral   SpO2: 93% 98% 96%   Weight:      Height:       Gen: Well-appearing 36 y.o.female in NAD HEENT: MMM, posterior oropharynx clear Pulm: Non-labored; CTAB, no wheezes  CV: Regular rate, no murmur appreciated; distal pulses intact/symmetric GI: + BS; soft, non-tender, non-distended Rectal: Nonthrombosed external hemorrhoids without fissure or bleeding. To the patient's left of the anus is a 37mm deep nodular density with mild tenderness to palpation. No fluctuance, overlying erythema. Examined with patient's husband in room.  Skin: No rashes, wounds, ulcers. Left upper chest port c/d/i. No bleeding or bruising. Neuro: A&Ox3,  CN II-XII without deficits   CBC:  Recent Labs Lab 05/14/17 0040 05/14/17 0500 05/15/17 1833 05/17/17 0343 05/18/17 0405  WBC 0.3* 0.3* 0.5* 1.1* 2.9*  NEUTROABS 0.0*  --  0.1* 0.4* 1.7  HGB 9.8* 11.1* 7.8* 7.6* 7.8*  HCT 27.8* 31.9* 21.8* 21.5* 22.5*  MCV  89.1 89.1 89.3 90.7 91.8  PLT 105* 110* 92* 105* 831*   Basic Metabolic Panel:  Recent Labs Lab 05/14/17 0040 05/14/17 0500 05/16/17 0622 05/17/17 0343  NA 133* 136 141  --   K 3.6 3.5 3.4*  --   CL 101 102 107  --   CO2 23 25 27   --   GLUCOSE 119* 107* 104*  --   BUN 11 9 5*  --   CREATININE 0.59 0.60 0.47 0.50  CALCIUM 8.7* 9.0 8.8*  --    Liver Function Tests:  Recent Labs Lab 05/14/17 0040  AST 12*  ALT 14  ALKPHOS 41  BILITOT 0.6  PROT 7.0  ALBUMIN 3.8   Urine analysis:    Component Value Date/Time   COLORURINE YELLOW 05/14/2017 0012   APPEARANCEUR CLEAR 05/14/2017 0012   LABSPEC 1.011 05/14/2017 0012   PHURINE 7.0 05/14/2017 0012   GLUCOSEU NEGATIVE 05/14/2017 0012   HGBUR NEGATIVE 05/14/2017 0012   BILIRUBINUR NEGATIVE 05/14/2017 0012   KETONESUR 20 (A) 05/14/2017 0012   PROTEINUR NEGATIVE 05/14/2017 0012   NITRITE NEGATIVE 05/14/2017 0012   LEUKOCYTESUR NEGATIVE 05/14/2017 0012   Recent Results (from the past 240 hour(s))  Culture, blood (routine x 2)     Status: None (Preliminary result)   Collection Time: 05/14/17  3:06 AM  Result Value Ref Range Status   Specimen Description BLOOD PORTA CATH  Final   Special Requests   Final    BOTTLES DRAWN AEROBIC AND ANAEROBIC Blood Culture adequate volume   Culture   Final    NO GROWTH 3 DAYS Performed at [facility], [address], [place], [place] [address]    Report Status PENDING  Incomplete  Urine culture     Status: None   Collection Time: 05/14/17  3:06 AM  Result Value Ref Range Status   Specimen Description URINE, CLEAN CATCH  Final   Special Requests NONE  Final   Culture   Final    NO GROWTH Performed at [facility], [place] [address] [place], [place] [address]    Report Status 05/15/2017 FINAL  Final  Culture, blood (routine x 2)     Status: None (Preliminary result)   Collection Time: 05/14/17  3:11 AM  Result Value Ref Range Status   Specimen Description BLOOD RIGHT  ANTECUBITAL  Final   Special Requests   Final    BOTTLES DRAWN AEROBIC AND ANAEROBIC Blood Culture adequate volume   Culture   Final    NO GROWTH 3 DAYS Performed at [facility], [place] [address], [place], [place] [address]    Report Status PENDING  Incomplete  Culture, blood (x 2)     Status: None (Preliminary result)   Collection Time: 05/14/17  6:51 AM  Result Value Ref Range Status   Specimen Description BLOOD RIGHT ARM  Final   Special Requests   Final    BOTTLES DRAWN AEROBIC AND ANAEROBIC Blood Culture adequate volume   Culture   Final    NO GROWTH 3 DAYS Performed at [facility], [address] [place], [place] [address]    Report Status PENDING  Incomplete  Culture, blood (x 2)     Status: None (Preliminary result)   Collection Time: 05/14/17  6:51 AM  Result Value Ref Range Status   Specimen Description BLOOD LEFT ARM  Final   Special Requests IN PEDIATRIC BOTTLE Blood Culture adequate volume  Final   Culture   Final    NO GROWTH 3 DAYS Performed at [facility], [place] [address], [place], [place] [address]    Report Status PENDING  Incomplete  C difficile quick scan w PCR reflex     Status: None   Collection Time: 05/14/17 10:00 AM  Result Value Ref Range Status   C Diff antigen NEGATIVE NEGATIVE Final   C Diff toxin NEGATIVE NEGATIVE Final   C Diff interpretation No C. difficile detected.  Final  Gastrointestinal Panel by PCR , Stool     Status: None   Collection Time: 05/14/17 10:00 AM  Result Value Ref Range Status   Campylobacter species NOT DETECTED NOT DETECTED Final   Plesimonas shigelloides NOT DETECTED NOT DETECTED Final   Salmonella species NOT DETECTED NOT DETECTED Final   Yersinia enterocolitica NOT DETECTED NOT DETECTED Final   Vibrio species NOT DETECTED NOT DETECTED Final   Vibrio cholerae NOT DETECTED NOT DETECTED Final   Enteroaggregative E coli (EAEC) NOT DETECTED NOT DETECTED Final   Enteropathogenic E coli (EPEC) NOT  DETECTED NOT DETECTED Final   Enterotoxigenic E coli (ETEC) NOT DETECTED NOT DETECTED Final   Shiga like toxin producing E coli (STEC) NOT DETECTED NOT DETECTED Final   Shigella/Enteroinvasive E coli (EIEC) NOT DETECTED NOT DETECTED Final   Cryptosporidium NOT DETECTED NOT DETECTED Final   Cyclospora cayetanensis NOT DETECTED NOT DETECTED Final   Entamoeba histolytica NOT DETECTED NOT DETECTED Final   Giardia lamblia NOT DETECTED NOT DETECTED Final   Adenovirus F40/41 NOT DETECTED NOT DETECTED Final   Astrovirus NOT DETECTED NOT DETECTED Final   Norovirus GI/GII NOT DETECTED NOT DETECTED Final   Rotavirus A NOT DETECTED NOT DETECTED Final   Sapovirus (I, II, IV, and V) NOT DETECTED NOT DETECTED Final    Radiology Studies: No results found. Scheduled Meds: . enoxaparin (LOVENOX) injection  40 mg Subcutaneous Q24H  . feeding supplement  1 Container Oral BID BM  . multivitamin with minerals  1 tablet Oral Daily  . nystatin  5 mL Oral QID   Continuous Infusions: . ceFEPime (MAXIPIME) IV Stopped (05/18/17 8250)     LOS: 4 days   Time spent: 25 minutes   Vance Gather, MD [facility] Pager [phone]   If 7PM-7AM, please contact night-coverage [email or web address] Password [email or web address] 05/18/2017, 12:15 PM

## 2017-05-18 NOTE — Progress Notes (Signed)
Ana Wu   DOB:08/17/1980   DE#:[id number]    Subjective: According to documentation by nursing staff, there is no febrile episode.  However, she reported feeling unwell around 7 AM with a documented low-grade temperature at 99.7.  She denies chills.  She complain of mild hemorrhoidal irritation near the perianal site.  No mucositis  Objective:  Vitals:   05/18/17 0615 05/18/17 0705  BP: (!) 115/57   Pulse: 98   Resp: 19   Temp: 98.7 F (37.1 C) 99.7 F (37.6 C)  SpO2: 96%      Intake/Output Summary (Last 24 hours) at 05/18/17 1136 Last data filed at 05/18/17 0500  Gross per 24 hour  Intake              830 ml  Output                0 ml  Net              830 ml    GENERAL:alert, no distress and comfortable SKIN: Noted nonthrombosed hemorrhoidal skin tags near the anus without any signs of bleeding EYES: normal, Conjunctiva are pink and non-injected, sclera clear OROPHARYNX:no exudate, no erythema and lips, buccal mucosa, and tongue normal  NECK: supple, thyroid normal size, non-tender, without nodularity LYMPH:  no palpable lymphadenopathy in the cervical, axillary or inguinal LUNGS: clear to auscultation and percussion with normal breathing effort HEART: regular rate & rhythm and no murmurs and no lower extremity edema ABDOMEN:abdomen soft, non-tender and normal bowel sounds Musculoskeletal:no cyanosis of digits and no clubbing  NEURO: alert & oriented x 3 with fluent speech, no focal motor/sensory deficits   Labs:  Lab Results  Component Value Date   WBC 2.9 (L) 05/18/2017   HGB 7.8 (L) 05/18/2017   HCT 22.5 (L) 05/18/2017   MCV 91.8 05/18/2017   PLT 134 (L) 05/18/2017   NEUTROABS 1.7 05/18/2017    Lab Results  Component Value Date   NA 141 05/16/2017   K 3.4 (L) 05/16/2017   CL 107 05/16/2017   CO2 27 05/16/2017   Assessment & Plan:   Locally advanced breast cancer Continue supportive care She will resume treatment at [facility] next week I recommend  delaying treatment this week due to recent episode of neutropenic fever  Febrile neutropenia She responded to 1 dose of G-CSF on May 17, 2017 So far, all cultures are negative. I have discontinued vancomycin on May 17, 2017.  I recommend continue cefepime coverage for at least 1 more day.  When she is ready to be discharged, I recommend transitioning her to oral antibiotics with Augmentin for total 10 days of coverage including the day she received intravenous antibiotic  Mild perianal irritation with hemorrhoids Avoid suppository.  Anusol cream is discontinued I recommend conservative management with TUCKS  Severe anemia We discussed signs and symptoms of anemia For now, she is not symptomatic She probably can tolerate anemia and not needing blood transfusion unless hemoglobin is less than 7  Mild thrombocytopenia Could be due to infection or side effects of antibiotic treatment There is no contraindication to remain on antiplatelet agents or anticoagulants as long as the platelet is greater than 50,000.  DVT prophylaxis She is on Lovenox  Discharge planning Hopefully, she can be discharged within the next 24-48 hours I do not feel strongly that she is ready to be discharged today.  Heath Lark, MD 05/18/2017  11:36 AM

## 2017-05-18 NOTE — Progress Notes (Signed)
RN called to room by patient who was in tears, c/o a sore to her anus area. Upon exam, pt does have a small, yellow/white open sore to left side of anus. Area around sore is also reddened and hard to the touch. On-call MD paged to be made aware. Will also notify on-coming RN.

## 2017-05-19 ENCOUNTER — Other Ambulatory Visit: Payer: BC Managed Care – PPO

## 2017-05-19 ENCOUNTER — Encounter: Payer: BC Managed Care – PPO | Admitting: Genetic Counselor

## 2017-05-19 DIAGNOSIS — R509 Fever, unspecified: Secondary | ICD-10-CM

## 2017-05-19 DIAGNOSIS — K611 Rectal abscess: Secondary | ICD-10-CM

## 2017-05-19 LAB — CULTURE, BLOOD (ROUTINE X 2)
CULTURE: NO GROWTH
Culture: NO GROWTH
Culture: NO GROWTH
Culture: NO GROWTH
SPECIAL REQUESTS: ADEQUATE
SPECIAL REQUESTS: ADEQUATE
Special Requests: ADEQUATE
Special Requests: ADEQUATE

## 2017-05-19 LAB — CBC WITH DIFFERENTIAL/PLATELET
BASOS ABS: 0.1 10*3/uL (ref 0.0–0.1)
Basophils Relative: 2 %
Eosinophils Absolute: 0 10*3/uL (ref 0.0–0.7)
Eosinophils Relative: 0 %
HCT: 22.8 % — ABNORMAL LOW (ref 36.0–46.0)
HEMOGLOBIN: 7.7 g/dL — AB (ref 12.0–15.0)
LYMPHS PCT: 21 %
Lymphs Abs: 0.6 10*3/uL — ABNORMAL LOW (ref 0.7–4.0)
MCH: 31.3 pg (ref 26.0–34.0)
MCHC: 33.8 g/dL (ref 30.0–36.0)
MCV: 92.7 fL (ref 78.0–100.0)
MONOS PCT: 22 %
Monocytes Absolute: 0.6 10*3/uL (ref 0.1–1.0)
Neutro Abs: 1.5 10*3/uL — ABNORMAL LOW (ref 1.7–7.7)
Neutrophils Relative %: 55 %
PLATELETS: 151 10*3/uL (ref 150–400)
RBC: 2.46 MIL/uL — AB (ref 3.87–5.11)
RDW: 14.2 % (ref 11.5–15.5)
WBC: 2.8 10*3/uL — ABNORMAL LOW (ref 4.0–10.5)

## 2017-05-19 MED ORDER — LIDOCAINE HCL (PF) 2 % IJ SOLN
0.0000 mL | Freq: Once | INTRAMUSCULAR | Status: DC | PRN
  Filled 2017-05-19: qty 20

## 2017-05-19 NOTE — Progress Notes (Signed)
PROGRESS NOTE  Ana Wu  TWS:[id number] DOB: 1981/02/02 DOA: 05/14/2017  PCP: Leighton Ruff, MD   Brief Narrative:  36 y.o. female with breast cancer on chemotherapy, last chemotherapy was last week and [facility] presents to the ER with complaints of persistent fever and chills. Patient has been having these symptoms 2-3 days. Patient had gone to her PCP 2 days ago and was prescribed antibiotics. Patient also noticed some oral thrush and also has started developing watery diarrhea. Denies any abdominal pain nausea vomiting. Despite taking the oral antibiotics patient was still febrile and presented to the ER. She was admitted without a certain source and has defervesced on cefepime. GCSF was given with improvement in [place]. On 10/7 she developed a small perirectal nodule which has become fluctuant so surgery is consulted for consideration of I&D.  Assessment & Plan: Febrile neutropenia: Due to neoadjuvant chemotherapy. No clear infectious source on exam or work up to date. No GI pathogens isolated.  - Continue broad antibacterial coverage with vancomycin, cefepime. If still febrile at 5 days, will add antifungal coverage.  - Dr. Alvy Bimler has evaluated patient, recommending additional day of IV abx, transition to augmentin for total of 10 days of therapy if still afebrile 10/8. Felt malaise symptoms this AM with low grade temperature, though not truly febrile for 48 hours.  - GCSF given 10/4 and 10/6. Neutropenia resolved.  - Monitor CBC and culture data.   Developing perirectal abscess: Began 10/7, now fluctuant. Doubt this was cause of presentation.  - Surgery consulted. Doubt we can get around this without I&D. Fortunately neutropenia is resolving.   Pancytopenia: Acutely dropped hgb with stable plt's without evidence of bleeding. Suspect inadequate marrow response.  - Monitor closely, feel that DVT ppx is still indicated.   Diarrhea: possibly related to chemo  and laxatives taken for constipation/hemorrhoids. No GI pathogens or C. diff on evaluation. Resolved.   Malignant neoplasm of lower-outer quadrant of right breast of female, estrogen receptor positive: Actively undergoing neoadjuvant chemotherapy with plans for surgery Jan 2019 followed by radiation.  [name] with oncologist at [facility], Dr. Deitra Mayo. Will need to delay next treatment.   Nodule: Around rectal area without cellulitic changes and no abscess to suggest abscess. Developed 10/8 in setting of broad spectrum abx.  - Monitor for now. Topical tucks for hemorrhoids.   DVT prophylaxis: Lovenox SQ Code Status: Full  Family Communication: Husband at bedside Disposition Plan: Pending surgery evaluation.  Consultants:   Oncology, Dr. Alvy Bimler  Surgery, Earnstine Regal, [place]  Procedures:   None  Antimicrobials:   Vancomycin 10/03 - 10/05  Cefepime 10/03 -->  Subjective: Feels better generally today but perirectal bump has gotten larger and more painful. Also have left side predominant headache.  Objective: Vitals:   05/18/17 2056 05/19/17 0140 05/19/17 0608 05/19/17 1311  BP: (!) 117/52  108/60 124/64  Pulse: 82  76 72  Resp: 17  18 18   Temp: 99.3 F (37.4 C) 99.1 F (37.3 C) 98.3 F (36.8 C) 99 F (37.2 C)  TempSrc: Oral Oral Oral Oral  SpO2: 99%  100% 98%  Weight:      Height:       Gen: Well-appearing 36 y.o.female in NAD HEENT: MMM, posterior oropharynx clear Pulm: Non-labored; CTAB, no wheezes  CV: Regular rate, no murmur appreciated; distal pulses intact/symmetric GI: + BS; soft, non-tender, non-distended Rectal: Skin tags of external hemorrhoids without thrombosis, fissure or bleeding. Larger nodular area with fluctuance  and worsened tenderness to palpation. Pt's mother in room throughout encounter.  Skin: No rashes, wounds, ulcers. Left upper chest port c/d/i. No bleeding or bruising. Neuro: A&Ox3, CN II-XII without deficits   CBC:  Recent  Labs Lab 05/14/17 0040 05/14/17 0500 05/15/17 1833 05/17/17 0343 05/18/17 0405 05/19/17 0615  WBC 0.3* 0.3* 0.5* 1.1* 2.9* 2.8*  NEUTROABS 0.0*  --  0.1* 0.4* 1.7 1.5*  HGB 9.8* 11.1* 7.8* 7.6* 7.8* 7.7*  HCT 27.8* 31.9* 21.8* 21.5* 22.5* 22.8*  MCV 89.1 89.1 89.3 90.7 91.8 92.7  PLT 105* 110* 92* 105* 134* 270   Basic Metabolic Panel:  Recent Labs Lab 05/14/17 0040 05/14/17 0500 05/16/17 0622 05/17/17 0343  NA 133* 136 141  --   K 3.6 3.5 3.4*  --   CL 101 102 107  --   CO2 23 25 27   --   GLUCOSE 119* 107* 104*  --   BUN 11 9 5*  --   CREATININE 0.59 0.60 0.47 0.50  CALCIUM 8.7* 9.0 8.8*  --    Liver Function Tests:  Recent Labs Lab 05/14/17 0040  AST 12*  ALT 14  ALKPHOS 41  BILITOT 0.6  PROT 7.0  ALBUMIN 3.8   Urine analysis:    Component Value Date/Time   COLORURINE YELLOW 05/14/2017 0012   APPEARANCEUR CLEAR 05/14/2017 0012   LABSPEC 1.011 05/14/2017 0012   PHURINE 7.0 05/14/2017 0012   GLUCOSEU NEGATIVE 05/14/2017 0012   HGBUR NEGATIVE 05/14/2017 0012   BILIRUBINUR NEGATIVE 05/14/2017 0012   KETONESUR 20 (A) 05/14/2017 0012   PROTEINUR NEGATIVE 05/14/2017 0012   NITRITE NEGATIVE 05/14/2017 0012   LEUKOCYTESUR NEGATIVE 05/14/2017 0012   Recent Results (from the past 240 hour(s))  Culture, blood (routine x 2)     Status: None   Collection Time: 05/14/17  3:06 AM  Result Value Ref Range Status   Specimen Description BLOOD PORTA CATH  Final   Special Requests   Final    BOTTLES DRAWN AEROBIC AND ANAEROBIC Blood Culture adequate volume   Culture   Final    NO GROWTH 5 DAYS Performed at [facility], [address], [place], [place] [address]    Report Status 05/19/2017 FINAL  Final  Urine culture     Status: None   Collection Time: 05/14/17  3:06 AM  Result Value Ref Range Status   Specimen Description URINE, CLEAN CATCH  Final   Special Requests NONE  Final   Culture   Final    NO GROWTH Performed at [facility], [place] [address] [place], [place] [address]    Report Status 05/15/2017 FINAL  Final  Culture, blood (routine x 2)     Status: None   Collection Time: 05/14/17  3:11 AM  Result Value Ref Range Status   Specimen Description BLOOD RIGHT ANTECUBITAL  Final   Special Requests   Final    BOTTLES DRAWN AEROBIC AND ANAEROBIC Blood Culture adequate volume   Culture   Final    NO GROWTH 5 DAYS Performed at [facility], [place] [address], [place],  [address]    Report Status 05/19/2017 FINAL  Final  Culture, blood (x 2)     Status: None   Collection Time: 05/14/17  6:51 AM  Result Value Ref Range Status   Specimen Description BLOOD RIGHT ARM  Final   Special Requests   Final    BOTTLES DRAWN AEROBIC AND ANAEROBIC Blood Culture adequate volume  Culture   Final    NO GROWTH 5 DAYS Performed at [facility], [place] [address], [place], [place] [address]    Report Status 05/19/2017 FINAL  Final  Culture, blood (x 2)     Status: None   Collection Time: 05/14/17  6:51 AM  Result Value Ref Range Status   Specimen Description BLOOD LEFT ARM  Final   Special Requests IN PEDIATRIC BOTTLE Blood Culture adequate volume  Final   Culture   Final    NO GROWTH 5 DAYS Performed at [facility], [place] [address], [place], [place] [address]    Report Status 05/19/2017 FINAL  Final  C difficile quick scan w PCR reflex     Status: None   Collection Time: 05/14/17 10:00 AM  Result Value Ref Range Status   C Diff antigen NEGATIVE NEGATIVE Final   C Diff toxin NEGATIVE NEGATIVE Final   C Diff interpretation No C. difficile detected.  Final  Gastrointestinal Panel by PCR , Stool     Status: None   Collection Time: 05/14/17 10:00 AM  Result Value Ref Range Status   Campylobacter species NOT DETECTED NOT DETECTED Final   Plesimonas shigelloides NOT DETECTED NOT DETECTED Final   Salmonella species NOT DETECTED NOT DETECTED Final   Yersinia enterocolitica NOT DETECTED NOT DETECTED Final   Vibrio  species NOT DETECTED NOT DETECTED Final   Vibrio cholerae NOT DETECTED NOT DETECTED Final   Enteroaggregative E coli (EAEC) NOT DETECTED NOT DETECTED Final   Enteropathogenic E coli (EPEC) NOT DETECTED NOT DETECTED Final   Enterotoxigenic E coli (ETEC) NOT DETECTED NOT DETECTED Final   Shiga like toxin producing E coli (STEC) NOT DETECTED NOT DETECTED Final   Shigella/Enteroinvasive E coli (EIEC) NOT DETECTED NOT DETECTED Final   Cryptosporidium NOT DETECTED NOT DETECTED Final   Cyclospora cayetanensis NOT DETECTED NOT DETECTED Final   Entamoeba histolytica NOT DETECTED NOT DETECTED Final   Giardia lamblia NOT DETECTED NOT DETECTED Final   Adenovirus F40/41 NOT DETECTED NOT DETECTED Final   Astrovirus NOT DETECTED NOT DETECTED Final   Norovirus GI/GII NOT DETECTED NOT DETECTED Final   Rotavirus A NOT DETECTED NOT DETECTED Final   Sapovirus (I, II, IV, and V) NOT DETECTED NOT DETECTED Final    Radiology Studies: No results found. Scheduled Meds: . enoxaparin (LOVENOX) injection  40 mg Subcutaneous Q24H  . feeding supplement  1 Container Oral BID BM  . multivitamin with minerals  1 tablet Oral Daily  . nystatin  5 mL Oral QID   Continuous Infusions: . ceFEPime (MAXIPIME) IV 2 g (05/19/17 1432)     LOS: 5 days   Time spent: 25 minutes   Vance Gather, MD [facility] Pager [phone]   If 7PM-7AM, please contact night-coverage [email or web address] Password [email or web address] 05/19/2017, 3:18 PM

## 2017-05-19 NOTE — Consult Note (Signed)
Reason for Consult:  Perirectal abscess with neutropenia on Chemotherapy Referring Physician: Ria Wu is an 36 y.o. female. 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  Patient  had a port placed on 03/24/17 by Dr. Barry Wu. She has been undergoing chemotherapy at [facility]. She was admitted on 05/14/17 with severe febrile neutropenia,  Pancytopenia, diarrhea, and oral thrush.  She has been on antibiotics, mycostatin, Granix, and Neupogen.  She now has an area on her buttocks and they are concerned she has a perirectal abscess.  She is almost ready to go home from their standpoint. She is afebrile now, VSS, WBC has gone from 0.300 to 2800 this AM.   Past Medical History:  Diagnosis Date  . Cancer [facility])    Breast Right     Past Surgical History:  Procedure Laterality Date  . PORTACATH PLACEMENT Left 03/27/2017   Procedure: INSERTION PORT-A-CATH;  Surgeon: Ana Klein, MD;  Location: [facility];  Service: General;  Laterality: Left;  . WISDOM TOOTH EXTRACTION      Family History  Problem Relation Age of Onset  . Pancreatic cancer Maternal Grandmother   . Lung cancer Paternal Grandmother   . Prostate cancer Paternal Grandfather   . Breast cancer Paternal Aunt   . Breast cancer Paternal Aunt     Social History:  reports that she has never smoked. She has never used smokeless tobacco. She reports that she drinks alcohol. She reports that she does not use drugs.  Allergies:  Allergies  Allergen Reactions  . No Known Allergies     Medications:  Prior to Admission:  Prescriptions Prior to Admission  Medication Sig Dispense Refill Last Dose  . acetaminophen (TYLENOL) 500 MG tablet Take 500 mg by mouth every 6 (six) hours as needed for mild pain.   Past Week at Unknown time  . dexamethasone (DECADRON) 4 MG tablet Take 1 tablet (4 mg total) by mouth  daily. 1 tab daily for 2 days starting day after chemo with food (Patient taking differently: Take 4 mg by mouth daily. As directed for 3 days starting day after chemo with food) 12 tablet 0 05/09/2017 at Unknown time  . docusate sodium (COLACE) 100 MG capsule Take 100 mg by mouth 2 (two) times daily as needed for mild constipation.   05/11/2017 at Unknown time  . hydrocortisone (ANUSOL-HC) 2.5 % rectal cream Place 1 application rectally 3 (three) times daily.   05/13/2017 at Unknown time  . ibuprofen (ADVIL,MOTRIN) 200 MG tablet Take 400 mg by mouth every 8 (eight) hours as needed (for pain.).   Past Month at Unknown time  . lidocaine-prilocaine (EMLA) cream Apply to affected area once 30 g 3 05/13/2017 at Unknown time  . loratadine (CLARITIN) 10 MG tablet Take 10 mg by mouth daily as needed (for bone pain). For 2 days after chemo   Past Week at Unknown time  . LORazepam (ATIVAN) 0.5 MG tablet Take 1 tablet (0.5 mg total) by mouth at bedtime. (Patient taking differently: Take 0.5 mg by mouth at bedtime as needed for sleep. ) 30 tablet 0 05/13/2017 at Unknown time  . Multiple Vitamin (MULTIVITAMIN WITH MINERALS) TABS tablet Take 1 tablet by mouth daily. doTerra Microplex VM2 (multivitamin)   05/13/2017 at Unknown time  . Omega-3 Fatty Acids (OMEGA-3 PO) Take 2 capsules by mouth daily.   05/13/2017  at Unknown time  . ondansetron (ZOFRAN) 8 MG tablet Take 1 tablet (8 mg total) by mouth 2 (two) times daily as needed. Start on the third day after chemotherapy. (Patient taking differently: Take 8 mg by mouth 2 (two) times daily as needed for nausea. Start on the third day after chemotherapy.) 30 tablet 1 Past Week at Unknown time  . polyethylene glycol (MIRALAX / GLYCOLAX) packet Take 17 g by mouth daily as needed for moderate constipation.   05/11/2017 at Unknown time  . prochlorperazine (COMPAZINE) 10 MG tablet Take 1 tablet (10 mg total) by mouth every 6 (six) hours as needed (Nausea or vomiting). 30 tablet 1 not  used  . senna (SENOKOT) 8.6 MG TABS tablet Take 1 tablet by mouth once.   05/02/2017 at Unknown time   Continuous: . ceFEPime (MAXIPIME) IV 2 g (05/19/17 1308)   Anti-infectives    Start     Dose/Rate Route Frequency Ordered Stop   05/16/17 1400  vancomycin (VANCOCIN) 1,250 mg in sodium chloride 0.9 % 250 mL IVPB  Status:  Discontinued     1,250 mg 166.7 mL/hr over 90 Minutes Intravenous Every 8 hours 05/16/17 0901 05/17/17 1506   05/14/17 1400  vancomycin (VANCOCIN) IVPB 750 mg/150 ml premix  Status:  Discontinued     750 mg 150 mL/hr over 60 Minutes Intravenous Every 8 hours 05/14/17 0346 05/16/17 0901   05/14/17 0400  ceFEPIme (MAXIPIME) 2 g in dextrose 5 % 50 mL IVPB     2 g 100 mL/hr over 30 Minutes Intravenous Every 8 hours 05/14/17 0340     05/14/17 0315  piperacillin-tazobactam (ZOSYN) IVPB 3.375 g  Status:  Discontinued     3.375 g 100 mL/hr over 30 Minutes Intravenous  Once 05/14/17 0305 05/14/17 0340   05/14/17 0315  vancomycin (VANCOCIN) IVPB 1000 mg/200 mL premix     1,000 mg 200 mL/hr over 60 Minutes Intravenous  Once 05/14/17 0305 05/14/17 0515      Results for orders placed or performed during the hospital encounter of 05/14/17 (from the past 48 hour(s))  CBC with Differential/Platelet     Status: Abnormal   Collection Time: 05/18/17  4:05 AM  Result Value Ref Range   WBC 2.9 (L) 4.0 - 10.5 K/uL   RBC 2.45 (L) 3.87 - 5.11 MIL/uL   Hemoglobin 7.8 (L) 12.0 - 15.0 g/dL   HCT 22.5 (L) 36.0 - 46.0 %   MCV 91.8 78.0 - 100.0 fL   MCH 31.8 26.0 - 34.0 pg   MCHC 34.7 30.0 - 36.0 g/dL   RDW 13.8 11.5 - 15.5 %   Platelets 134 (L) 150 - 400 K/uL   Neutrophils Relative % 60 %   Neutro Abs 1.7 1.7 - 7.7 K/uL   Lymphocytes Relative 18 %   Lymphs Abs 0.5 (L) 0.7 - 4.0 K/uL   Monocytes Relative 21 %   Monocytes Absolute 0.6 0.1 - 1.0 K/uL   Eosinophils Relative 0 %   Eosinophils Absolute 0.0 0.0 - 0.7 K/uL   Basophils Relative 2 %   Basophils Absolute 0.1 0.0 - 0.1 K/uL     WBC Morphology      MODERATE LEFT SHIFT (>5% METAS AND MYELOS,OCC PRO NOTED)    Wu: DOHLE BODIES   RBC Morphology POLYCHROMASIA PRESENT   CBC with Differential/Platelet     Status: Abnormal   Collection Time: 05/19/17  6:15 AM  Result Value Ref Range   WBC 2.8 (L) 4.0 - 10.5 K/uL  RBC 2.46 (L) 3.87 - 5.11 MIL/uL   Hemoglobin 7.7 (L) 12.0 - 15.0 g/dL   HCT 22.8 (L) 36.0 - 46.0 %   MCV 92.7 78.0 - 100.0 fL   MCH 31.3 26.0 - 34.0 pg   MCHC 33.8 30.0 - 36.0 g/dL   RDW 14.2 11.5 - 15.5 %   Platelets 151 150 - 400 K/uL   Neutrophils Relative % 55 %   Lymphocytes Relative 21 %   Monocytes Relative 22 %   Eosinophils Relative 0 %   Basophils Relative 2 %   Neutro Abs 1.5 (L) 1.7 - 7.7 K/uL   Lymphs Abs 0.6 (L) 0.7 - 4.0 K/uL   Monocytes Absolute 0.6 0.1 - 1.0 K/uL   Eosinophils Absolute 0.0 0.0 - 0.7 K/uL   Basophils Absolute 0.1 0.0 - 0.1 K/uL   RBC Morphology POLYCHROMASIA PRESENT    WBC Morphology      MODERATE LEFT SHIFT (>5% METAS AND MYELOS,OCC PRO NOTED)    Wu: TOXIC GRANULATION DOHLE BODIES     No results found.  Review of Systems  All other systems reviewed and are negative.  Blood pressure 108/60, pulse 76, temperature 98.3 F (36.8 C), temperature source Oral, resp. rate 18, height 5' 3"  (1.6 m), weight 74.3 kg (163 lb 12.8 oz), last menstrual period 04/17/2017, SpO2 100 %, unknown if currently breastfeeding. Physical Exam  Constitutional: She is oriented to person, place, and time. She appears well-developed and well-nourished. No distress.  HENT:  Head: Normocephalic and atraumatic.  Mouth/Throat: No oropharyngeal exudate.  Eyes: Right eye exhibits no discharge. Left eye exhibits no discharge. No scleral icterus.  Pupils are equal   Neck: Normal range of motion. Neck supple. No JVD present. No tracheal deviation present. No thyromegaly present.  Cardiovascular: Normal rate, regular rhythm, normal heart sounds and intact distal pulses.   No murmur  heard. Respiratory: Effort normal and breath sounds normal. No respiratory distress. She has no wheezes. She has no rales. She exhibits no tenderness.  GI: Soft. Bowel sounds are normal. She exhibits no distension and no mass. There is no tenderness. There is no rebound and no guarding.  Genitourinary:     Musculoskeletal: She exhibits no edema or tenderness.  Lymphadenopathy:    She has no cervical adenopathy.  Neurological: She is alert and oriented to person, place, and time. No cranial nerve deficit. Coordination normal.  Skin: Skin is warm and dry. No rash noted. She is not diaphoretic. No erythema. No pallor.  Psychiatric: She has a normal mood and affect. Her behavior is normal. Judgment and thought content normal.    Assessment/Plan: Developing perirectal abscess Severe neutropenia on chemotherapy Right breast cancer FEN:  Regular diet last PO around 9 AM ID:  Off antibiotics DVT:  Lovenox    Plan:  Sitz baths/ order an I&D tray.  Will discuss with Dr. Redmond Pulling. I will make her NPO for now, till I can talk with Dr. Redmond Pulling.    Ana Wu 05/19/2017, 10:18 AM

## 2017-05-19 NOTE — Progress Notes (Signed)
   05/19/17 1500  Clinical Encounter Type  Visited With Patient and family together  Visit Type Initial  Referral From Nurse  Consult/Referral To Chaplain  Spiritual Encounters  Spiritual Needs Prayer;Emotional   Responding to a consult for [place].  Patient was with I believe her mother.  We had a nice visit and she indicated she might go home today.  She has an amazing spirit about her and a very positive outlook.  We prayed together and will follow as needed. Chaplain Katherene Ponto

## 2017-05-19 NOTE — Progress Notes (Addendum)
Pharmacy Antibiotic Note  Ana Wu is a 36 y.o. female with hx of Breast Ca currently undergoing chemo admitted on 05/14/2017 with febrile neutropenia.  Pharmacy has been consulted for cefepime and vancomycin dosing.  Today, 05/19/2017:  Day 6 Cefepime  Per Dr. Alvy Bimler, plan was to convert to Augmentin today if remains stable  Afebrile > 48 hr  WBC improved, however ANC still hovering ~1.5 despite Granix  SCr stable wnl  Plan:  Continue Cefepime 2g IV q8h  Nystatin ordered per MD for oral thrush  Height: 5\' 3"  (160 cm) Weight: 163 lb 12.8 oz (74.3 kg) IBW/kg (Calculated) : 52.4  Temp (24hrs), Avg:98.9 F (37.2 C), Min:98.3 F (36.8 C), Max:99.3 F (37.4 C)   Recent Labs Lab 05/14/17 0040 05/14/17 0046 05/14/17 0500 05/15/17 1833 05/16/17 0622 05/17/17 0343 05/18/17 0405 05/19/17 0615  WBC 0.3*  --  0.3* 0.5*  --  1.1* 2.9* 2.8*  CREATININE 0.59  --  0.60  --  0.47 0.50  --   --   LATICACIDVEN  --  0.55  --   --   --   --   --   --   VANCOTROUGH  --   --   --   --  9*  --   --   --     Estimated Creatinine Clearance: 93 mL/min (by C-G formula based on SCr of 0.5 mg/dL).    Allergies  Allergen Reactions  . No Known Allergies     Antimicrobials this admission: 10/3 cefepime >>  10/3 vancomycin >> 10/6   Dose adjustments this admission: 10/5 0622 VT = 9 mcg/mL on 750mg  q8h (prior to 6th dose)  Microbiology results: 10/3 C. Diff: neg/neg 10/3 GIP: neg 10/3 BCx (0300): NGF 10/3 BCx (0700): NGF 10/3 Ucx: NGF  Thank you for allowing pharmacy to be a part of this patient's care.  Reuel Boom, PharmD, BCPS Pager: [phone] 05/19/2017, 3:17 PM

## 2017-05-20 DIAGNOSIS — K610 Anal abscess: Secondary | ICD-10-CM

## 2017-05-20 LAB — CBC WITH DIFFERENTIAL/PLATELET
Basophils Absolute: 0 10*3/uL (ref 0.0–0.1)
Basophils Relative: 1 %
EOS ABS: 0 10*3/uL (ref 0.0–0.7)
Eosinophils Relative: 0 %
HCT: 22.8 % — ABNORMAL LOW (ref 36.0–46.0)
Hemoglobin: 7.8 g/dL — ABNORMAL LOW (ref 12.0–15.0)
LYMPHS PCT: 22 %
Lymphs Abs: 0.5 10*3/uL — ABNORMAL LOW (ref 0.7–4.0)
MCH: 32.1 pg (ref 26.0–34.0)
MCHC: 34.2 g/dL (ref 30.0–36.0)
MCV: 93.8 fL (ref 78.0–100.0)
MONO ABS: 0.6 10*3/uL (ref 0.1–1.0)
MONOS PCT: 27 %
NEUTROS ABS: 1.1 10*3/uL — AB (ref 1.7–7.7)
NRBC: 3 /100{WBCs} — AB
Neutrophils Relative %: 50 %
PLATELETS: 207 10*3/uL (ref 150–400)
RBC: 2.43 MIL/uL — AB (ref 3.87–5.11)
RDW: 14.4 % (ref 11.5–15.5)
WBC: 2.2 10*3/uL — AB (ref 4.0–10.5)

## 2017-05-20 MED ORDER — HEPARIN SOD (PORK) LOCK FLUSH 100 UNIT/ML IV SOLN
500.0000 [IU] | INTRAVENOUS | Status: AC | PRN
  Administered 2017-05-20: 500 [IU]

## 2017-05-20 MED ORDER — AMOXICILLIN-POT CLAVULANATE 875-125 MG PO TABS: 1.0000 | ORAL_TABLET | Freq: Two times a day (BID) | ORAL | 0 refills | Status: AC

## 2017-05-20 NOTE — Progress Notes (Signed)
[facility] Progress Note      Assessment/Plan Severe neutropenia on chemotherapy Right breast cancer  Perianal abscess - bedside I&D  Follow up: 2 weeks in our clinic or at her PCP  DISPO: Pt tolerated bedside I&D well. Iodioform placed. Will need to do sitz baths BID and/or after every bowel movement. Packing can come out in 2 days or if falls out sooner no need to repack.     LOS: 6 days    Subjective:  CC; Perianal abscess  Pt states maybe some drainage last evening. No increased pain.   Objective: Vital signs in last 24 hours: Temp:  [98.4 F (36.9 C)-99.4 F (37.4 C)] 98.4 F (36.9 C) (10/09 0921) Pulse Rate:  [70-84] 73 (10/09 0921) Resp:  [16-20] 16 (10/09 0921) BP: (116-124)/(57-71) 123/71 (10/09 0921) SpO2:  [96 %-100 %] 100 % (10/09 0921) Last BM Date: 05/19/17  Intake/Output from previous day: 10/08 0701 - 10/09 0700 In: 360 [P.O.:360] Out: -  Intake/Output this shift: No intake/output data recorded.  PE: Gen:  Alert, NAD, pleasant, cooperative Card:  RRR, no M/G/R heard Pulm:  Rate and effort normal GU: small left sided perianal abscess without surrounding erythema, mild TTP, no drainage noted   Anti-infectives: Anti-infectives    Start     Dose/Rate Route Frequency Ordered Stop   05/16/17 1400  vancomycin (VANCOCIN) 1,250 mg in sodium chloride 0.9 % 250 mL IVPB  Status:  Discontinued     1,250 mg 166.7 mL/hr over 90 Minutes Intravenous Every 8 hours 05/16/17 0901 05/17/17 1506   05/14/17 1400  vancomycin (VANCOCIN) IVPB 750 mg/150 ml premix  Status:  Discontinued     750 mg 150 mL/hr over 60 Minutes Intravenous Every 8 hours 05/14/17 0346 05/16/17 0901   05/14/17 0400  ceFEPIme (MAXIPIME) 2 g in dextrose 5 % 50 mL IVPB     2 g 100 mL/hr over 30 Minutes Intravenous Every 8 hours 05/14/17 0340     05/14/17 0315  piperacillin-tazobactam (ZOSYN) IVPB 3.375 g  Status:  Discontinued     3.375 g 100 mL/hr over 30 Minutes  Intravenous  Once 05/14/17 0305 05/14/17 0340   05/14/17 0315  vancomycin (VANCOCIN) IVPB 1000 mg/200 mL premix     1,000 mg 200 mL/hr over 60 Minutes Intravenous  Once 05/14/17 0305 05/14/17 0515      Lab Results:   Recent Labs  05/19/17 0615 05/20/17 0448  WBC 2.8* 2.2*  HGB 7.7* 7.8*  HCT 22.8* 22.8*  PLT 151 207   BMET No results for input(s): NA, K, CL, CO2, GLUCOSE, BUN, CREATININE, CALCIUM in the last 72 hours. PT/INR No results for input(s): LABPROT, INR in the last 72 hours. CMP     Component Value Date/Time   NA 141 05/16/2017 0622   NA 140 03/19/2017 0846   K 3.4 (L) 05/16/2017 0622   K 3.7 03/19/2017 0846   CL 107 05/16/2017 0622   CO2 27 05/16/2017 0622   CO2 26 03/19/2017 0846   GLUCOSE 104 (H) 05/16/2017 0622   GLUCOSE 104 03/19/2017 0846   BUN 5 (L) 05/16/2017 0622   BUN 11.7 03/19/2017 0846   CREATININE 0.50 05/17/2017 0343   CREATININE 0.8 03/19/2017 0846   CALCIUM 8.8 (L) 05/16/2017 0622   CALCIUM 9.5 03/19/2017 0846   PROT 7.0 05/14/2017 0040   PROT 7.4 03/19/2017 0846   ALBUMIN 3.8 05/14/2017 0040   ALBUMIN 4.0 03/19/2017 0846   AST 12 (L) 05/14/2017 0040  AST 16 03/19/2017 0846   ALT 14 05/14/2017 0040   ALT 11 03/19/2017 0846   ALKPHOS 41 05/14/2017 0040   ALKPHOS 39 (L) 03/19/2017 0846   BILITOT 0.6 05/14/2017 0040   BILITOT 0.67 03/19/2017 0846   GFRNONAA >60 05/17/2017 0343   GFRAA >60 05/17/2017 0343   Lipase  No results found for: LIPASE  Studies/Results: No results found.    Kalman Drape , [facility] 05/20/2017, 10:34 AM Pager: [phone] Consults: [phone] Mon-Fri 7:00 am-4:30 pm Sat-Sun 7:00 am-11:30 am

## 2017-05-20 NOTE — Discharge Summary (Signed)
Physician Discharge Summary  Ana Wu GEX:[id number] DOB: [birth year] DOA: 05/14/2017  PCP: Leighton Ruff, MD  Admit date: 05/14/2017 Discharge date: 05/20/2017  Admitted From: Home Disposition: Home   Recommendations for Outpatient Follow-up:  1. Follow up with oncology in next 1 - 2 weeks 2. Complete 5 more days of augmentin  3. Sitz baths BID, pull packing if not spontaneously out in 48 hours  Home Health: None Equipment/Devices: None Discharge Condition: Stable CODE STATUS: Full Diet recommendation: Regular  Brief/Interim Summary: 36 y.o.femalewith breast cancer on chemotherapy, last chemotherapy was last week and [name] presents to the ER with complaints of persistent fever and chills. Patient has been having these symptoms 2-3 days. Patient had gone to her PCP 2 days ago and was prescribed antibiotics. Patient also noticed some oral thrush and also has started developing watery diarrhea. Denies any abdominal pain nausea vomiting. Despite taking the oral antibiotics patient was still febrile and presented to the ER.She was admitted without a certain source and has defervesced on cefepime. GCSF was given with improvement in [place]. On 10/7 she developed a small perirectal nodule which has become fluctuant so surgery was consulted and performed bedside I&D 10/9. She remains stable and ready for discharge.   Discharge Diagnoses:  Principal Problem:   Febrile neutropenia ([place]) Active Problems:   Malignant neoplasm of lower-outer quadrant of right breast of female, estrogen receptor positive (HCC)  Febrile neutropenia: Due to neoadjuvant chemotherapy. No clear infectious source on exam or work up to date. No GI pathogens isolated.  - Converted vanc/cefepime to cefepime alone with defervescence. Will transition to po augmentin for 5 more days per oncology recommendations. Cultures remain negative. - WBC improved to 2.2 on day of discharge. GCSF given 10/4  and 10/6. Neutropenia resolved.   Perianal abscess: Began 10/7. Bedside I&D by surgery 10/9.  - Wound care instructions provided.    Pancytopenia: Acutely dropped hgb with stable plt's without evidence of bleeding. Suspect inadequate marrow response.  - Monitor at follow up. No symptoms with hgb steady at 7.8 mg/dl. No transfusions provided.  Diarrhea: possibly related to chemo and laxatives taken for constipation/hemorrhoids. No GI pathogens or C. diff on evaluation. Resolved.   Malignant neoplasm of lower-outer quadrant of right breast of female, estrogen receptor positive: Actively undergoing neoadjuvant chemotherapy with plans for surgery Jan 2019 followed by radiation.  [name] with oncologist at [facility], Dr. Deitra Mayo. Will need to delay next treatment. Has been in contact with NP, plan to follow up next Thursday for ongoing therapy.  Discharge Instructions Discharge Instructions    Discharge instructions    Complete by:  As directed    Follow up with oncology as scheduled Complete 5 more days of augmentin (sent to your pharmacy)  Sitz baths twice daily and after BM, pull packing if not spontaneously out in 48 hours If you develop a fever, or if the rash worsens/becomes painful or blistering, seek medical attention right away.   Increase activity slowly    Complete by:  As directed      Allergies as of 05/20/2017      Reactions   No Known Allergies       Medication List    STOP taking these medications   senna 8.6 MG Tabs tablet Commonly known as:  SENOKOT     TAKE these medications   acetaminophen 500 MG tablet Commonly known as:  TYLENOL Take 500 mg by mouth every 6 (six) hours as needed for  mild pain.   amoxicillin-clavulanate 875-125 MG tablet Commonly known as:  AUGMENTIN Take 1 tablet by mouth 2 (two) times daily.   dexamethasone 4 MG tablet Commonly known as:  DECADRON Take 1 tablet (4 mg total) by mouth daily. 1 tab daily for 2 days starting day  after chemo with food What changed:  additional instructions   docusate sodium 100 MG capsule Commonly known as:  COLACE Take 100 mg by mouth 2 (two) times daily as needed for mild constipation.   hydrocortisone 2.5 % rectal cream Commonly known as:  ANUSOL-HC Place 1 application rectally 3 (three) times daily.   ibuprofen 200 MG tablet Commonly known as:  ADVIL,MOTRIN Take 400 mg by mouth every 8 (eight) hours as needed (for pain.).   lidocaine-prilocaine cream Commonly known as:  EMLA Apply to affected area once   loratadine 10 MG tablet Commonly known as:  CLARITIN Take 10 mg by mouth daily as needed (for bone pain). For 2 days after chemo   LORazepam 0.5 MG tablet Commonly known as:  ATIVAN Take 1 tablet (0.5 mg total) by mouth at bedtime. What changed:  when to take this  reasons to take this   multivitamin with minerals Tabs tablet Take 1 tablet by mouth daily. doTerra Microplex VM2 (multivitamin)   OMEGA-3 PO Take 2 capsules by mouth daily.   ondansetron 8 MG tablet Commonly known as:  ZOFRAN Take 1 tablet (8 mg total) by mouth 2 (two) times daily as needed. Start on the third day after chemotherapy. What changed:  reasons to take this  additional instructions   polyethylene glycol packet Commonly known as:  MIRALAX / GLYCOLAX Take 17 g by mouth daily as needed for moderate constipation.   prochlorperazine 10 MG tablet Commonly known as:  COMPAZINE Take 1 tablet (10 mg total) by mouth every 6 (six) hours as needed (Nausea or vomiting).      Follow-up Information    Leighton Ruff, MD Follow up.   Specialty:  Family Medicine Contact information: [place] [address] [phone]        Darral Dash, MD Follow up.   Specialty:  Internal Medicine Contact information: [facility] [address]  [place] [address] [phone]          Allergies  Allergen Reactions  . No Known Allergies      Consultations:  Oncology, Dr. Alvy Bimler  Surgery, Dr. Redmond Pulling  Procedures/Studies: Dg Chest Port 1 View  Result Date: 05/14/2017 CLINICAL DATA:  Fever since 05/11/2017. Currently undergoing chemotherapy for breast cancer. EXAM: PORTABLE CHEST 1 VIEW COMPARISON:  05/12/2017 FINDINGS: Power port type central venous catheter with tip over the low SVC region. No pneumothorax. Normal heart size and pulmonary vascularity. Lungs are clear. No airspace disease or consolidation. No blunting of costophrenic angles. IMPRESSION: No active disease. Electronically Signed   By: Lucienne Capers M.D.   On: 05/14/2017 03:54   Subjective: No fevers, tolerated I&D well. Feels very well.  Discharge Exam: Vitals:   05/20/17 0921 05/20/17 1258  BP: 123/71 (!) 124/56  Pulse: 73 69  Resp: 16 16  Temp: 98.4 F (36.9 C) 98.4 F (36.9 C)  SpO2: 100% 97%   General: Pt is alert, awake, not in acute distress Cardiovascular: RRR, S1/S2 +, no rubs, no gallops Respiratory: CTA bilaterally, no wheezing, no rhonchi Abdominal: Soft, NT, ND, bowel sounds + Extremities: No edema, no cyanosis Skin: Small left-sided perianal abscess without erythema.   Labs: Basic  Metabolic Panel:  Recent Labs Lab 05/14/17 0040 05/14/17 0500 05/16/17 0622 05/17/17 0343  NA 133* 136 141  --   K 3.6 3.5 3.4*  --   CL 101 102 107  --   CO2 23 25 27   --   GLUCOSE 119* 107* 104*  --   BUN 11 9 5*  --   CREATININE 0.59 0.60 0.47 0.50  CALCIUM 8.7* 9.0 8.8*  --    Liver Function Tests:  Recent Labs Lab 05/14/17 0040  AST 12*  ALT 14  ALKPHOS 41  BILITOT 0.6  PROT 7.0  ALBUMIN 3.8   CBC:  Recent Labs Lab 05/15/17 1833 05/17/17 0343 05/18/17 0405 05/19/17 0615 05/20/17 0448  WBC 0.5* 1.1* 2.9* 2.8* 2.2*  NEUTROABS 0.1* 0.4* 1.7 1.5* 1.1*  HGB 7.8* 7.6* 7.8* 7.7* 7.8*  HCT 21.8* 21.5* 22.5* 22.8* 22.8*  MCV 89.3 90.7 91.8 92.7 93.8  PLT 92* 105* 134* 151 207   Urinalysis    Component Value Date/Time    COLORURINE YELLOW 05/14/2017 0012   APPEARANCEUR CLEAR 05/14/2017 0012   LABSPEC 1.011 05/14/2017 0012   PHURINE 7.0 05/14/2017 0012   GLUCOSEU NEGATIVE 05/14/2017 0012   HGBUR NEGATIVE 05/14/2017 0012   BILIRUBINUR NEGATIVE 05/14/2017 0012   KETONESUR 20 (A) 05/14/2017 0012   PROTEINUR NEGATIVE 05/14/2017 0012   NITRITE NEGATIVE 05/14/2017 0012   LEUKOCYTESUR NEGATIVE 05/14/2017 0012    Microbiology Recent Results (from the past 240 hour(s))  Culture, blood (routine x 2)     Status: None   Collection Time: 05/14/17  3:06 AM  Result Value Ref Range Status   Specimen Description BLOOD PORTA CATH  Final   Special Requests   Final    BOTTLES DRAWN AEROBIC AND ANAEROBIC Blood Culture adequate volume   Culture   Final    NO GROWTH 5 DAYS Performed at [facility], [address] [place], [place] [address]    Report Status 05/19/2017 FINAL  Final  Urine culture     Status: None   Collection Time: 05/14/17  3:06 AM  Result Value Ref Range Status   Specimen Description URINE, CLEAN CATCH  Final   Special Requests NONE  Final   Culture   Final    NO GROWTH Performed at [facility], [place] [address] [place], [place] [address]    Report Status 05/15/2017 FINAL  Final  Culture, blood (routine x 2)     Status: None   Collection Time: 05/14/17  3:11 AM  Result Value Ref Range Status   Specimen Description BLOOD RIGHT ANTECUBITAL  Final   Special Requests   Final    BOTTLES DRAWN AEROBIC AND ANAEROBIC Blood Culture adequate volume   Culture   Final    NO GROWTH 5 DAYS Performed at [facility], [place] [address], [place], [place] [address]    Report Status 05/19/2017 FINAL  Final  Culture, blood (x 2)     Status: None   Collection Time: 05/14/17  6:51 AM  Result Value Ref Range Status   Specimen Description BLOOD RIGHT ARM  Final   Special Requests   Final    BOTTLES DRAWN AEROBIC AND ANAEROBIC Blood Culture adequate volume   Culture   Final    NO GROWTH 5  DAYS Performed at [facility], [place] [address], [place], [place] [address]    Report Status 05/19/2017 FINAL  Final  Culture, blood (x 2)  Status: None   Collection Time: 05/14/17  6:51 AM  Result Value Ref Range Status   Specimen Description BLOOD LEFT ARM  Final   Special Requests IN PEDIATRIC BOTTLE Blood Culture adequate volume  Final   Culture   Final    NO GROWTH 5 DAYS Performed at [facility], [place] [address] [place], [place] [address]    Report Status 05/19/2017 FINAL  Final  C difficile quick scan w PCR reflex     Status: None   Collection Time: 05/14/17 10:00 AM  Result Value Ref Range Status   C Diff antigen NEGATIVE NEGATIVE Final   C Diff toxin NEGATIVE NEGATIVE Final   C Diff interpretation No C. difficile detected.  Final  Gastrointestinal Panel by PCR , Stool     Status: None   Collection Time: 05/14/17 10:00 AM  Result Value Ref Range Status   Campylobacter species NOT DETECTED NOT DETECTED Final   Plesimonas shigelloides NOT DETECTED NOT DETECTED Final   Salmonella species NOT DETECTED NOT DETECTED Final   Yersinia enterocolitica NOT DETECTED NOT DETECTED Final   Vibrio species NOT DETECTED NOT DETECTED Final   Vibrio cholerae NOT DETECTED NOT DETECTED Final   Enteroaggregative E coli (EAEC) NOT DETECTED NOT DETECTED Final   Enteropathogenic E coli (EPEC) NOT DETECTED NOT DETECTED Final   Enterotoxigenic E coli (ETEC) NOT DETECTED NOT DETECTED Final   Shiga like toxin producing E coli (STEC) NOT DETECTED NOT DETECTED Final   Shigella/Enteroinvasive E coli (EIEC) NOT DETECTED NOT DETECTED Final   Cryptosporidium NOT DETECTED NOT DETECTED Final   Cyclospora cayetanensis NOT DETECTED NOT DETECTED Final   Entamoeba histolytica NOT DETECTED NOT DETECTED Final   Giardia lamblia NOT DETECTED NOT DETECTED Final   Adenovirus F40/41 NOT DETECTED NOT DETECTED Final   Astrovirus NOT DETECTED NOT DETECTED Final   Norovirus GI/GII NOT DETECTED NOT DETECTED  Final   Rotavirus A NOT DETECTED NOT DETECTED Final   Sapovirus (I, II, IV, and V) NOT DETECTED NOT DETECTED Final    Time coordinating discharge: Approximately 40 minutes  Vance Gather, MD  [facility] 05/20/2017, 1:50 PM Pager [phone]

## 2017-05-20 NOTE — Progress Notes (Signed)
Nutrition Follow-up  DOCUMENTATION CODES:   Not applicable  INTERVENTION:  - Will d/c Boost Breeze per pt preference. - Continue to encourage PO intakes. - RD will monitor for additional nutrition-related needs.   NUTRITION DIAGNOSIS:   Increased nutrient needs related to chronic illness (breast cancer) as evidenced by estimated needs -ongoing  GOAL:   Patient will meet greater than or equal to 90% of their needs -beginning to meet  MONITOR:   PO intake, Weight trends, Labs, Supplement acceptance  ASSESSMENT:   Pt is a 36 year old female admitted for febrile neutropenia. Pt currently has breast cancer and is undergoing chemotherapy at [facility]. Her last chemo treatment was last week.   10/9 Doc flow sheet shows 100% completion of breakfast yesterday and no other intakes documented since previous assessment. Pt reports appetite is much improved and that the past few days she has been eating 100% of the items she orders at meals. She denies any discomfort or side effects (such as N/V/D) with eating. She had I&D of perianal abscess this AM and reports some slight discomfort but otherwise feeling good following the procedure. Discharge order and summary placed ~15 minutes ago. Surgery PA in to talk with pt following RD visit. Pt denies any nutrition-related questions or concerns.  Medications reviewed; daily multivitamin with minerals. Labs reviewed; K: 3.4 mmol/L, BUN: 5 mg/dL, Ca: 8.8 mg/dL.   10/3 - Pt consumed eggs, french toast, and fruit for breakfast.  - Yesterday pt ate cereal for breakfast, stir fry for lunch, and half of a [place] sandwich with a pear for dinner.  - Pt has been tolerating food well since admission but reports decreased appetite.  - PO intake at home has fluctuated based on treatment time.  - Oral thrush is not causing pain or mouth sores.  - Pt reports a 5 lb weight loss since starting chemotherapy on 04/09/17 (3% weight loss).  - Pt  denies abdominal pain or n/v since admission.  - Per H&P note, pt developed watery diarrhea and has had 4 episodes in an undisclosed time frame.     Diet Order:  Diet regular Room service appropriate? Yes; Fluid consistency: Thin  Skin:  Reviewed, no issues  Last BM:  10/8  Height:   Ht Readings from Last 1 Encounters:  05/14/17 5\' 3"  (1.6 m)    Weight:   Wt Readings from Last 1 Encounters:  05/14/17 163 lb 12.8 oz (74.3 kg)    Ideal Body Weight:  52.3 kg  BMI:  Body mass index is 29.02 kg/m.  Estimated Nutritional Needs:   Kcal:  2250-2450 kcals  Protein:  100-115 grams protein  Fluid:  >/= 2 L  EDUCATION NEEDS:   No education needs identified at this time    Jarome Matin, MS, RD, LDN, CNSC Inpatient Clinical Dietitian Pager # [phone] After hours/weekend pager # [phone]

## 2017-05-20 NOTE — Procedures (Signed)
Incision and Drainage Procedure Note  Pre-operative Diagnosis: perianal abscess  Post-operative Diagnosis: same  Indications: perianal abscess  Anesthesia: 2% plain lidocaine  Procedure Details  The procedure, risks and complications have been discussed in detail (including, but not limited to infection and bleeding) with the patient, and the patient has signed consent to the procedure.  The skin was sterilely prepped and draped over the affected area in the usual fashion. After adequate local anesthesia, I&D with a #15 blade was performed on the left perianal region. Scant purulent drainage: present The patient was observed until stable.  Findings: Small left sided perianal abscess with scant purulent drainage  EBL: 0.5 cc's  Drains: none  Condition: Tolerated procedure well   Complications: none.

## 2017-07-06 ENCOUNTER — Emergency Department (HOSPITAL_COMMUNITY): Payer: BC Managed Care – PPO

## 2017-07-06 ENCOUNTER — Inpatient Hospital Stay (HOSPITAL_COMMUNITY)
Admission: EM | Admit: 2017-07-06 | Discharge: 2017-07-10 | DRG: 810 | Disposition: A | Discharge status: Home / Self Care | Payer: BC Managed Care – PPO | Attending: Family Medicine | Admitting: Family Medicine

## 2017-07-06 ENCOUNTER — Encounter (HOSPITAL_COMMUNITY): Payer: Self-pay | Admitting: Internal Medicine

## 2017-07-06 ENCOUNTER — Other Ambulatory Visit: Payer: Self-pay

## 2017-07-06 DIAGNOSIS — B349 Viral infection, unspecified: Secondary | ICD-10-CM | POA: Diagnosis present

## 2017-07-06 DIAGNOSIS — Z800 Family history of malignant neoplasm of digestive organs: Secondary | ICD-10-CM

## 2017-07-06 DIAGNOSIS — Z170 Estrogen receptor positive status [ER+]: Secondary | ICD-10-CM | POA: Diagnosis not present

## 2017-07-06 DIAGNOSIS — D649 Anemia, unspecified: Secondary | ICD-10-CM | POA: Diagnosis not present

## 2017-07-06 DIAGNOSIS — C50511 Malignant neoplasm of lower-outer quadrant of right female breast: Secondary | ICD-10-CM | POA: Diagnosis present

## 2017-07-06 DIAGNOSIS — Z9013 Acquired absence of bilateral breasts and nipples: Secondary | ICD-10-CM | POA: Diagnosis not present

## 2017-07-06 DIAGNOSIS — D709 Neutropenia, unspecified: Principal | ICD-10-CM

## 2017-07-06 DIAGNOSIS — Z803 Family history of malignant neoplasm of breast: Secondary | ICD-10-CM | POA: Diagnosis not present

## 2017-07-06 DIAGNOSIS — R5081 Fever presenting with conditions classified elsewhere: Secondary | ICD-10-CM | POA: Diagnosis present

## 2017-07-06 DIAGNOSIS — T451X5A Adverse effect of antineoplastic and immunosuppressive drugs, initial encounter: Secondary | ICD-10-CM | POA: Diagnosis present

## 2017-07-06 DIAGNOSIS — Z801 Family history of malignant neoplasm of trachea, bronchus and lung: Secondary | ICD-10-CM | POA: Diagnosis not present

## 2017-07-06 DIAGNOSIS — F329 Major depressive disorder, single episode, unspecified: Secondary | ICD-10-CM | POA: Diagnosis present

## 2017-07-06 LAB — URINALYSIS, ROUTINE W REFLEX MICROSCOPIC
BACTERIA UA: NONE SEEN
BILIRUBIN URINE: NEGATIVE
Glucose, UA: NEGATIVE mg/dL
Hgb urine dipstick: NEGATIVE
KETONES UR: NEGATIVE mg/dL
Leukocytes, UA: NEGATIVE
Nitrite: NEGATIVE
Protein, ur: 30 mg/dL — AB
Specific Gravity, Urine: 1.027 (ref 1.005–1.030)
pH: 6 (ref 5.0–8.0)

## 2017-07-06 LAB — COMPREHENSIVE METABOLIC PANEL
ALT: 33 U/L (ref 14–54)
ANION GAP: 7 (ref 5–15)
AST: 29 U/L (ref 15–41)
Albumin: 4 g/dL (ref 3.5–5.0)
Alkaline Phosphatase: 33 U/L — ABNORMAL LOW (ref 38–126)
BUN: 10 mg/dL (ref 6–20)
CHLORIDE: 102 mmol/L (ref 101–111)
CO2: 26 mmol/L (ref 22–32)
CREATININE: 0.71 mg/dL (ref 0.44–1.00)
Calcium: 9 mg/dL (ref 8.9–10.3)
GFR calc non Af Amer: >60 mL/min (ref >60)
Glucose, Bld: 98 mg/dL (ref 65–99)
Potassium: 3.8 mmol/L (ref 3.5–5.1)
SODIUM: 135 mmol/L (ref 135–145)
Total Bilirubin: 0.8 mg/dL (ref 0.3–1.2)
Total Protein: 6.9 g/dL (ref 6.5–8.1)

## 2017-07-06 LAB — CBC WITH DIFFERENTIAL/PLATELET
BASOS PCT: 1 %
Basophils Absolute: 0 10*3/uL (ref 0.0–0.1)
EOS PCT: 1 %
Eosinophils Absolute: 0 10*3/uL (ref 0.0–0.7)
HEMATOCRIT: 23.7 % — AB (ref 36.0–46.0)
HEMOGLOBIN: 8.2 g/dL — AB (ref 12.0–15.0)
LYMPHS PCT: 14 %
Lymphs Abs: 0.1 10*3/uL — ABNORMAL LOW (ref 0.7–4.0)
MCH: 31.7 pg (ref 26.0–34.0)
MCHC: 34.6 g/dL (ref 30.0–36.0)
MCV: 91.5 fL (ref 78.0–100.0)
Monocytes Absolute: 0.1 10*3/uL (ref 0.1–1.0)
Monocytes Relative: 6 %
NEUTROS PCT: 78 %
Neutro Abs: 0.7 10*3/uL — ABNORMAL LOW (ref 1.7–7.7)
Platelets: 156 10*3/uL (ref 150–400)
RBC: 2.59 MIL/uL — ABNORMAL LOW (ref 3.87–5.11)
RDW: 16.1 % — ABNORMAL HIGH (ref 11.5–15.5)
WBC: 0.9 10*3/uL — CL (ref 4.0–10.5)

## 2017-07-06 LAB — PREGNANCY, URINE: Preg Test, Ur: NEGATIVE

## 2017-07-06 LAB — INFLUENZA PANEL BY PCR (TYPE A & B)
INFLAPCR: NEGATIVE
INFLBPCR: NEGATIVE

## 2017-07-06 LAB — I-STAT CG4 LACTIC ACID, ED: LACTIC ACID, VENOUS: 1.16 mmol/L (ref 0.5–1.9)

## 2017-07-06 MED ORDER — IBUPROFEN 200 MG PO TABS
400.0000 mg | ORAL_TABLET | Freq: Three times a day (TID) | ORAL | Status: DC | PRN
  Administered 2017-07-06 – 2017-07-10 (×8): 400 mg via ORAL
  Filled 2017-07-06 (×9): qty 2

## 2017-07-06 MED ORDER — LACTATED RINGERS IV SOLN
INTRAVENOUS | Status: AC
  Administered 2017-07-06: 23:00:00 via INTRAVENOUS

## 2017-07-06 MED ORDER — ACETAMINOPHEN 650 MG RE SUPP: 650.0000 mg | Freq: Four times a day (QID) | RECTAL | Status: DC | PRN

## 2017-07-06 MED ORDER — ENOXAPARIN SODIUM 40 MG/0.4ML ~~LOC~~ SOLN
40.0000 mg | Freq: Every day | SUBCUTANEOUS | Status: DC
  Administered 2017-07-06 – 2017-07-07 (×2): 40 mg via SUBCUTANEOUS
  Filled 2017-07-06 (×2): qty 0.4

## 2017-07-06 MED ORDER — POLYETHYLENE GLYCOL 3350 17 G PO PACK: 17.0000 g | PACK | Freq: Every day | ORAL | Status: DC | PRN

## 2017-07-06 MED ORDER — DEXTROSE 5 % IV SOLN
2.0000 g | Freq: Once | INTRAVENOUS | Status: AC
  Administered 2017-07-06: 2 g via INTRAVENOUS
  Filled 2017-07-06: qty 2

## 2017-07-06 MED ORDER — LORAZEPAM 0.5 MG PO TABS
0.5000 mg | ORAL_TABLET | Freq: Every evening | ORAL | Status: DC | PRN
  Administered 2017-07-07 – 2017-07-09 (×4): 0.5 mg via ORAL
  Filled 2017-07-06 (×4): qty 1

## 2017-07-06 MED ORDER — DEXTROSE 5 % IV SOLN
2.0000 g | Freq: Three times a day (TID) | INTRAVENOUS | Status: DC
  Administered 2017-07-07 – 2017-07-10 (×11): 2 g via INTRAVENOUS
  Filled 2017-07-06 (×13): qty 2

## 2017-07-06 MED ORDER — DOCUSATE SODIUM 100 MG PO CAPS
100.0000 mg | ORAL_CAPSULE | Freq: Two times a day (BID) | ORAL | Status: DC
  Filled 2017-07-06 (×6): qty 1

## 2017-07-06 MED ORDER — SODIUM CHLORIDE 0.9 % IV BOLUS (SEPSIS)
1000.0000 mL | Freq: Once | INTRAVENOUS | Status: AC
  Administered 2017-07-06: 1000 mL via INTRAVENOUS

## 2017-07-06 MED ORDER — SALINE SPRAY 0.65 % NA SOLN
1.0000 | NASAL | Status: DC | PRN
  Filled 2017-07-06: qty 44

## 2017-07-06 MED ORDER — ACETAMINOPHEN 325 MG PO TABS
650.0000 mg | ORAL_TABLET | Freq: Four times a day (QID) | ORAL | Status: DC | PRN
  Administered 2017-07-07 – 2017-07-08 (×4): 650 mg via ORAL
  Filled 2017-07-06 (×4): qty 2

## 2017-07-06 MED ORDER — ONDANSETRON HCL 4 MG/2ML IJ SOLN: 4.0000 mg | Freq: Four times a day (QID) | INTRAMUSCULAR | Status: DC | PRN

## 2017-07-06 MED ORDER — ONDANSETRON HCL 4 MG PO TABS: 4.0000 mg | ORAL_TABLET | Freq: Four times a day (QID) | ORAL | Status: DC | PRN

## 2017-07-06 NOTE — ED Notes (Signed)
Date and time results received: 07/06/17 1634 (use smartphrase ".now" to insert current time)  Test: WBC Critical Value: 0.9  Name of Provider Notified: [place]  Orders Received? Or Actions Taken?: Actions Taken: Notified Primary school teacher, Therapist, sports

## 2017-07-06 NOTE — ED Provider Notes (Signed)
[facility] DEPT Provider Note   CSN: [id number] Arrival date & time: 07/06/17  1516     History   Chief Complaint Chief Complaint  Patient presents with  . Fever    cancer patient    HPI Ana Wu is a 36 y.o. female.  Pt presents to the ED today with fever.  She is currently getting chemo (taxol and carboplatin) at [facility] for breast cancer.  She last had chemo on 11/20.  The pt developed chills last night.  She took her temp today at 1330 and it was 100.4.  She called her oncologist (Dr. Harden Mo)  at [facility] who told her to come here to get her blood counts checked and cultures.  The pt denies any pain.  She has not had a runny nose or cough.      Past Medical History:  Diagnosis Date  . Cancer [facility])    Breast Right     Patient Active Problem List   Diagnosis Date Noted  . Febrile neutropenia ([place]) 05/14/2017  . Malignant neoplasm of lower-outer quadrant of right breast of female, estrogen receptor positive ([place]) 03/17/2017  . Postpartum care following vaginal delivery (8/3) 03/15/2016  . Pregnancy 03/14/2016  . Normal labor 03/14/2016  . Rh negative status during pregnancy 03/14/2016    Past Surgical History:  Procedure Laterality Date  . PORTACATH PLACEMENT Left 03/27/2017   Procedure: INSERTION PORT-A-CATH;  Surgeon: Stark Klein, MD;  Location: [facility];  Service: General;  Laterality: Left;  . WISDOM TOOTH EXTRACTION      OB History    Gravida Para Term Preterm AB Living   2 1 1     1    SAB TAB Ectopic Multiple Live Births         0 1       Home Medications    Prior to Admission medications   Medication Sig Start Date End Date Taking? Authorizing Provider  cyanocobalamin 100 MCG tablet Take 100 mcg by mouth daily.   Yes [provider]  hydrocortisone (ANUSOL-HC) 2.5 % rectal cream Place 1 application rectally daily as needed for hemorrhoids.    Yes [provider]  ibuprofen (ADVIL,MOTRIN) 200 MG  tablet Take 400 mg by mouth every 8 (eight) hours as needed (for pain.).   Yes [provider]  lidocaine-prilocaine (EMLA) cream Apply to affected area once 04/01/17  Yes Nicholas Lose, MD  LORazepam (ATIVAN) 0.5 MG tablet Take 1 tablet (0.5 mg total) by mouth at bedtime. Patient taking differently: Take 0.5 mg by mouth at bedtime as needed for sleep.  04/01/17  Yes Nicholas Lose, MD  polyethylene glycol (MIRALAX / GLYCOLAX) packet Take 17 g by mouth daily as needed for moderate constipation.   Yes [provider]  sodium chloride (OCEAN) 0.65 % SOLN nasal spray Place 1 spray into both nostrils as needed for congestion.   Yes [provider]  dexamethasone (DECADRON) 4 MG tablet Take 1 tablet (4 mg total) by mouth daily. 1 tab daily for 2 days starting day after chemo with food Patient not taking: Reported on 07/06/2017 04/01/17   Nicholas Lose, MD  ondansetron (ZOFRAN) 8 MG tablet Take 1 tablet (8 mg total) by mouth 2 (two) times daily as needed. Start on the third day after chemotherapy. Patient not taking: Reported on 07/06/2017 04/01/17   Nicholas Lose, MD  prochlorperazine (COMPAZINE) 10 MG tablet Take 1 tablet (10 mg total) by mouth every 6 (six) hours as needed (  Nausea or vomiting). Patient not taking: Reported on 07/06/2017 04/01/17   Nicholas Lose, MD    Family History Family History  Problem Relation Age of Onset  . Pancreatic cancer Maternal Grandmother   . Lung cancer Paternal Grandmother   . Prostate cancer Paternal Grandfather   . Breast cancer Paternal Aunt   . Breast cancer Paternal Aunt     Social History Social History   Tobacco Use  . Smoking status: Never Smoker  . Smokeless tobacco: Never Used  Substance Use Topics  . Alcohol use: Yes    Comment: occas  . Drug use: No     Allergies   No known allergies   Review of Systems Review of Systems  Constitutional: Positive for fever.  All other systems reviewed and are  negative.    Physical Exam Updated Vital Signs BP 134/69 (BP Location: Left Arm)   Pulse 92   Temp 98.5 F (36.9 C)   Resp 16   LMP 04/17/2017   Physical Exam  Constitutional: She is oriented to person, place, and time. She appears well-developed and well-nourished.  HENT:  Head: Normocephalic and atraumatic.  Right Ear: External ear normal.  Left Ear: External ear normal.  Nose: Nose normal.  Mouth/Throat: Oropharynx is clear and moist.  Eyes: Conjunctivae and EOM are normal. Pupils are equal, round, and reactive to light.  Neck: Normal range of motion. Neck supple.  Cardiovascular: Normal rate, regular rhythm, normal heart sounds and intact distal pulses.  Pulmonary/Chest: Effort normal and breath sounds normal.  Abdominal: Soft. Bowel sounds are normal.  Musculoskeletal: Normal range of motion.  Neurological: She is alert and oriented to person, place, and time.  Skin: Skin is warm. Capillary refill takes less than 2 seconds.  Psychiatric: She has a normal mood and affect. Her behavior is normal. Judgment and thought content normal.  Nursing note and vitals reviewed.    ED Treatments / Results  Labs (all labs ordered are listed, but only abnormal results are displayed) Labs Reviewed  COMPREHENSIVE METABOLIC PANEL - Abnormal; Notable for the following components:      Result Value   Alkaline Phosphatase 33 (*)    All other components within normal limits  CBC WITH DIFFERENTIAL/PLATELET - Abnormal; Notable for the following components:   WBC 0.9 (*)    RBC 2.59 (*)    Hemoglobin 8.2 (*)    HCT 23.7 (*)    RDW 16.1 (*)    Neutro Abs 0.7 (*)    Lymphs Abs 0.1 (*)    All other components within normal limits  URINALYSIS, ROUTINE W REFLEX MICROSCOPIC - Abnormal; Notable for the following components:   Protein, ur 30 (*)    Squamous Epithelial / LPF 0-5 (*)    All other components within normal limits  CULTURE, BLOOD (ROUTINE X 2)  CULTURE, BLOOD (ROUTINE X 2)   URINE CULTURE  INFLUENZA PANEL BY PCR (TYPE A & B)  PREGNANCY, URINE  I-STAT CG4 LACTIC ACID, ED    EKG  EKG Interpretation None       Radiology Dg Chest 2 View  Result Date: 07/06/2017 CLINICAL DATA:  37 year old female with fever and body aches since yesterday. Patient is currently being treated for breast cancer. EXAM: CHEST  2 VIEW COMPARISON:  05/14/2017 FINDINGS: Left-sided Port-A-Cath appears in stable position. The cardiomediastinal silhouette is within normal limits. There are no focal parenchymal opacities, pleural effusion or pneumothorax. Visualized osseous structures grossly intact. IMPRESSION: No active cardiopulmonary disease. Electronically Signed  By: [name]   On: 07/06/2017 16:19    Procedures Procedures (including critical care time)  Medications Ordered in ED Medications  ceFEPIme (MAXIPIME) 2 g in dextrose 5 % 50 mL IVPB (2 g Intravenous New Bag/Given 07/06/17 1825)  sodium chloride 0.9 % bolus 1,000 mL (1,000 mLs Intravenous New Bag/Given 07/06/17 1741)     Initial Impression / Assessment and Plan / ED Course  I have reviewed the triage vital signs and the nursing notes.  Pertinent labs & imaging results that were available during my care of the patient were reviewed by me and considered in my medical decision making (see chart for details).  Pt placed in neutropenic precautions as WBC only 0.9.  The pt was d/w Dr. Harden Mo (pt's oncologist at [facility]).  She requested that pt be admitted for IV abx until [place] > 1000.  Pt d/w Dr. Lorin Mercy who will admit.     Final Clinical Impressions(s) / ED Diagnoses   Final diagnoses:  Neutropenic fever ([employer])  Malignant neoplasm of lower-outer quadrant of right breast of female, estrogen receptor positive [facility])    ED Discharge Orders    None       Isla Pence, MD 07/06/17 [phone]

## 2017-07-06 NOTE — ED Notes (Signed)
ED TO INPATIENT HANDOFF REPORT  Name/Age/Gender Ana Wu 36 y.o. female  Code Status Code Status History    Date Active Date Inactive Code Status Order ID Comments User Context   05/14/2017 03:34 05/20/2017 18:58 Full Code [id number]  Rise Patience, MD ED   03/14/2016 19:58 03/16/2016 19:32 Full Code [id number]  Juliene Pina, CNM Inpatient   03/14/2016 10:22 03/14/2016 19:58 Full Code [id number]  Wendie Agreste, RN Inpatient      Home/SNF/Other Home  Chief Complaint (chemo pt ) fever 100.4  Level of Care/Admitting Diagnosis ED Disposition    ED Disposition Condition [facility] Area: [facility] [[id number]]  Level of Care: Med-Surg [16]  Diagnosis: Neutropenic fever ([place]) [335456]  Admitting Physician: Karmen Bongo [[id number]]  Attending Physician: Karmen Bongo [[id number]]  Estimated length of stay: 3 - 4 days  Certification:: I certify this patient will need inpatient services for at least 2 midnights  PT Class (Do Not Modify): Inpatient [101]  PT Acc Code (Do Not Modify): Private [1]       Medical History Past Medical History:  Diagnosis Date  . Cancer ([place]) 02/2017   Breast Right     Allergies Allergies  Allergen Reactions  . No Known Allergies     IV Location/Drains/Wounds Patient Lines/Drains/Airways Status   Active Line/Drains/Airways    Name:   Placement date:   Placement time:   Site:   Days:   Implanted Port 03/27/17 Left Chest   03/27/17    1531    Chest   101   Implanted Port 03/27/17 Left Chest   03/27/17    0046    Chest   101   Incision (Closed) 03/27/17 Chest Left   03/27/17    1455     101          Labs/Imaging Results for orders placed or performed during the hospital encounter of 07/06/17 (from the past 48 hour(s))  Urinalysis, Routine w reflex microscopic     Status: Abnormal   Collection Time: 07/06/17  3:43 PM  Result Value Ref Range   Color, Urine YELLOW YELLOW   APPearance CLEAR CLEAR    Specific Gravity, Urine 1.027 1.005 - 1.030   pH 6.0 5.0 - 8.0   Glucose, UA NEGATIVE NEGATIVE mg/dL   Hgb urine dipstick NEGATIVE NEGATIVE   Bilirubin Urine NEGATIVE NEGATIVE   Ketones, ur NEGATIVE NEGATIVE mg/dL   Protein, ur 30 (A) NEGATIVE mg/dL   Nitrite NEGATIVE NEGATIVE   Leukocytes, UA NEGATIVE NEGATIVE   RBC / HPF 0-5 0 - 5 RBC/hpf   WBC, UA 0-5 0 - 5 WBC/hpf   Bacteria, UA NONE SEEN NONE SEEN   Squamous Epithelial / LPF 0-5 (A) NONE SEEN   Mucus PRESENT    Hyaline Casts, UA PRESENT    Ca Oxalate Crys, UA PRESENT   Pregnancy, urine     Status: None   Collection Time: 07/06/17  3:43 PM  Result Value Ref Range   Preg Test, Ur NEGATIVE NEGATIVE    Comment:        THE SENSITIVITY OF THIS METHODOLOGY IS >20 mIU/mL.   Influenza panel by PCR (type A & B)     Status: None   Collection Time: 07/06/17  3:49 PM  Result Value Ref Range   Influenza A By PCR NEGATIVE NEGATIVE   Influenza B By PCR NEGATIVE NEGATIVE    Comment: (NOTE) The Xpert Xpress Flu assay is intended  as an aid in the diagnosis of  influenza and should not be used as a sole basis for treatment.  This  assay is FDA approved for nasopharyngeal swab specimens only. Nasal  washings and aspirates are unacceptable for Xpert Xpress Flu testing.   Comprehensive metabolic panel     Status: Abnormal   Collection Time: 07/06/17  3:55 PM  Result Value Ref Range   Sodium 135 135 - 145 mmol/L   Potassium 3.8 3.5 - 5.1 mmol/L   Chloride 102 101 - 111 mmol/L   CO2 26 22 - 32 mmol/L   Glucose, Bld 98 65 - 99 mg/dL   BUN 10 6 - 20 mg/dL   Creatinine, Ser 0.71 0.44 - 1.00 mg/dL   Calcium 9.0 8.9 - 10.3 mg/dL   Total Protein 6.9 6.5 - 8.1 g/dL   Albumin 4.0 3.5 - 5.0 g/dL   AST 29 15 - 41 U/L   ALT 33 14 - 54 U/L   Alkaline Phosphatase 33 (L) 38 - 126 U/L   Total Bilirubin 0.8 0.3 - 1.2 mg/dL   GFR calc non Af Amer >60 >60 mL/min   GFR calc Af Amer >60 >60 mL/min    Comment: (NOTE) The eGFR has been calculated using  the CKD EPI equation. This calculation has not been validated in all clinical situations. eGFR's persistently <60 mL/min signify possible Chronic Kidney Disease.    Anion gap 7 5 - 15  CBC with Differential     Status: Abnormal   Collection Time: 07/06/17  3:55 PM  Result Value Ref Range   WBC 0.9 (LL) 4.0 - 10.5 K/uL    Comment: RESULT REPEATED AND VERIFIED CRITICAL RESULT CALLED TO, READ BACK BY AND VERIFIED WITH: THORNTON,S AT 1630 ON [id number] BY HOOKER,B    RBC 2.59 (L) 3.87 - 5.11 MIL/uL   Hemoglobin 8.2 (L) 12.0 - 15.0 g/dL   HCT 23.7 (L) 36.0 - 46.0 %   MCV 91.5 78.0 - 100.0 fL   MCH 31.7 26.0 - 34.0 pg   MCHC 34.6 30.0 - 36.0 g/dL   RDW 16.1 (H) 11.5 - 15.5 %   Platelets 156 150 - 400 K/uL   Neutrophils Relative % 78 %   Lymphocytes Relative 14 %   Monocytes Relative 6 %   Eosinophils Relative 1 %   Basophils Relative 1 %   Neutro Abs 0.7 (L) 1.7 - 7.7 K/uL   Lymphs Abs 0.1 (L) 0.7 - 4.0 K/uL   Monocytes Absolute 0.1 0.1 - 1.0 K/uL   Eosinophils Absolute 0.0 0.0 - 0.7 K/uL   Basophils Absolute 0.0 0.0 - 0.1 K/uL  I-Stat CG4 Lactic Acid, ED     Status: None   Collection Time: 07/06/17  4:04 PM  Result Value Ref Range   Lactic Acid, Venous 1.16 0.5 - 1.9 mmol/L   Dg Chest 2 View  Result Date: 07/06/2017 CLINICAL DATA:  36 year old female with fever and body aches since yesterday. Patient is currently being treated for breast cancer. EXAM: CHEST  2 VIEW COMPARISON:  05/14/2017 FINDINGS: Left-sided Port-A-Cath appears in stable position. The cardiomediastinal silhouette is within normal limits. There are no focal parenchymal opacities, pleural effusion or pneumothorax. Visualized osseous structures grossly intact. IMPRESSION: No active cardiopulmonary disease. Electronically Signed   By: [name]   On: 07/06/2017 16:19    Pending Labs Unresulted Labs (From admission, onward)   Start     Ordered   07/06/17 1550  Urine culture  STAT,   STAT     07/06/17 1549    07/06/17 1549  Culture, blood (routine x 2)  BLOOD CULTURE X 2,   STAT     07/06/17 1548      Vitals/Pain Today's Vitals   07/06/17 1528 07/06/17 1531  BP: 134/69   Pulse: 92   Resp: 16   Temp: 98.5 F (36.9 C)   PainSc:  0-No pain    Isolation Precautions Protective Precautions  Medications Medications  sodium chloride 0.9 % bolus 1,000 mL (1,000 mLs Intravenous New Bag/Given 07/06/17 1741)  ceFEPIme (MAXIPIME) 2 g in dextrose 5 % 50 mL IVPB (2 g Intravenous New Bag/Given 07/06/17 1825)    Mobility walks

## 2017-07-06 NOTE — ED Triage Notes (Signed)
Pt reports a fever and body aches starting yesterday. She states that today 1330 her fever reached 100.4. She called her oncologist at [facility] who recommended that she come here to get her blood counts checked and cultures drawn. A&Ox4. Ambulatory. Pt took ibuprofen PTA. Last treatment on Tuesday. Hx breast cancer.

## 2017-07-06 NOTE — H&P (Signed)
History and Physical    Ana Wu RCV:[id number] DOB: February 10, 1981 DOA: 07/06/2017  PCP: Leighton Ruff, MD Consultants:  [place] Patient coming from:  Home - lives with husband and 47modaughter; NDonald [place]se Husband, [phone] Chief Complaint: fever  HPI: Ana MAHARAJis a 36y.o. 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  Yesterday, she developed fever to 99.3.  Last night with chills and body aches and it continued today.  Temp up to 100.4 this afternoon and so [facility] ER [facility].  Occasional dry cough that is somewhat different from usual.  She did have some constipation last week for a few days but this is improved now.  No other symptoms including urinary symptoms.   ED Course: Breast cancer with fever to 100.4, neutropenia.  [facility] (Dr. WHarden Mo suggests admission for neutropenic fever until AJennette>1000.  Given Cefepime.  Review of Systems: As per HPI; otherwise review of systems reviewed and negative.   Ambulatory Status:  Ambulates without assistance  Past Medical History:  Diagnosis Date  . [place] 02[place]   Breast Right     Past Surgical History:  Procedure Laterality Date  . BREAST FIBROADENOMA SURGERY     2001, 2007  . PORTACATH PLACEMENT Left 03/27/2017   Procedure: INSERTION PORT-A-CATH;  Surgeon: BStark Klein MD;  [place]  Service: General;  Laterality: Left;  . WISDOM TOOTH EXTRACTION      Social History   Socioeconomic History  . Marital status: Married    Spouse name: Not on file  . Number of children: Not on file  . Years of education: Not on file  . Highest education level: Not on file  Social Needs  . Financial resource strain: Not on file  . Food insecurity - worry: Not on file  . Food insecurity - inability:  Not on file  . Transportation needs - medical: Not on file  . Transportation needs - non-medical: Not on file  Occupational History  . Not on file  Tobacco Use  . Smoking status: Never Smoker  . Smokeless tobacco: Never Used  Substance and Sexual Activity  . Alcohol use: Yes    Comment: occas  . Drug use: No  . Sexual activity: Yes    Partners: Male  Other Topics Concern  . Not on file  Social History Narrative  . Not on file    Allergies  Allergen Reactions  . No Known Allergies     Family History  Problem Relation Age of Onset  . Pancreatic cancer Maternal Grandmother   . Lung cancer Paternal Grandmother   . Prostate cancer Paternal Grandfather   . Breast cancer Paternal Aunt   . Breast cancer Paternal Aunt     Prior to Admission medications   Medication Sig Start Date End Date Taking? Authorizing Provider  cyanocobalamin 100 MCG tablet Take 100 mcg by mouth daily.   Yes [provider]  hydrocortisone (ANUSOL-HC) 2.5 % rectal cream Place 1 application rectally daily as needed for hemorrhoids.    Yes [provider]  ibuprofen (ADVIL,MOTRIN) 200 MG tablet Take 400 mg by mouth every 8 (eight) hours as needed (for pain.).   Yes [provider]  lidocaine-prilocaine (EMLA) cream Apply to affected area once 04/01/17  Yes GNicholas Lose MD  LORazepam (ATIVAN) 0.5 MG tablet Take 1 tablet (0.5 mg total) by mouth at bedtime. Patient taking differently: Take 0.5 mg by mouth at bedtime as needed for sleep.  04/01/17  Yes Nicholas Lose, MD  polyethylene glycol (MIRALAX / GLYCOLAX) packet Take 17 g by mouth daily as needed for moderate constipation.   Yes [provider]  sodium chloride (OCEAN) 0.65 % SOLN nasal spray Place 1 spray into both nostrils as needed for congestion.   Yes [provider]  dexamethasone (DECADRON) 4 MG tablet Take 1 tablet (4 mg total) by mouth daily. 1 tab daily for 2 days starting day after chemo with  food Patient not taking: Reported on 07/06/2017 04/01/17   Nicholas Lose, MD  ondansetron (ZOFRAN) 8 MG tablet Take 1 tablet (8 mg total) by mouth 2 (two) times daily as needed. Start on the third day after chemotherapy. Patient not taking: Reported on 07/06/2017 04/01/17   Nicholas Lose, MD  prochlorperazine (COMPAZINE) 10 MG tablet Take 1 tablet (10 mg total) by mouth every 6 (six) hours as needed (Nausea or vomiting). Patient not taking: Reported on 07/06/2017 04/01/17   Nicholas Lose, MD    Physical Exam: Vitals:   07/06/17 1528 07/06/17 2024  BP: 134/69 125/74  Pulse: 92 (!) 101  Resp: 16 18  Temp: 98.5 F (36.9 C) (!) 100.4 F (38 C)  TempSrc:  Oral  SpO2:  94%     General:  Appears calm and comfortable and is NAD; + alopecia Eyes:  PERRL, EOMI, normal lids, iris ENT:  grossly normal hearing, lips & tongue, mmm; appropriate dentition Neck:  no LAD, masses or thyromegaly Cardiovascular:  RRR, no m/r/g. No LE edema.  Respiratory:   CTA bilaterally with no wheezes/rales/rhonchi.  Normal respiratory effort. Abdomen:  soft, NT, ND, NABS Back:   normal alignment, no CVAT Skin:  no rash or induration seen on limited exam Musculoskeletal:  grossly normal tone BUE/BLE, good ROM, no bony abnormality Psychiatric:  grossly normal mood and affect, speech fluent and appropriate, AOx3 Neurologic:  CN 2-12 grossly intact, moves all extremities in coordinated fashion, sensation intact    Radiological Exams on Admission: Dg Chest 2 View  Result Date: 07/06/2017 CLINICAL DATA:  36 year old female with fever and body aches since yesterday. Patient is currently being treated for breast cancer. EXAM: CHEST  2 VIEW COMPARISON:  05/14/2017 FINDINGS: Left-sided Port-A-Cath appears in stable position. The cardiomediastinal silhouette is within normal limits. There are no focal parenchymal opacities, pleural effusion or pneumothorax. Visualized osseous structures grossly intact. IMPRESSION: No  active cardiopulmonary disease. Electronically Signed   By: [name]   On: 07/06/2017 16:19    EKG: not done   Labs on Admission: I have personally reviewed the available labs and imaging studies at the time of the admission.  Pertinent labs:   Lactate 1.16 WBC 0.9 Hgb 8.2; 9.0 on 11/20, 8.6 on 11/13 - normocytic Flu negative Upreg negative UA: 30 protein Blood cultures pending Urine culture has been collected  Assessment/Plan Principal Problem:   Neutropenic fever (HCC) Active Problems:   Malignant neoplasm of lower-outer quadrant of right breast of female, estrogen receptor positive (HCC)   Normocytic anemia   Neutropenic fever -Patient presenting with fever and neutropenia -She is undergoing chemotherapy -Empiric coverage with Cefepime as per febrile neutropenia focused order set -Blood cultures are pending -Urine culture has been collected  -No obvious source of fever at this time -Treat fever with Tylenol/Motrin as needed -Neutropenic precautions  Breast cancer -Followed by Dr[facility] Harden Mo at [facility] -She is actively undergoing chemotherapy and is expected to complete chemo in 1/19 with plans for bilateral mastectomy in Jan-Feb 2019 followed by radiation -She required an injection of GCSF during her prior hospitalization for neutropenic fever (10/3-9) -For now will follow without additional treatments for this condition  Anemia -Likely further depression of RBC due to chemo effects -No indication for transfusion at this time -Will recheck CBC in AM   DVT prophylaxis: Lovenox  Code Status:  Full - confirmed with patient/family Family Communication: Friend present at bedside throughout evaluation  Disposition Plan:  Home once clinically improved Consults called: [facility] by telephone  Admission status: Admit - It is my clinical opinion that admission to INPATIENT is reasonable and necessary because this patient will require at least 2  midnights in the hospital to treat this condition based on the medical complexity of the problems presented.  Given the aforementioned information, the predictability of an adverse outcome is felt to be significant.    Karmen Bongo MD [facility]  If note is complete, please contact covering daytime or nighttime physician. [email or web address] Password [email or web address]  07/06/2017, 9:07 PM

## 2017-07-07 LAB — CBC
HCT: 23.7 % — ABNORMAL LOW (ref 36.0–46.0)
Hemoglobin: 8 g/dL — ABNORMAL LOW (ref 12.0–15.0)
MCH: 30.9 pg (ref 26.0–34.0)
MCHC: 33.8 g/dL (ref 30.0–36.0)
MCV: 91.5 fL (ref 78.0–100.0)
PLATELETS: 124 10*3/uL — AB (ref 150–400)
RBC: 2.59 MIL/uL — ABNORMAL LOW (ref 3.87–5.11)
RDW: 16.2 % — AB (ref 11.5–15.5)
WBC: 0.5 10*3/uL — AB (ref 4.0–10.5)

## 2017-07-07 LAB — BASIC METABOLIC PANEL
Anion gap: 4 — ABNORMAL LOW (ref 5–15)
BUN: 9 mg/dL (ref 6–20)
CALCIUM: 8.6 mg/dL — AB (ref 8.9–10.3)
CO2: 26 mmol/L (ref 22–32)
CREATININE: 0.51 mg/dL (ref 0.44–1.00)
Chloride: 107 mmol/L (ref 101–111)
GFR calc Af Amer: >60 mL/min (ref >60)
Glucose, Bld: 96 mg/dL (ref 65–99)
POTASSIUM: 3.8 mmol/L (ref 3.5–5.1)
SODIUM: 137 mmol/L (ref 135–145)

## 2017-07-07 LAB — PROCALCITONIN: PROCALCITONIN: 0.15 ng/mL

## 2017-07-07 MED ORDER — TBO-FILGRASTIM 480 MCG/0.8ML ~~LOC~~ SOSY
480.0000 ug | PREFILLED_SYRINGE | Freq: Every day | SUBCUTANEOUS | Status: DC
  Administered 2017-07-07: 480 ug via SUBCUTANEOUS
  Filled 2017-07-07 (×2): qty 0.8

## 2017-07-07 NOTE — Progress Notes (Signed)
_0 @        PROGRESS NOTE                                                                                                                                                                                                             Patient Demographics:    Ana Wu, is a 36 y.o. female, DOB - 1980/12/18, AXE:[id number]  Admit date - 07/06/2017   Admitting Physician Karmen Bongo, MD  Outpatient Primary MD for the patient is Leighton Ruff, MD  LOS - 1  Chief Complaint  Patient presents with  . Fever    cancer patient       Brief Narrative  Ana Wu is a 36 y.o. 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 Harden Mo and her last chemo treatment was 5 days before presentation, in the ER she was diagnosed with febrile neutropenia with no clear source and admitted for further treatment.    Subjective:    Ana Wu today has, No headache, No chest pain, No abdominal pain - No Nausea, No new weakness tingling or numbness, No Cough - SOB.     Assessment  & Plan :     1.  Febrile neutropenia with no clear source.  He feels sick contacts over the last week who had viral URIs including husband.  He could have had a viral infection, chest x-ray UA clear, I confirmed with the patient and looked at the records she did not get Neulasta etc. during her last chemotherapy, case discussed with Dr. Payton Mccallum her oncologist locally, he would be started on Granix daily, monitor cultures, continue empiric IV Maxipime.  Monitor.  She appears nontoxic.  2.  Breast cancer. Grade 3 invasive ductal carcinoma of the right breast, ER+ (20%), PR+ (30%), HER2 negative -[facility] Dr. Tonita Phoenix after discharge.  3.  Severe neutropenia with pancytopenia.  Other counts are stable will monitor.  Granix for  neutropenia.    Diet : Diet regular Room service appropriate? Yes; Fluid consistency: Thin    Family Communication  : none  Code Status :  Full  Disposition Plan  : Stay in the hospital until neutropenia improved  Consults  : Dr. Lindi Adie oncologist over the phone  Procedures  : None  DVT Prophylaxis  :  Lovenox    Lab Results  Component Value Date   PLT 124 (L) 07/07/2017  Inpatient Medications  Scheduled Meds: . docusate sodium  100 mg Oral BID  . enoxaparin (LOVENOX) injection  40 mg Subcutaneous QHS  . Tbo-filgastrim (GRANIX) SQ  480 mcg Subcutaneous Daily   Continuous Infusions: . ceFEPime (MAXIPIME) IV Stopped (07/07/17 0554)   PRN Meds:.acetaminophen **OR** acetaminophen, ibuprofen, LORazepam, ondansetron **OR** ondansetron (ZOFRAN) IV, polyethylene glycol, sodium chloride  Antibiotics  :    Anti-infectives (From admission, onward)   Start     Dose/Rate Route Frequency Ordered Stop   07/07/17 0100  ceFEPIme (MAXIPIME) 2 g in dextrose 5 % 50 mL IVPB     2 g 100 mL/hr over 30 Minutes Intravenous Every 8 hours 07/06/17 2218     07/06/17 1815  ceFEPIme (MAXIPIME) 2 g in dextrose 5 % 50 mL IVPB     2 g 100 mL/hr over 30 Minutes Intravenous  Once 07/06/17 1800 07/06/17 2034         Objective:   Vitals:   07/06/17 1528 07/06/17 2024 07/06/17 2157 07/07/17 0442  BP: 134/69 125/74 128/70 104/76  Pulse: 92 (!) 101 (!) 122 76  Resp: _0 Temp: 98.5 F (36.9 C) (!) 100.4 F (38 C) (!) 100.5 F (38.1 C) 98.7 F (37.1 C)  TempSrc:  Oral Oral Oral  SpO2:  94% 99% 98%  Weight:   74 kg (163 lb 2.3 oz)   Height:   _1  (1.626 m)     Wt Readings from Last 3 Encounters:  07/06/17 74 kg (163 lb 2.3 oz)  05/14/17 74.3 kg (163 lb 12.8 oz)  03/27/17 78 kg (172 lb)     Intake/Output Summary (Last 24 hours) at 07/07/2017 0941 Last data filed at 07/07/2017 0600 Gross per 24 hour  Intake 1175 ml  Output -  Net 1175 ml     Physical Exam  Awake  Alert, Oriented X 3, No new F.N deficits, Normal affect [place].AT,PERRAL Supple Neck,No JVD, No cervical lymphadenopathy appriciated.  Left subclavian Port-A-Cath site appears stable and clean Symmetrical Chest wall movement, Good air movement bilaterally, CTAB RRR,No Gallops,Rubs or new Murmurs, No Parasternal Heave +ve B.Sounds, Abd Soft, No tenderness, No organomegaly appriciated, No rebound - guarding or rigidity. No Cyanosis, Clubbing or edema, No new Rash or bruise      Data Review:    CBC Recent Labs  Lab 07/06/17 1555 07/07/17 0500  WBC 0.9* 0.5*  HGB 8.2* 8.0*  HCT 23.7* 23.7*  PLT 156 124*  MCV 91.5 91.5  MCH 31.7 30.9  MCHC 34.6 33.8  RDW 16.1* 16.2*  LYMPHSABS 0.1*  --   MONOABS 0.1  --   EOSABS 0.0  --   BASOSABS 0.0  --     Chemistries  Recent Labs  Lab 07/06/17 1555 07/07/17 0500  NA 135 137  K 3.8 3.8  CL 102 107  CO2 26 26  GLUCOSE 98 96  BUN 10 9  CREATININE 0.71 0.51  CALCIUM 9.0 8.6*  AST 29  --   ALT 33  --   ALKPHOS 33*  --   BILITOT 0.8  --    ------------------------------------------------------------------------------------------------------------------ No results for input(s): CHOL, HDL, LDLCALC, TRIG, CHOLHDL, LDLDIRECT in the last 72 hours.  No results found for: HGBA1C ------------------------------------------------------------------------------------------------------------------ No results for input(s): TSH, T4TOTAL, T3FREE, THYROIDAB in the last 72 hours.  Invalid input(s): FREET3 ------------------------------------------------------------------------------------------------------------------ No results for input(s): VITAMINB12, FOLATE, FERRITIN, TIBC, IRON, RETICCTPCT in the last 72 hours.  Coagulation profile No results for input(s): INR, PROTIME  in the last 168 hours.  No results for input(s): DDIMER in the last 72 hours.  Cardiac Enzymes No results for input(s): CKMB, TROPONINI, MYOGLOBIN in the last 168  hours.  Invalid input(s): CK ------------------------------------------------------------------------------------------------------------------ No results found for: BNP  Micro Results No results found for this or any previous visit (from the past 240 hour(s)).  Radiology Reports Dg Chest 2 View  Result Date: 07/06/2017 CLINICAL DATA:  36 year old female with fever and body aches since yesterday. Patient is currently being treated for breast cancer. EXAM: CHEST  2 VIEW COMPARISON:  05/14/2017 FINDINGS: Left-sided Port-A-Cath appears in stable position. The cardiomediastinal silhouette is within normal limits. There are no focal parenchymal opacities, pleural effusion or pneumothorax. Visualized osseous structures grossly intact. IMPRESSION: No active cardiopulmonary disease. Electronically Signed   By: [name]   On: 07/06/2017 16:19    Time Spent in minutes  30   Lala Lund M.D on 07/07/2017 at 9:41 AM  Between 7am to 7pm - Pager - [phone] ( page via [email or web address], text pages only, please mention full 10 digit call back number). After 7pm go to [email or web address] - password [email or web address]

## 2017-07-07 NOTE — Care Management Note (Signed)
Case Management Note  Patient Details  Name: Ana Wu MRN: [id number] Date of Birth: June 16, 1981  Subjective/Objective:    36 yo admitted with Neutropenic fever. Hx of  Breast cancer                  Action/Plan: From home.  Expected Discharge Date:  07/10/17               Expected Discharge Plan:  Home/Self Care  In-House Referral:     Discharge planning Services  CM Consult  Post Acute Care Choice:    Choice offered to:     DME Arranged:    DME Agency:     HH Arranged:    HH Agency:     Status of Service:  In process, will continue to follow  If discussed at Long Length of Stay Meetings, dates discussed:    Additional Comments:  Lynnell Catalan, RN 07/07/2017, 10:56 AM

## 2017-07-08 LAB — BASIC METABOLIC PANEL
ANION GAP: 6 (ref 5–15)
BUN: 7 mg/dL (ref 6–20)
CO2: 26 mmol/L (ref 22–32)
Calcium: 8.9 mg/dL (ref 8.9–10.3)
Chloride: 106 mmol/L (ref 101–111)
Creatinine, Ser: 0.56 mg/dL (ref 0.44–1.00)
GFR calc non Af Amer: >60 mL/min (ref >60)
Glucose, Bld: 93 mg/dL (ref 65–99)
POTASSIUM: 3.7 mmol/L (ref 3.5–5.1)
SODIUM: 138 mmol/L (ref 135–145)

## 2017-07-08 LAB — CBC WITH DIFFERENTIAL/PLATELET
BASOS PCT: 0 %
Basophils Absolute: 0 10*3/uL (ref 0.0–0.1)
EOS ABS: 0 10*3/uL (ref 0.0–0.7)
Eosinophils Relative: 0 %
HEMATOCRIT: 23.8 % — AB (ref 36.0–46.0)
HEMOGLOBIN: 8.2 g/dL — AB (ref 12.0–15.0)
LYMPHS PCT: 10 %
Lymphs Abs: 0.3 10*3/uL — ABNORMAL LOW (ref 0.7–4.0)
MCH: 31.9 pg (ref 26.0–34.0)
MCHC: 34.5 g/dL (ref 30.0–36.0)
MCV: 92.6 fL (ref 78.0–100.0)
MONO ABS: 0.2 10*3/uL (ref 0.1–1.0)
Monocytes Relative: 6 %
NEUTROS PCT: 84 %
Neutro Abs: 2.2 10*3/uL (ref 1.7–7.7)
Platelets: 98 10*3/uL — ABNORMAL LOW (ref 150–400)
RBC: 2.57 MIL/uL — AB (ref 3.87–5.11)
RDW: 16.6 % — ABNORMAL HIGH (ref 11.5–15.5)
WBC: 2.7 10*3/uL — AB (ref 4.0–10.5)

## 2017-07-08 LAB — MAGNESIUM: MAGNESIUM: 1.5 mg/dL — AB (ref 1.7–2.4)

## 2017-07-08 MED ORDER — TBO-FILGRASTIM 480 MCG/0.8ML ~~LOC~~ SOSY
480.0000 ug | PREFILLED_SYRINGE | Freq: Every day | SUBCUTANEOUS | Status: AC
  Filled 2017-07-08: qty 0.8

## 2017-07-08 NOTE — Progress Notes (Signed)
0100--temp 100.3.  Ibuprofen 400 mg given

## 2017-07-08 NOTE — Progress Notes (Signed)
2330--temp 102.7.  Tylenol 650 mg given po

## 2017-07-08 NOTE — Progress Notes (Signed)
@IPLOG @        PROGRESS NOTE                                                                                                                                                                                                             Patient Demographics:    Ana Wu, is a 36 y.o. female, DOB - [birth year], HBZ:[id number]  Admit date - 07/06/2017   Admitting Physician Karmen Bongo, MD  Outpatient Primary MD for the patient is Leighton Ruff, MD  LOS - 2  Chief Complaint  Patient presents with  . Fever    cancer patient       Brief Narrative  Ana Wu is a 36 y.o. 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 Harden Mo and her last chemo treatment was 5 days before presentation, in the ER she was diagnosed with febrile neutropenia with no clear source and admitted for further treatment.    Subjective:   Patient in bed, appears comfortable, denies any headache, no fever, no chest pain or pressure, no shortness of breath , no abdominal pain. No focal weakness.  Last fever until late last evening.    Assessment  & Plan :     1.  Febrile neutropenia with no clear source.  She did have a few sick contacts over the last week who had viral URIs including husband.  She certainly could have had a viral infection, chest x-ray UA clear, I confirmed with the patient and looked at the records she did not get Neulasta etc. during her last chemotherapy at [facility], I also discussed her case with her previous oncologist Dr. Payton Mccallum over the phone on 07/07/2017, subsequently she was started on Granix daily, continue IV Maxipime, so far cultures are negative, last temperature of 102 was around 3 PM yesterday, neutrophil count has improved considerably, will stop Granix today.  If no more fevers today and cultures  remain negative may be discharged tomorrow on oral Levaquin for 4 more days.  2.  Breast cancer. Grade 3 invasive ductal carcinoma of the right breast, ER+ (20%), PR+ (30%), HER2 negative -[facility] oncology Dr. Tonita Phoenix after discharge.  3.  Severe neutropenia with pancytopenia.  Other counts are stable will monitor.  Granix for neutropenia 1 more dose today, since she is pancytopenic with a drop in counts will hold further Lovenox and switch her to SCDs.  Diet : Diet regular Room service appropriate? Yes; Fluid consistency: Thin    Family Communication  : none  Code Status :  Full  Disposition Plan  : Stay in the hospital until neutropenia improved  Consults  : Dr. Lindi Adie oncologist over the phone  Procedures  : None  DVT Prophylaxis  : Stop Lovenox and switch to SCDs  Lab Results  Component Value Date   PLT 98 (L) 07/08/2017    Inpatient Medications  Scheduled Meds: . docusate sodium  100 mg Oral BID  . enoxaparin (LOVENOX) injection  40 mg Subcutaneous QHS  . Tbo-filgastrim (GRANIX) SQ  480 mcg Subcutaneous Daily   Continuous Infusions: . ceFEPime (MAXIPIME) IV 2 g (07/08/17 0522)   PRN Meds:.acetaminophen **OR** acetaminophen, ibuprofen, LORazepam, ondansetron **OR** ondansetron (ZOFRAN) IV, polyethylene glycol, sodium chloride  Antibiotics  :    Anti-infectives (From admission, onward)   Start     Dose/Rate Route Frequency Ordered Stop   07/07/17 0100  ceFEPIme (MAXIPIME) 2 g in dextrose 5 % 50 mL IVPB     2 g 100 mL/hr over 30 Minutes Intravenous Every 8 hours 07/06/17 2218     07/06/17 1815  ceFEPIme (MAXIPIME) 2 g in dextrose 5 % 50 mL IVPB     2 g 100 mL/hr over 30 Minutes Intravenous  Once 07/06/17 1800 07/06/17 2034         Objective:   Vitals:   07/07/17 2031 07/07/17 2330 07/08/17 0100 07/08/17 0431  BP: 122/74   112/82  Pulse: 87   82  Resp: 18   18  Temp: 98.6 F (37 C) (!) 102.7 F (39.3 C) 100.3 F (37.9 C) 98.4 F (36.9 C)  TempSrc: Oral  Oral Oral Oral  SpO2: 100%   100%  Weight:      Height:        Wt Readings from Last 3 Encounters:  07/06/17 74 kg (163 lb 2.3 oz)  05/14/17 74.3 kg (163 lb 12.8 oz)  03/27/17 78 kg (172 lb)     Intake/Output Summary (Last 24 hours) at 07/08/2017 0930 Last data filed at 07/07/2017 1300 Gross per 24 hour  Intake 240 ml  Output -  Net 240 ml     Physical Exam  Awake Alert, Oriented X 3, No new F.N deficits, Normal affect [place].AT,PERRAL Supple Neck,No JVD, No cervical lymphadenopathy appriciated.  Symmetrical Chest wall movement, Good air movement bilaterally, CTAB, Left subclavian Port-A-Cath site appears stable and clean RRR,No Gallops, Rubs or new Murmurs, No Parasternal Heave +ve B.Sounds, Abd Soft, No tenderness, No organomegaly appriciated, No rebound - guarding or rigidity. No Cyanosis, Clubbing or edema, No new Rash or bruise     Data Review:    CBC Recent Labs  Lab 07/06/17 1555 07/07/17 0500 07/08/17 0530  WBC 0.9* 0.5* 2.7*  HGB 8.2* 8.0* 8.2*  HCT 23.7* 23.7* 23.8*  PLT 156 124* 98*  MCV 91.5 91.5 92.6  MCH 31.7 30.9 31.9  MCHC 34.6 33.8 34.5  RDW 16.1* 16.2* 16.6*  LYMPHSABS 0.1*  --  0.3*  MONOABS 0.1  --  0.2  EOSABS 0.0  --  0.0  BASOSABS 0.0  --  0.0    Chemistries  Recent Labs  Lab 07/06/17 1555 07/07/17 0500 07/08/17 0530  NA 135 137 138  K 3.8 3.8 3.7  CL 102 107 106  CO2 26 26 26   GLUCOSE 98 96 93  BUN 10 9 7   CREATININE 0.71 0.51 0.56  CALCIUM 9.0  8.6* 8.9  MG  --   --  1.5*  AST 29  --   --   ALT 33  --   --   ALKPHOS 33*  --   --   BILITOT 0.8  --   --    ------------------------------------------------------------------------------------------------------------------ No results for input(s): CHOL, HDL, LDLCALC, TRIG, CHOLHDL, LDLDIRECT in the last 72 hours.  No results found for: HGBA1C ------------------------------------------------------------------------------------------------------------------ No results for  input(s): TSH, T4TOTAL, T3FREE, THYROIDAB in the last 72 hours.  Invalid input(s): FREET3 ------------------------------------------------------------------------------------------------------------------ No results for input(s): VITAMINB12, FOLATE, FERRITIN, TIBC, IRON, RETICCTPCT in the last 72 hours.  Coagulation profile No results for input(s): INR, PROTIME in the last 168 hours.  No results for input(s): DDIMER in the last 72 hours.  Cardiac Enzymes No results for input(s): CKMB, TROPONINI, MYOGLOBIN in the last 168 hours.  Invalid input(s): CK ------------------------------------------------------------------------------------------------------------------ No results found for: BNP  Micro Results Recent Results (from the past 240 hour(s))  Culture, blood (routine x 2)     Status: None (Preliminary result)   Collection Time: 07/06/17  3:49 PM  Result Value Ref Range Status   Specimen Description BLOOD PORTA CATH  Final   Special Requests   Final    BOTTLES DRAWN AEROBIC AND ANAEROBIC Blood Culture adequate volume   Culture   Final    NO GROWTH < 24 HOURS Performed at [facility], [address] [place], [place] [address]    Report Status PENDING  Incomplete  Culture, blood (routine x 2)     Status: None (Preliminary result)   Collection Time: 07/06/17  3:54 PM  Result Value Ref Range Status   Specimen Description BLOOD RIGHT HAND  Final   Special Requests   Final    BOTTLES DRAWN AEROBIC AND ANAEROBIC Blood Culture adequate volume   Culture   Final    NO GROWTH < 24 HOURS Performed at [facility], [place] [address] [place], [place] [address]    Report Status PENDING  Incomplete    Radiology Reports Dg Chest 2 View  Result Date: 07/06/2017 CLINICAL DATA:  36 year old female with fever and body aches since yesterday. Patient is currently being treated for breast cancer. EXAM: CHEST  2 VIEW COMPARISON:  05/14/2017 FINDINGS: Left-sided Port-A-Cath appears  in stable position. The cardiomediastinal silhouette is within normal limits. There are no focal parenchymal opacities, pleural effusion or pneumothorax. Visualized osseous structures grossly intact. IMPRESSION: No active cardiopulmonary disease. Electronically Signed   By: [name]   On: 07/06/2017 16:19    Time Spent in minutes  30   Lala Lund M.D on 07/08/2017 at 9:30 AM  Between 7am to 7pm - Pager - [phone] ( page via [email or web address], text pages only, please mention full 10 digit call back number). After 7pm go to [email or web address] - password [email or web address]

## 2017-07-09 DIAGNOSIS — Z170 Estrogen receptor positive status [ER+]: Secondary | ICD-10-CM

## 2017-07-09 DIAGNOSIS — D649 Anemia, unspecified: Secondary | ICD-10-CM

## 2017-07-09 DIAGNOSIS — R5081 Fever presenting with conditions classified elsewhere: Secondary | ICD-10-CM

## 2017-07-09 DIAGNOSIS — C50511 Malignant neoplasm of lower-outer quadrant of right female breast: Secondary | ICD-10-CM

## 2017-07-09 DIAGNOSIS — D709 Neutropenia, unspecified: Principal | ICD-10-CM

## 2017-07-09 LAB — CBC WITH DIFFERENTIAL/PLATELET
BASOS ABS: 0.2 10*3/uL — AB (ref 0.0–0.1)
Basophils Relative: 7 %
EOS ABS: 0 10*3/uL (ref 0.0–0.7)
Eosinophils Relative: 0 %
HCT: 24.4 % — ABNORMAL LOW (ref 36.0–46.0)
HEMOGLOBIN: 8.4 g/dL — AB (ref 12.0–15.0)
LYMPHS PCT: 31 %
Lymphs Abs: 0.9 10*3/uL (ref 0.7–4.0)
MCH: 31.8 pg (ref 26.0–34.0)
MCHC: 34.4 g/dL (ref 30.0–36.0)
MCV: 92.4 fL (ref 78.0–100.0)
Monocytes Absolute: 0.5 10*3/uL (ref 0.1–1.0)
Monocytes Relative: 15 %
NEUTROS PCT: 47 %
Neutro Abs: 1.4 10*3/uL — ABNORMAL LOW (ref 1.7–7.7)
Platelets: 84 10*3/uL — ABNORMAL LOW (ref 150–400)
RBC: 2.64 MIL/uL — ABNORMAL LOW (ref 3.87–5.11)
RDW: 16.5 % — ABNORMAL HIGH (ref 11.5–15.5)
WBC: 3 10*3/uL — ABNORMAL LOW (ref 4.0–10.5)

## 2017-07-09 MED ORDER — MAGNESIUM SULFATE 4 GM/100ML IV SOLN
4.0000 g | Freq: Once | INTRAVENOUS | Status: AC
  Administered 2017-07-09: 4 g via INTRAVENOUS
  Filled 2017-07-09: qty 100

## 2017-07-09 MED ORDER — TBO-FILGRASTIM 480 MCG/0.8ML ~~LOC~~ SOSY
480.0000 ug | PREFILLED_SYRINGE | Freq: Once | SUBCUTANEOUS | Status: AC
  Administered 2017-07-09: 480 ug via SUBCUTANEOUS
  Filled 2017-07-09: qty 0.8

## 2017-07-09 NOTE — Progress Notes (Addendum)
[facility]  PROGRESS NOTE  Ana Wu BJS:[id number] DOB: Sep 28, 1980 DOA: 07/06/2017 PCP: Leighton Ruff, MD   Brief HPI:   36 y.o.femalewith medical history significant ofgrade 3 invasive ductal carcinoma of the right breast, ER+ (20%), PR+ (30%), HER2 negative presenting with fever, she is undergoing chemotherapy treatment under local oncologist Dr. Harden Mo and her last chemo treatment was 5 days before presentation, in the ER she was diagnosed with febrile neutropenia with no clear source and admitted for further treatment.      Subjective   Patient seen and examined, denies any chest pain or shortness of breath.  She had temperature 101 last evening.  WBC 3.0, absolute neutrophil count 300.   Assessment/Plan:     1. Febrile neutropenia-unclear etiology.  Chest x-ray and urine analysis are clear.  Likely viral.  Patient was started on Granix daily on 07/07/2017 and her WBC improved.  As she continues to have fever we will continue 1 more days of Granix.  If she continues to be afebrile in the next 24 hours we will discharge her home on Levaquin for 3 more days. 2. Breast cancer-grade 3 invasive ductal carcinoma of the right breast, ER +20%, PR +30%, H ER 2--follow-up [facility] oncology after discharge. 3. Thrombocytopenia-platelets are still low 84,000, follow CBC in a.m.  Lovenox was discontinued and patient started on SCDs yesterday 4. Hypomagnesemia-replace magnesium, check BMP in a.m.     DVT prophylaxis: SCDs  Code Status: Full code  Family Communication: Discussed with patient's mother at bedside  Disposition Plan: Likely home in 1-2 days   Consultants:  None  Procedures:  None  Continuous infusions . ceFEPime (MAXIPIME) IV Stopped (07/09/17 1428)      Antibiotics:   Anti-infectives (From admission, onward)   Start     Dose/Rate Route Frequency Ordered Stop   07/07/17 0100  ceFEPIme (MAXIPIME) 2 g in dextrose 5 % 50 mL IVPB     2 g 100  mL/hr over 30 Minutes Intravenous Every 8 hours 07/06/17 2218     07/06/17 1815  ceFEPIme (MAXIPIME) 2 g in dextrose 5 % 50 mL IVPB     2 g 100 mL/hr over 30 Minutes Intravenous  Once 07/06/17 1800 07/06/17 2034       Objective   Vitals:   07/08/17 1822 07/08/17 2125 07/09/17 0503 07/09/17 1556  BP:  120/61 (!) 103/48 107/71  Pulse:  (!) 111 74 88  Resp:  18 16 18   Temp: (!) 101.1 F (38.4 C) 99.2 F (37.3 C) 97.9 F (36.6 C) 97.9 F (36.6 C)  TempSrc:  Oral Oral Oral  SpO2:  98% 98% 100%  Weight:      Height:        Intake/Output Summary (Last 24 hours) at 07/09/2017 1656 Last data filed at 07/09/2017 1500 Gross per 24 hour  Intake 1390 ml  Output 4 ml  Net 1386 ml   Filed Weights   07/06/17 2157  Weight: 74 kg (163 lb 2.3 oz)     Physical Examination:   Physical Exam: Eyes: No icterus, extraocular muscles intact  Mouth: Oral mucosa is moist, no lesions on palate,  Neck: Supple, no deformities, masses, or tenderness Lungs: Normal respiratory effort, bilateral clear to auscultation, no crackles or wheezes.  Heart: Regular rate and rhythm, S1 and S2 normal, no murmurs, rubs auscultated Abdomen: BS normoactive,soft,nondistended,non-tender to palpation,no organomegaly Extremities: No pretibial edema, no erythema, no cyanosis, no clubbing Neuro : Alert and oriented to time, place and  person, No focal deficits  Skin: No rashes seen on exam       Data Reviewed: I have personally reviewed following labs and imaging studies  CBG: No results for input(s): GLUCAP in the last 168 hours.  CBC: Recent Labs  Lab 07/06/17 1555 07/07/17 0500 07/08/17 0530 07/09/17 0630  WBC 0.9* 0.5* 2.7* 3.0*  NEUTROABS 0.7*  --  2.2 1.4*  HGB 8.2* 8.0* 8.2* 8.4*  HCT 23.7* 23.7* 23.8* 24.4*  MCV 91.5 91.5 92.6 92.4  PLT 156 124* 98* 84*    Basic Metabolic Panel: Recent Labs  Lab 07/06/17 1555 07/07/17 0500 07/08/17 0530  NA 135 137 138  K 3.8 3.8 3.7  CL 102  107 106  CO2 26 26 26   GLUCOSE 98 96 93  BUN 10 9 7   CREATININE 0.71 0.51 0.56  CALCIUM 9.0 8.6* 8.9  MG  --   --  1.5*    Recent Results (from the past 240 hour(s))  Culture, blood (routine x 2)     Status: None (Preliminary result)   Collection Time: 07/06/17  3:49 PM  Result Value Ref Range Status   Specimen Description BLOOD PORTA CATH  Final   Special Requests   Final    BOTTLES DRAWN AEROBIC AND ANAEROBIC Blood Culture adequate volume   Culture   Final    NO GROWTH 3 DAYS Performed at [facility], [address], [place], [place] [address]    Report Status PENDING  Incomplete  Culture, blood (routine x 2)     Status: None (Preliminary result)   Collection Time: 07/06/17  3:54 PM  Result Value Ref Range Status   Specimen Description BLOOD RIGHT HAND  Final   Special Requests   Final    BOTTLES DRAWN AEROBIC AND ANAEROBIC Blood Culture adequate volume   Culture   Final    NO GROWTH 3 DAYS Performed at [facility], [place] [address], [place],  [address]    Report Status PENDING  Incomplete     Liver Function Tests: Recent Labs  Lab 07/06/17 1555  AST 29  ALT 33  ALKPHOS 33*  BILITOT 0.8  PROT 6.9  ALBUMIN 4.0   No results for input(s): LIPASE, AMYLASE in the last 168 hours. No results for input(s): AMMONIA in the last 168 hours.  Cardiac Enzymes: No results for input(s): CKTOTAL, CKMB, CKMBINDEX, TROPONINI in the last 168 hours. BNP (last 3 results) No results for input(s): BNP in the last 8760 hours.  ProBNP (last 3 results) No results for input(s): PROBNP in the last 8760 hours.    Studies: No results found.  Scheduled Meds: . docusate sodium  100 mg Oral BID      Time spent: 20 min  [facility] Pager [phone]. If 7PM-7AM, please contact night-coverage at [email or web address], Office  [phone]  password [email or web address]  07/09/2017, 4:56 PM  LOS: 3 days

## 2017-07-10 LAB — CBC WITH DIFFERENTIAL/PLATELET
BASOS ABS: 0.6 10*3/uL — AB (ref 0.0–0.1)
Basophils Relative: 7 %
Eosinophils Absolute: 0 10*3/uL (ref 0.0–0.7)
Eosinophils Relative: 0 %
HEMATOCRIT: 25.1 % — AB (ref 36.0–46.0)
Hemoglobin: 8.7 g/dL — ABNORMAL LOW (ref 12.0–15.0)
LYMPHS ABS: 5.2 10*3/uL — AB (ref 0.7–4.0)
Lymphocytes Relative: 60 %
MCH: 32.2 pg (ref 26.0–34.0)
MCHC: 34.7 g/dL (ref 30.0–36.0)
MCV: 93 fL (ref 78.0–100.0)
MONOS PCT: 15 %
Monocytes Absolute: 1.3 10*3/uL — ABNORMAL HIGH (ref 0.1–1.0)
NEUTROS PCT: 18 %
Neutro Abs: 1.6 10*3/uL — ABNORMAL LOW (ref 1.7–7.7)
PLATELETS: 70 10*3/uL — AB (ref 150–400)
RBC: 2.7 MIL/uL — AB (ref 3.87–5.11)
RDW: 16.9 % — AB (ref 11.5–15.5)
WBC: 8.7 10*3/uL (ref 4.0–10.5)

## 2017-07-10 LAB — MAGNESIUM: Magnesium: 1.8 mg/dL (ref 1.7–2.4)

## 2017-07-10 MED ORDER — HEPARIN SOD (PORK) LOCK FLUSH 100 UNIT/ML IV SOLN
500.0000 [IU] | Freq: Once | INTRAVENOUS | Status: DC
  Filled 2017-07-10: qty 5

## 2017-07-10 MED ORDER — LEVOFLOXACIN 500 MG PO TABS: 500.0000 mg | ORAL_TABLET | Freq: Every day | ORAL | 0 refills | Status: AC

## 2017-07-10 NOTE — Discharge Summary (Signed)
Physician Discharge Summary  Ana Wu MBE:[id number] DOB: [birth year] DOA: 07/06/2017  PCP: Leighton Ruff, MD  Admit date: 07/06/2017 Discharge date: 07/10/2017  Time spent: 35* minutes  Recommendations for Outpatient Follow-up:  1. Follow-up oncology as outpatient, patient has an appointment to see [name] oncologist on December 4 2. 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 Harden Wu and her last chemo treatment was 5 days before presentation, in the ER she was diagnosed with febrile neutropenia with no clear source and admitted for further treatment    Hospital Course:  1. Febrile neutropenia-unclear etiology.  Chest x-ray and urine analysis are clear.  Likely viral.  Patient was started on Granix daily on 07/07/2017 and her WBC improved to 8.7.  we will discharge her home on Levaquin for 3 more days. 2. Breast cancer-grade 3 invasive ductal carcinoma of the right breast, ER +20%, PR +30%, H ER 2--follow-up [facility] oncology after discharge. 3. Thrombocytopenia-platelets are still low 70,000, follow CBC in a.m.  Lovenox was discontinued and patient started on SCDs yesterday.  Likely from chemotherapy.  Patient will follow up with oncology on Tuesday. 4. Hypomagnesemia-replaced magnesium is 1.8.     Procedures:  None   Consultations:  None   Discharge Exam: Vitals:   07/09/17 1954  07/10/17 0539  BP: 114/73 126/66  Pulse: 86 88  Resp: 18 18  Temp: 98.6 F (37 C) 99.8 F (37.7 C)  SpO2: 100% 97%    General: appears in no acute distress Cardiovascular: S1-S2, regular Respiratory: Clear to auscultation bilaterally  Discharge Instructions     Allergies  Allergen Reactions  . No Known Allergies       The results of significant diagnostics from this hospitalization (including imaging, microbiology, ancillary and laboratory) are listed below for reference.    Significant Diagnostic Studies: Dg Chest 2 View  Result Date: 07/06/2017 CLINICAL DATA:  36 year old female with fever and body aches since yesterday. Patient is currently being treated for breast cancer. EXAM: CHEST  2 VIEW COMPARISON:  05/14/2017 FINDINGS: Left-sided Port-A-Cath appears in stable position. The cardiomediastinal silhouette is within normal limits. There are no focal parenchymal opacities, pleural effusion or pneumothorax. Visualized osseous structures grossly intact. IMPRESSION: No active cardiopulmonary disease. Electronically Signed   By: [name]   On: 07/06/2017 16:19    Microbiology: Recent Results (from the past 240 hour(s))  Culture, blood (routine x 2)     Status: None (Preliminary result)   Collection Time: 07/06/17  3:49 PM  Result Value Ref Range Status   Specimen Description BLOOD PORTA CATH  Final   Special Requests   Final    BOTTLES DRAWN AEROBIC AND ANAEROBIC Blood Culture adequate volume   Culture   Final    NO GROWTH 3 DAYS Performed at [facility], [address], [place], [place] [address]    Report Status PENDING  Incomplete  Culture, blood (routine x 2)  Status: None (Preliminary result)   Collection Time: 07/06/17  3:54 PM  Result Value Ref Range Status   Specimen Description BLOOD RIGHT HAND  Final   Special Requests   Final    BOTTLES DRAWN AEROBIC AND ANAEROBIC Blood Culture adequate volume   Culture   Final    NO GROWTH 3  DAYS Performed at [facility], [address] [place], [place] [address]    Report Status PENDING  Incomplete     Labs: Basic Metabolic Panel: Recent Labs  Lab 07/06/17 1555 07/07/17 0500 07/08/17 0530 07/10/17 0610  NA 135 137 138  --   K 3.8 3.8 3.7  --   CL 102 107 106  --   CO2 _0 --   GLUCOSE 98 96 93  --   BUN _1 --   CREATININE 0.71 0.51 0.56  --   CALCIUM 9.0 8.6* 8.9  --   MG  --   --  1.5* 1.8   Liver Function Tests: Recent Labs  Lab 07/06/17 1555  AST 29  ALT 33  ALKPHOS 33*  BILITOT 0.8  PROT 6.9  ALBUMIN 4.0   No results for input(s): LIPASE, AMYLASE in the last 168 hours. No results for input(s): AMMONIA in the last 168 hours. CBC: Recent Labs  Lab 07/06/17 1555 07/07/17 0500 07/08/17 0530 07/09/17 0630 07/10/17 0610  WBC 0.9* 0.5* 2.7* 3.0* 8.7  NEUTROABS 0.7*  --  2.2 1.4* 1.6*  HGB 8.2* 8.0* 8.2* 8.4* 8.7*  HCT 23.7* 23.7* 23.8* 24.4* 25.1*  MCV 91.5 91.5 92.6 92.4 93.0  PLT 156 124* 98* 84* 70*       Signed:  [name]  [facility] 07/10/2017, 11:33 AM

## 2017-07-11 LAB — CULTURE, BLOOD (ROUTINE X 2)
CULTURE: NO GROWTH
Culture: NO GROWTH
SPECIAL REQUESTS: ADEQUATE
Special Requests: ADEQUATE

## 2018-01-21 ENCOUNTER — Encounter: Payer: Self-pay | Admitting: Physical Therapy

## 2018-01-28 ENCOUNTER — Ambulatory Visit: Payer: BC Managed Care – PPO | Attending: Internal Medicine | Admitting: Physical Therapy

## 2018-01-28 ENCOUNTER — Encounter: Payer: Self-pay | Admitting: Physical Therapy

## 2018-01-28 DIAGNOSIS — I972 Postmastectomy lymphedema syndrome: Secondary | ICD-10-CM | POA: Diagnosis not present

## 2018-01-28 DIAGNOSIS — R293 Abnormal posture: Secondary | ICD-10-CM | POA: Diagnosis present

## 2018-01-28 DIAGNOSIS — M25611 Stiffness of right shoulder, not elsewhere classified: Secondary | ICD-10-CM

## 2018-01-28 DIAGNOSIS — M6281 Muscle weakness (generalized): Secondary | ICD-10-CM | POA: Diagnosis present

## 2018-01-28 NOTE — Therapy (Signed)
[place], [place], [address] Phone: [phone]   Fax:  [phone]  Physical Therapy Evaluation  Patient Details  Name: Ana Wu MRN: [id number] Date of Birth: 07-11-1981 Referring Provider: Lalla Brothers Deimler NP Deitra Mayo Dr.)   Encounter Date: 01/28/2018  PT End of Session - 01/28/18 1425    Visit Number  1    Number of Visits  13    Date for PT Re-Evaluation  03/04/18    PT Start Time  7482    PT Stop Time  1420    PT Time Calculation (min)  31 min    Activity Tolerance  Patient tolerated treatment well    Behavior During Therapy  [facility] for tasks assessed/performed       Past Medical History:  Diagnosis Date  . Cancer ([place]) 02/2017   Breast Right     Past Surgical History:  Procedure Laterality Date  . BREAST FIBROADENOMA SURGERY     2001, 2007  . PORTACATH PLACEMENT Left 03/27/2017   Procedure: INSERTION PORT-A-CATH;  Surgeon: Stark Klein, MD;  Location: [facility];  Service: General;  Laterality: Left;  . WISDOM TOOTH EXTRACTION      There were no vitals filed for this visit.   Subjective Assessment - 01/28/18 1353    Subjective  I am having swelling under my armpit and I wanted to get it checked out. It has been swelling since probably February. I notice if I do too much my arm feels heavy. I just got measured for sleeves today since I am flying soon. Pt reports that she will be having a left sided mastectomy in the future because she is BRCA 1 positive and then will undergo reconstruction on both.     Pertinent History  Patient was diagnosed on 03/06/17 with right grade 3 invasive ductal carcinoma breast cancer. It measures 2.5 cm and is located in the lower outer quadrant. It is ER/PR positive and HER2 negative with a Ki67 of 85%. She had an axillary lymph node biopsied and it was positive., R mastectomy and ALND (pt reports they took all of them out) on 08/14/17    Patient Stated  Goals  to evaluate the swelling    Currently in Pain?  No/denies         [facility] PT Assessment - 01/28/18 0001      Assessment   Medical Diagnosis  Right breast cancer    Referring Provider  Lalla Brothers Deimler NP Deitra Mayo Dr.    Onset Date/Surgical Date  03/06/17    Hand Dominance  Right    Prior Therapy  none      Precautions   Precautions  Other (comment)    Precaution Comments  at risk for lymphedema      Restrictions   Weight Bearing Restrictions  No      Balance Screen   Has the patient fallen in the past 6 months  No    Has the patient had a decrease in activity level because of a fear of falling?   No    Is the patient reluctant to leave their home because of a fear of falling?   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    Microbiologist at [employer]  She walks 3-4x/week for 45 minutes      Cognition   Overall Cognitive Status  Within Functional Limits for tasks assessed      Posture/Postural Control   Posture/Postural Control  Postural limitations    Postural Limitations  Rounded Shoulders;Forward head      AROM   Overall AROM   Within functional limits for tasks performed    Overall AROM Comments  pt reports increased tightness on the right side    Right Shoulder Extension  --    Right Shoulder Flexion  --    Right Shoulder ABduction  --    Right Shoulder Internal Rotation  --    Right Shoulder External Rotation  --    Left Shoulder Extension  --    Left Shoulder Flexion  --    Left Shoulder ABduction  --    Left Shoulder Internal Rotation  --    Left Shoulder External Rotation  --    Cervical Flexion  --    Cervical Extension  --     Cervical - Right Side Bend  --    Cervical - Left Side Bend  --    Cervical - Right Rotation  --    Cervical - Left Rotation  --      Strength   Overall Strength  Within functional limits for tasks performed        LYMPHEDEMA/ONCOLOGY QUESTIONNAIRE - 01/28/18 1401      Type   Cancer Type  Right breast cancer      Surgeries   Mastectomy Date  09/11/17    Axillary Lymph Node Dissection Date  09/11/17    Number Lymph Nodes Removed  -- pt reports they removed all lymph nodes      Date Lymphedema/Swelling Started   Date  10/09/17      Treatment   Active Chemotherapy Treatment  No    Past Chemotherapy Treatment  Yes    Active Radiation Treatment  No    Past Radiation Treatment  No    Current Hormone Treatment  Yes    Past Hormone Therapy  No      What other symptoms do you have   Are you Having Heaviness or Tightness  Yes    Are you having Pain  No    Are you having pitting edema  No    Is it Hard or Difficult finding clothes that fit  No    Do you have infections  No    Is there Decreased scar mobility  No      Lymphedema Assessments   Lymphedema Assessments  Upper extremities      Right Upper Extremity Lymphedema   10 cm Proximal to Olecranon Process  33 cm    Olecranon Process  27.5 cm    10 cm Proximal to Ulnar Styloid Process  22.5 cm    Just Proximal to Ulnar Styloid Process  16 cm    Across Hand at [employer]  18 cm    At [place] of 2nd Digit  5.7 cm      Left Upper Extremity Lymphedema   10 cm Proximal to Olecranon Process  32.2 cm    Olecranon Process  27.5 cm    10 cm Proximal to Ulnar Styloid Process  21.8 cm  Just Proximal to Ulnar Styloid Process  15.8 cm    Across Hand at [employer]  18 cm    At [place] of 2nd Digit  5.6 cm             Objective measurements completed on examination: See above findings.              PT Education - 01/28/18 1426    Education provided  Yes    Education Details  lymphedema risk reduction  practices, FlexiTouch compression pump, anatomy and physiology of lymphatic system, types of compression garments for R trunk swelling    Person(s) Educated  Patient    Methods  Explanation;Handout    Comprehension  Verbalized understanding          PT Long Term Goals - 01/28/18 1427      PT LONG TERM GOAL #1   Title  Pt will be independent in a home exercise program for continued strengthening and stretching of right shoulder    Time  4    Period  Weeks    Status  New    Target Date  03/04/18      PT LONG TERM GOAL #2   Title  Pt will receive an appropriate compression garment for right lateral trunk lymphedema and a compression sleeve for RUE early stage lymphedema    Time  4    Period  Weeks    Status  New    Target Date  03/04/18      PT LONG TERM GOAL #3   Title  Pt will be independent in self MLD for long term management of right lateral trunk lymphedema    Time  4    Period  Weeks    Status  New    Target Date  03/04/18      Breast Clinic Goals - 03/19/17 1142      Patient will be able to verbalize understanding of pertinent lymphedema risk reduction practices relevant to her diagnosis specifically related to skin care.   Time  1    Period  Days    Status  Achieved      Patient will be able to return demonstrate and/or verbalize understanding of the post-op home exercise program related to regaining shoulder range of motion.   Time  1    Period  Days    Status  Achieved      Patient will be able to verbalize understanding of the importance of attending the postoperative After Breast Cancer Class for further lymphedema risk reduction education and therapeutic exercise.   Time  1    Period  Days    Status  Achieved            Plan - 01/28/18 1422    Clinical Impression Statement  Pt underwent a right mastectomy and ALND on 09/11/17. She began having swelling in her right lateral trunk just inferior to axilla approximately a month after this. This area is  very full especially compared to the left side. She has full ROM of bilateral shoulders but increased tightness at end range on right. She reports when she is more active or lifts or daughter she notices heaviness in her right arm which could be an early stage of lymphedema. She ordered lymphedema sleeves earlier today. She would benefit from skilled PT services to decrease tightness at end range of right shoulder ROM and decrease swelling in right trunk and teach pt how to manage this independently.  History and Personal Factors relevant to plan of care:  right handed, has young daughter thta she lifts    Clinical Presentation  Stable    Clinical Decision Making  Low    Rehab Potential  Excellent    PT Frequency  3x / week    PT Duration  4 weeks    PT Treatment/Interventions  Patient/family education;Therapeutic exercise;ADLs/Self Care Home Management;Manual lymph drainage;Manual techniques;Compression bandaging;Scar mobilization;Passive range of motion;Taping;Vasopneumatic Device    PT Next Visit Plan  begin MLD to right lateral trunk and instruct pt in this, end range stretching for right shoulder, supine scap, see if pt is interested in [place]?    Consulted and Agree with Plan of Care  Patient       Patient will benefit from skilled therapeutic intervention in order to improve the following deficits and impairments:  Postural dysfunction, Decreased knowledge of precautions, Increased fascial restricitons, Increased edema  Visit Diagnosis: Postmastectomy lymphedema  Stiffness of right shoulder, not elsewhere classified  Abnormal posture  Muscle weakness (generalized)     Problem List Patient Active Problem List   Diagnosis Date Noted  . Neutropenic fever ([place]) 07/06/2017  . Normocytic anemia 07/06/2017  . Febrile neutropenia ([place]) 05/14/2017  . Malignant neoplasm of lower-outer quadrant of right breast of female, estrogen receptor  positive ([place]) 03/17/2017  . Postpartum care following vaginal delivery (8/3) 03/15/2016  . Pregnancy 03/14/2016  . Normal labor 03/14/2016  . Rh negative status during pregnancy 03/14/2016    Allyson Sabal [facility] 01/28/2018, 2:29 PM  [place], [place], [address] Phone: [phone]   Fax:  [phone]  Name: EILY LOUVIER MRN: [id number] Date of Birth: 11-26-1980

## 2018-02-02 ENCOUNTER — Ambulatory Visit: Payer: BC Managed Care – PPO | Admitting: Physical Therapy

## 2018-02-02 ENCOUNTER — Encounter: Payer: Self-pay | Admitting: Physical Therapy

## 2018-02-02 DIAGNOSIS — I972 Postmastectomy lymphedema syndrome: Secondary | ICD-10-CM | POA: Diagnosis not present

## 2018-02-02 NOTE — Therapy (Signed)
[place], [place], [address] Phone: [phone]   Fax:  [phone]  Physical Therapy Treatment  Patient Details  Name: Ana Wu MRN: [id number] Date of Birth: Feb 10, 1981 Referring Provider: Lalla Brothers Deimler NP Deitra Mayo Dr.)   Encounter Date: 02/02/2018  PT End of Session - 02/02/18 1206    Visit Number  2    Number of Visits  13    Date for PT Re-Evaluation  03/04/18    PT Start Time  0805    PT Stop Time  0848    PT Time Calculation (min)  43 min    Activity Tolerance  Patient tolerated treatment well    Behavior During Therapy  [facility] for tasks assessed/performed       Past Medical History:  Diagnosis Date  . Cancer ([place]) 02/2017   Breast Right     Past Surgical History:  Procedure Laterality Date  . BREAST FIBROADENOMA SURGERY     2001, 2007  . PORTACATH PLACEMENT Left 03/27/2017   Procedure: INSERTION PORT-A-CATH;  Surgeon: Stark Klein, MD;  Location: [facility];  Service: General;  Laterality: Left;  . WISDOM TOOTH EXTRACTION      There were no vitals filed for this visit.  Subjective Assessment - 02/02/18 0807    Subjective  My swelling is about the same. Sometimes later in the day it will get a little worse.     Pertinent History  Patient was diagnosed on 03/06/17 with right grade 3 invasive ductal carcinoma breast cancer. It measures 2.5 cm and is located in the lower outer quadrant. It is ER/PR positive and HER2 negative with a Ki67 of 85%. She had an axillary lymph node biopsied and it was positive., R mastectomy and ALND (pt reports they took all of them out) on 08/14/17    Patient Stated Goals  to evaluate the swelling    Currently in Pain?  No/denies                       [facility] Adult PT Treatment/Exercise - 02/02/18 0001      Manual Therapy   Manual Therapy  Manual Lymphatic Drainage (MLD);Edema management    Edema Management  created chip pack for pt  to wear in her bra for additional compression    Manual Lymphatic Drainage (MLD)  verbally instructed pt in the following: short neck, superficial and deep abdominals, left axillary nodes and establisment of interaxillary pathway, right inguinal nodes and establishment of right axillo inguinal pathway, right lateral trunk moving fluid towards pathways, then to left sidelying to focus on right lateral trunk moving fluid towards pathways then back to supine retracing all steps                  PT Long Term Goals - 01/28/18 1427      PT LONG TERM GOAL #1   Title  Pt will be independent in a home exercise program for continued strengthening and stretching of right shoulder    Time  4    Period  Weeks    Status  New    Target Date  03/04/18      PT LONG TERM GOAL #2   Title  Pt will receive an appropriate compression garment for right lateral trunk lymphedema and a compression sleeve for RUE early stage lymphedema    Time  4    Period  Weeks    Status  New  Target Date  03/04/18      PT LONG TERM GOAL #3   Title  Pt will be independent in self MLD for long term management of right lateral trunk lymphedema    Time  4    Period  Weeks    Status  New    Target Date  03/04/18      Breast Clinic Goals - 03/19/17 1142      Patient will be able to verbalize understanding of pertinent lymphedema risk reduction practices relevant to her diagnosis specifically related to skin care.   Time  1    Period  Days    Status  Achieved      Patient will be able to return demonstrate and/or verbalize understanding of the post-op home exercise program related to regaining shoulder range of motion.   Time  1    Period  Days    Status  Achieved      Patient will be able to verbalize understanding of the importance of attending the postoperative After Breast Cancer Class for further lymphedema risk reduction education and therapeutic exercise.   Time  1    Period  Days    Status  Achieved            Plan - 02/02/18 1207    Clinical Impression Statement  Began MLD to right lateral trunk today while verbally instructing pt throughout. Created chip pack for pt to wear in bra for additional compression to this area.     Rehab Potential  Excellent    Clinical Impairments Affecting Rehab Potential  None    PT Duration  4 weeks    PT Treatment/Interventions  Patient/family education;Therapeutic exercise;ADLs/Self Care Home Management;Manual lymph drainage;Manual techniques;Compression bandaging;Scar mobilization;Passive range of motion;Taping;Vasopneumatic Device    PT Next Visit Plan  see how chip pack worked, print out instructions, cont MLD to R lat trunk, end range stretching R sh, supine scap    Consulted and Agree with Plan of Care  Patient       Patient will benefit from skilled therapeutic intervention in order to improve the following deficits and impairments:  Postural dysfunction, Decreased knowledge of precautions, Increased fascial restricitons, Increased edema  Visit Diagnosis: Postmastectomy lymphedema     Problem List Patient Active Problem List   Diagnosis Date Noted  . Neutropenic fever ([place]) 07/06/2017  . Normocytic anemia 07/06/2017  . Febrile neutropenia ([place]) 05/14/2017  . Malignant neoplasm of lower-outer quadrant of right breast of female, estrogen receptor positive ([place]) 03/17/2017  . Postpartum care following vaginal delivery (8/3) 03/15/2016  . Pregnancy 03/14/2016  . Normal labor 03/14/2016  . Rh negative status during pregnancy 03/14/2016    Allyson Sabal [facility] 02/02/2018, 12:09 PM  [place], [place], [address] Phone: [phone]   Fax:  [phone]  Name: ELIZABETH PAULSEN MRN: [id number] Date of Birth: January 23, 1981  Manus Gunning, PT 02/02/18 12:09 PM

## 2018-02-04 ENCOUNTER — Ambulatory Visit: Payer: BC Managed Care – PPO | Admitting: Physical Therapy

## 2018-02-04 DIAGNOSIS — I972 Postmastectomy lymphedema syndrome: Secondary | ICD-10-CM | POA: Diagnosis not present

## 2018-02-04 DIAGNOSIS — M25611 Stiffness of right shoulder, not elsewhere classified: Secondary | ICD-10-CM

## 2018-02-04 NOTE — Patient Instructions (Signed)
Self manual lymph drainage: Perform this sequence once a day.  Only give enough pressure no your skin to make the skin move.  Diaphragmatic - Supine   Inhale through nose making navel move out toward hands. Exhale through puckered lips, hands follow navel in. Repeat _5__ times. Rest _2__ seconds between repeats.   Copyright  VHI. All rights reserved.  "Hug yourself" by crossing arms and placing your hands beside your neck and behind your collarbones.  Do circles at your neck just above your collarbones.  Repeat this 7 times.  Axilla - One at a Time   Using full weight of flat hand and fingers at center of uninvolved armpit, make _7__ in-place circles.   Copyright  VHI. All rights reserved.  LEG: Inguinal Nodes Stimulation   With small finger side of hand against hip crease on involved side, gently perform circles at the crease. Repeat __7_ times.   Copyright  VHI. All rights reserved.  1) Axilla to Inguinal Nodes - pump   On involved side, pump _4__ times from armpit along side of trunk to hip crease.  Now gently stretch skin from the involved side to the uninvolved side across the chest at the shoulder line.  Repeat that 4 times.  Next, turn onto your left side and work on pumping down from the swollen area at your right upper flank and back areas. Gently pump the fluid downward a couple of strokes for several times in this area. Then pump all the way down from right upper flank to right groin.  Turn back onto your back and finish by doing the pathways as described above going from your involved armpit to the same side groin and going across your upper chest from the involved shoulder to the uninvolved shoulder.  Repeat the steps above where you do circles in your right groin and left armpit. Copyright  VHI. All rights reserved.

## 2018-02-04 NOTE — Therapy (Signed)
[place], [place], [address] Phone: [phone]   Fax:  [phone]  Physical Therapy Treatment  Patient Details  Name: Ana Wu MRN: [id number] Date of Birth: 01/28/1981 Referring Provider: Lalla Brothers Deimler NP Deitra Mayo Dr.)   Encounter Date: 02/04/2018  PT End of Session - 02/04/18 1255    Visit Number  3    Number of Visits  13    Date for PT Re-Evaluation  03/04/18    PT Start Time  0807    PT Stop Time  0846    PT Time Calculation (min)  39 min    Activity Tolerance  Patient tolerated treatment well    Behavior During Therapy  [facility] for tasks assessed/performed       Past Medical History:  Diagnosis Date  . Cancer ([place]) 02/2017   Breast Right     Past Surgical History:  Procedure Laterality Date  . BREAST FIBROADENOMA SURGERY     2001, 2007  . PORTACATH PLACEMENT Left 03/27/2017   Procedure: INSERTION PORT-A-CATH;  Surgeon: Stark Klein, MD;  Location: [facility];  Service: General;  Laterality: Left;  . WISDOM TOOTH EXTRACTION      There were no vitals filed for this visit.  Subjective Assessment - 02/04/18 0808    Subjective  Things are pretty good. Tried the chip pack, wearing it for most of the day Monday, but then it felt annoying so she took it out. It did feel supportive there.     Pertinent History  Patient was diagnosed on 03/06/17 with right grade 3 invasive ductal carcinoma breast cancer. It measures 2.5 cm and is located in the lower outer quadrant. It is ER/PR positive and HER2 negative with a Ki67 of 85%. She had an axillary lymph node biopsied and it was positive., R mastectomy and ALND (pt reports they took all of them out) on 08/14/17    Patient Stated Goals  to evaluate the swelling    Currently in Pain?  No/denies                       [facility] Adult PT Treatment/Exercise - 02/04/18 0001      Manual Therapy   Manual Therapy  Passive ROM    Manual Lymphatic Drainage (MLD)  verbally instructed and had pt perform the following: short neck, superficial and deep abdominals, left axillary nodes and establisment of interaxillary pathway, right inguinal nodes and establishment of right axillo inguinal pathway, right lateral trunk moving fluid towards pathways, then to left sidelying to focus on right lateral trunk moving fluid towards pathways then back to supine retracing all steps    Passive ROM  In supine, to right shoulder into er, abduction and flexion; also horizontal abduction with stretch to patient tolerance.             PT Education - 02/04/18 1253    Education provided  Yes    Education Details  self-manual lymph drainage    Person(s) Educated  Patient    Methods  Explanation;Demonstration;Tactile cues;Verbal cues;Handout    Comprehension  Verbalized understanding;Returned demonstration          PT Long Term Goals - 01/28/18 1427      PT LONG TERM GOAL #1   Title  Pt will be independent in a home exercise program for continued strengthening and stretching of right shoulder    Time  4    Period  Weeks  Status  New    Target Date  03/04/18      PT LONG TERM GOAL #2   Title  Pt will receive an appropriate compression garment for right lateral trunk lymphedema and a compression sleeve for RUE early stage lymphedema    Time  4    Period  Weeks    Status  New    Target Date  03/04/18      PT LONG TERM GOAL #3   Title  Pt will be independent in self MLD for long term management of right lateral trunk lymphedema    Time  4    Period  Weeks    Status  New    Target Date  03/04/18      Breast Clinic Goals - 03/19/17 1142      Patient will be able to verbalize understanding of pertinent lymphedema risk reduction practices relevant to her diagnosis specifically related to skin care.   Time  1    Period  Days    Status  Achieved      Patient will be able to return demonstrate and/or verbalize understanding  of the post-op home exercise program related to regaining shoulder range of motion.   Time  1    Period  Days    Status  Achieved      Patient will be able to verbalize understanding of the importance of attending the postoperative After Breast Cancer Class for further lymphedema risk reduction education and therapeutic exercise.   Time  1    Period  Days    Status  Achieved           Plan - 02/04/18 1255    Clinical Impression Statement  Pt. did well with learning manual lymph drainage for right lateral trunk today. Also did well with brief right shoulder PROM.    Rehab Potential  Excellent    Clinical Impairments Affecting Rehab Potential  None    PT Frequency  3x / week    PT Duration  4 weeks    PT Treatment/Interventions  Patient/family education;Therapeutic exercise;ADLs/Self Care Home Management;Manual lymph drainage;Manual techniques;Compression bandaging;Scar mobilization;Passive range of motion;Taping;Vasopneumatic Device    PT Next Visit Plan  Manual lymph drainage to right lateral trunk, right shoulder ROM, supine scapular exercises    PT Home Exercise Plan  Post op shoulder ROM HEP    Consulted and Agree with Plan of Care  Patient       Patient will benefit from skilled therapeutic intervention in order to improve the following deficits and impairments:  Postural dysfunction, Decreased knowledge of precautions, Increased fascial restricitons, Increased edema  Visit Diagnosis: Postmastectomy lymphedema  Stiffness of right shoulder, not elsewhere classified     Problem List Patient Active Problem List   Diagnosis Date Noted  . Neutropenic fever ([place]) 07/06/2017  . Normocytic anemia 07/06/2017  . Febrile neutropenia ([place]) 05/14/2017  . Malignant neoplasm of lower-outer quadrant of right breast of female, estrogen receptor positive ([place]) 03/17/2017  . Postpartum care following vaginal delivery (8/3) 03/15/2016  . Pregnancy 03/14/2016  . Normal labor 03/14/2016   . Rh negative status during pregnancy 03/14/2016    SALISBURY,DONNA 02/04/2018, 12:57 PM  [place], [place], [address] Phone: [phone]   Fax:  [phone]  Name: Ana Wu MRN: [id number] Date of Birth: 1980-10-13  Serafina Royals, PT 02/04/18 12:57 PM

## 2018-02-05 ENCOUNTER — Ambulatory Visit: Payer: BC Managed Care – PPO | Admitting: Physical Therapy

## 2018-02-05 DIAGNOSIS — R293 Abnormal posture: Secondary | ICD-10-CM

## 2018-02-05 DIAGNOSIS — I972 Postmastectomy lymphedema syndrome: Secondary | ICD-10-CM

## 2018-02-05 DIAGNOSIS — M25611 Stiffness of right shoulder, not elsewhere classified: Secondary | ICD-10-CM

## 2018-02-05 NOTE — Therapy (Signed)
[place], [place], [address] Phone: [phone]   Fax:  [phone]  Physical Therapy Treatment  Patient Details  Name: Ana Wu MRN: [id number] Date of Birth: June 24, 1981 Referring Provider: Lalla Brothers Deimler NP Deitra Mayo Dr.)   Encounter Date: 02/05/2018  PT End of Session - 02/05/18 1734    Visit Number  4    Number of Visits  13    Date for PT Re-Evaluation  03/04/18    PT Start Time  3845    PT Stop Time  1516    PT Time Calculation (min)  42 min    Activity Tolerance  Patient tolerated treatment well    Behavior During Therapy  [facility] for tasks assessed/performed       Past Medical History:  Diagnosis Date  . Cancer ([place]) 02/2017   Breast Right     Past Surgical History:  Procedure Laterality Date  . BREAST FIBROADENOMA SURGERY     2001, 2007  . PORTACATH PLACEMENT Left 03/27/2017   Procedure: INSERTION PORT-A-CATH;  Surgeon: Stark Klein, MD;  Location: [facility];  Service: General;  Laterality: Left;  . WISDOM TOOTH EXTRACTION      There were no vitals filed for this visit.  Subjective Assessment - 02/05/18 1435    Subjective  Nothing new, about the same. I wanted to tell you the shoulder stretches were so helpful.    Pertinent History  Patient was diagnosed on 03/06/17 with right grade 3 invasive ductal carcinoma breast cancer. It measures 2.5 cm and is located in the lower outer quadrant. It is ER/PR positive and HER2 negative with a Ki67 of 85%. She had an axillary lymph node biopsied and it was positive., R mastectomy and ALND (pt reports they took all of them out) on 08/14/17    Patient Stated Goals  to evaluate the swelling    Currently in Pain?  No/denies         [facility] PT Assessment - 02/05/18 0001      AROM   Right Shoulder Flexion  158 Degrees    Right Shoulder ABduction  177 Degrees    Right Shoulder External Rotation  90 Degrees                    [facility] Adult PT Treatment/Exercise - 02/05/18 0001      Shoulder Exercises: Standing   Flexion  AAROM;Right with dowel, 10 counts x 3 reps    ABduction  Right;AAROM with dowel, 10 counts x 3 reps     Other Standing Exercises  tried sidebend to left with right arm abducted    Other Standing Exercises  doorway stretch, 10 counts x 3      Shoulder Exercises: Stretch   Other Shoulder Stretches  supine with arms outstretched, move right leg across left for lower trunk rotation and chest stretch      Manual Therapy   Manual Lymphatic Drainage (MLD)  short neck, superficial and deep abdominals, left axillary nodes and establisment of interaxillary pathway, right inguinal nodes and establishment of right axillo inguinal pathway, right lateral trunk moving fluid towards pathways, then to left sidelying to focus on right lateral trunk     Passive ROM  In supine, to right shoulder into er, abduction and flexion; also horizontal abduction with stretch to patient tolerance.             PT Education - 02/05/18 1733    Education provided  Yes    Education Details  dowel stretches for right shoulder flexion and abduction; doorway stretches; supine lower trunk rotation stretches with arms outstretched    Person(s) Educated  Patient    Methods  Explanation;Verbal cues;Handout    Comprehension  Verbalized understanding;Returned demonstration          PT Long Term Goals - 01/28/18 1427      PT LONG TERM GOAL #1   Title  Pt will be independent in a home exercise program for continued strengthening and stretching of right shoulder    Time  4    Period  Weeks    Status  New    Target Date  03/04/18      PT LONG TERM GOAL #2   Title  Pt will receive an appropriate compression garment for right lateral trunk lymphedema and a compression sleeve for RUE early stage lymphedema    Time  4    Period  Weeks    Status  New    Target Date  03/04/18      PT LONG TERM GOAL  #3   Title  Pt will be independent in self MLD for long term management of right lateral trunk lymphedema    Time  4    Period  Weeks    Status  New    Target Date  03/04/18      Breast Clinic Goals - 03/19/17 1142      Patient will be able to verbalize understanding of pertinent lymphedema risk reduction practices relevant to her diagnosis specifically related to skin care.   Time  1    Period  Days    Status  Achieved      Patient will be able to return demonstrate and/or verbalize understanding of the post-op home exercise program related to regaining shoulder range of motion.   Time  1    Period  Days    Status  Achieved      Patient will be able to verbalize understanding of the importance of attending the postoperative After Breast Cancer Class for further lymphedema risk reduction education and therapeutic exercise.   Time  1    Period  Days    Status  Achieved           Plan - 02/05/18 1734    Clinical Impression Statement  Pt. reported feeling good after shoulder stretches yesterday and wanted to learn some to do at home, so this was taught today. Manual lymph drainage was also done, in part by way of review. Further PROM also.    Rehab Potential  Excellent    Clinical Impairments Affecting Rehab Potential  None    PT Frequency  3x / week    PT Duration  4 weeks    PT Treatment/Interventions  Patient/family education;Therapeutic exercise;ADLs/Self Care Home Management;Manual lymph drainage;Manual techniques;Compression bandaging;Scar mobilization;Passive range of motion;Taping;Vasopneumatic Device    PT Next Visit Plan  Manual lymph drainage to right lateral trunk, right shoulder ROM, supine scapular exercises    PT Home Exercise Plan  Post op shoulder ROM HEP, dowel and doorway stretches, lower trunk rotation with arms outstretched    Consulted and Agree with Plan of Care  Patient       Patient will benefit from skilled therapeutic intervention in order to  improve the following deficits and impairments:  Postural dysfunction, Decreased knowledge of precautions, Increased fascial restricitons, Increased edema  Visit Diagnosis: Postmastectomy lymphedema  Stiffness of right shoulder, not elsewhere  classified  Abnormal posture     Problem List Patient Active Problem List   Diagnosis Date Noted  . Neutropenic fever ([place]) 07/06/2017  . Normocytic anemia 07/06/2017  . Febrile neutropenia ([place]) 05/14/2017  . Malignant neoplasm of lower-outer quadrant of right breast of female, estrogen receptor positive ([place]) 03/17/2017  . Postpartum care following vaginal delivery (8/3) 03/15/2016  . Pregnancy 03/14/2016  . Normal labor 03/14/2016  . Rh negative status during pregnancy 03/14/2016    SALISBURY,DONNA 02/05/2018, 5:37 PM  [place], [place], [address] Phone: [phone]   Fax:  [phone]  Name: Ana Wu MRN: [id number] Date of Birth: 01-11-1981  Serafina Royals, PT 02/05/18 5:37 PM

## 2018-02-05 NOTE — Patient Instructions (Signed)
Cane Exercise: Abduction    Hold cane with right hand over end, palm-up, with other hand palm-down. Move arm out from side and up by pushing with other arm. Hold _10___ seconds. Repeat __3-5__ times. Do __1-2__ sessions per day.  http://gt2.exer.us/82   Copyright  VHI. All rights reserved.    CHEST: Doorway, Bilateral - Standing    Standing in doorway, place hands on wall with elbows bent at shoulder height. Lunge forward, rather than lean like in the picture, with front knee bent. You should feel a stretch at chest and shoulders. Hold _10__ seconds. _3-5__ reps per set, _1-2__ sets per day, __7_ days per week  Copyright  VHI. All rights reserved.  Flexion (Eccentric) - Active-Assist (Cane)     Use unaffected arm to push affected arm forward. Avoid hiking shoulder. Keep palm relaxed. Hold for 10 seconds where you feel a gentle stretch. _3-5__ reps per set, _1-2__ sets per day, _7__ days per week.   http://ecce.exer.us/153   Copyright  VHI. All rights reserved.  Lower Trunk Rotation    Rather than as in picture, keep your left leg straight and resting on the bed. Lift your right leg straight up toward the ceiling and swing it over your left leg so your trunk rotates and you reach a point where you feel a stretch at your right chest. Hold for 30 seconds, 1-2 times a day. http://orth.exer.us/151   Copyright  VHI. All rights reserved.

## 2018-02-10 ENCOUNTER — Ambulatory Visit: Payer: BC Managed Care – PPO | Attending: Internal Medicine

## 2018-02-10 DIAGNOSIS — C50511 Malignant neoplasm of lower-outer quadrant of right female breast: Secondary | ICD-10-CM | POA: Insufficient documentation

## 2018-02-10 DIAGNOSIS — Z170 Estrogen receptor positive status [ER+]: Secondary | ICD-10-CM | POA: Diagnosis present

## 2018-02-10 DIAGNOSIS — M25611 Stiffness of right shoulder, not elsewhere classified: Secondary | ICD-10-CM | POA: Diagnosis present

## 2018-02-10 DIAGNOSIS — I972 Postmastectomy lymphedema syndrome: Secondary | ICD-10-CM | POA: Diagnosis not present

## 2018-02-10 DIAGNOSIS — M6281 Muscle weakness (generalized): Secondary | ICD-10-CM

## 2018-02-10 DIAGNOSIS — R293 Abnormal posture: Secondary | ICD-10-CM | POA: Diagnosis present

## 2018-02-10 NOTE — Patient Instructions (Signed)

## 2018-02-10 NOTE — Therapy (Signed)
[place], [place], [address] Phone: [phone]   Fax:  [phone]  Physical Therapy Treatment  Patient Details  Name: Ana Wu MRN: [id number] Date of Birth: 1981-01-06 Referring Provider: Lalla Brothers Deimler NP Deitra Mayo Dr.)   Encounter Date: 02/10/2018  PT End of Session - 02/10/18 0847    Visit Number  5    Number of Visits  13    Date for PT Re-Evaluation  03/04/18    PT Start Time  0815 Pt arrived late    PT Stop Time  0846    PT Time Calculation (min)  31 min    Activity Tolerance  Patient tolerated treatment well    Behavior During Therapy  [facility] for tasks assessed/performed       Past Medical History:  Diagnosis Date  . Cancer ([place]) 02/2017   Breast Right     Past Surgical History:  Procedure Laterality Date  . BREAST FIBROADENOMA SURGERY     2001, 2007  . PORTACATH PLACEMENT Left 03/27/2017   Procedure: INSERTION PORT-A-CATH;  Surgeon: Stark Klein, MD;  Location: [facility];  Service: General;  Laterality: Left;  . WISDOM TOOTH EXTRACTION      There were no vitals filed for this visit.  Subjective Assessment - 02/10/18 0814    Subjective  I've been doing the stretches and those are really helping my Rt shoulder. I was playing around with my 2 y/o daughter yesterday and I think I tweaked my neck playing with her, it just feels sore. I can't tell much difference with the massage and my swelling. But it hasn't gotten any worse so I guess that's something.     Pertinent History  Patient was diagnosed on 03/06/17 with right grade 3 invasive ductal carcinoma breast cancer. It measures 2.5 cm and is located in the lower outer quadrant. It is ER/PR positive and HER2 negative with a Ki67 of 85%. She had an axillary lymph node biopsied and it was positive., R mastectomy and ALND (pt reports they took all of them out) on 08/14/17    Patient Stated Goals  to evaluate the swelling    Currently in Pain?  No/denies                       [facility] Adult PT Treatment/Exercise - 02/10/18 0001      Shoulder Exercises: Supine   Horizontal ABduction  Strengthening;Both;10 reps;Theraband Pt returned therapist demo for all following supine exs    Theraband Level (Shoulder Horizontal ABduction)  Level 2 (Red)    External Rotation  Strengthening;Both;10 reps;Right;Theraband    Theraband Level (Shoulder External Rotation)  Level 2 (Red)    Flexion  Strengthening;Both;10 reps;Theraband Narrow and Wide Grip, 10 times    Theraband Level (Shoulder Flexion)  Level 2 (Red)    Diagonals  Strengthening;Right;Left;10 reps;Theraband    Theraband Level (Shoulder Diagonals)  Level 2 (Red)      Manual Therapy   Manual Therapy  Myofascial release;Passive ROM    Myofascial Release  To Rt axilla and chest wall during P/ROM     Passive ROM  In supine, to right shoulder into er, abduction, flexion, and D2             PT Education - 02/10/18 0819    Education provided  Yes    Education Details  Supine scapular series with red theraband    Person(s) Educated  Patient  Methods  Explanation;Demonstration;Handout    Comprehension  Verbalized understanding;Returned demonstration          PT Long Term Goals - 01/28/18 1427      PT LONG TERM GOAL #1   Title  Pt will be independent in a home exercise program for continued strengthening and stretching of right shoulder    Time  4    Period  Weeks    Status  New    Target Date  03/04/18      PT LONG TERM GOAL #2   Title  Pt will receive an appropriate compression garment for right lateral trunk lymphedema and a compression sleeve for RUE early stage lymphedema    Time  4    Period  Weeks    Status  New    Target Date  03/04/18      PT LONG TERM GOAL #3   Title  Pt will be independent in self MLD for long term management of right lateral trunk lymphedema    Time  4    Period  Weeks    Status  New    Target Date   03/04/18      Breast Clinic Goals - 03/19/17 1142      Patient will be able to verbalize understanding of pertinent lymphedema risk reduction practices relevant to her diagnosis specifically related to skin care.   Time  1    Period  Days    Status  Achieved      Patient will be able to return demonstrate and/or verbalize understanding of the post-op home exercise program related to regaining shoulder range of motion.   Time  1    Period  Days    Status  Achieved      Patient will be able to verbalize understanding of the importance of attending the postoperative After Breast Cancer Class for further lymphedema risk reduction education and therapeutic exercise.   Time  1    Period  Days    Status  Achieved           Plan - 02/10/18 0847    Clinical Impression Statement  Pt had reported not noticing the manual lymph drainage making much difference iwth her swelling so focused today instead on progressing her HEP and then working on end ROM stretching. Pt reported feeling good after session and overall pleased with how much better her shoulder stiffness has improved.     Rehab Potential  Excellent    Clinical Impairments Affecting Rehab Potential  None    PT Frequency  3x / week    PT Duration  4 weeks    PT Treatment/Interventions  Patient/family education;Therapeutic exercise;ADLs/Self Care Home Management;Manual lymph drainage;Manual techniques;Compression bandaging;Scar mobilization;Passive range of motion;Taping;Vasopneumatic Device    PT Next Visit Plan  Manual lymph drainage to right lateral trunk prn (though pt is doing this consistently at home); talk about a compression bra?? right shoulder ROM, review supine scapular series; after todays visit it's possible pt won't need all visits    Consulted and Agree with Plan of Care  Patient       Patient will benefit from skilled therapeutic intervention in order to improve the following deficits and impairments:  Postural  dysfunction, Decreased knowledge of precautions, Increased fascial restricitons, Increased edema  Visit Diagnosis: Postmastectomy lymphedema  Stiffness of right shoulder, not elsewhere classified  Abnormal posture  Muscle weakness (generalized)     Problem List Patient Active Problem List   Diagnosis Date  Noted  . Neutropenic fever ([place]) 07/06/2017  . Normocytic anemia 07/06/2017  . Febrile neutropenia ([place]) 05/14/2017  . Malignant neoplasm of lower-outer quadrant of right breast of female, estrogen receptor positive ([place]) 03/17/2017  . Postpartum care following vaginal delivery (8/3) 03/15/2016  . Pregnancy 03/14/2016  . Normal labor 03/14/2016  . Rh negative status during pregnancy 03/14/2016    Otelia Limes, PTA 02/10/2018, 8:53 AM  [place], [place], [address] Phone: [phone]   Fax:  [phone]  Name: Ana Wu MRN: [id number] Date of Birth: 02/22/1981

## 2018-02-11 ENCOUNTER — Ambulatory Visit: Payer: BC Managed Care – PPO

## 2018-02-11 DIAGNOSIS — I972 Postmastectomy lymphedema syndrome: Secondary | ICD-10-CM

## 2018-02-11 DIAGNOSIS — M6281 Muscle weakness (generalized): Secondary | ICD-10-CM

## 2018-02-11 DIAGNOSIS — R293 Abnormal posture: Secondary | ICD-10-CM

## 2018-02-11 DIAGNOSIS — M25611 Stiffness of right shoulder, not elsewhere classified: Secondary | ICD-10-CM

## 2018-02-11 NOTE — Therapy (Signed)
[place], [place], [address] Phone: [phone]   Fax:  [phone]  Physical Therapy Treatment  Patient Details  Name: Ana Wu MRN: [id number] Date of Birth: [birth year] Referring Provider: Lalla Brothers Deimler NP Deitra Mayo Dr.)   Encounter Date: 02/11/2018  PT End of Session - 02/11/18 0850    Visit Number  6    Number of Visits  13    Date for PT Re-Evaluation  03/04/18    PT Start Time  0809    PT Stop Time  0848    PT Time Calculation (min)  39 min    Activity Tolerance  Patient tolerated treatment well    Behavior During Therapy  [facility] for tasks assessed/performed       Past Medical History:  Diagnosis Date  . Cancer ([place]) 02/2017   Breast Right     Past Surgical History:  Procedure Laterality Date  . BREAST FIBROADENOMA SURGERY     2001, 2007  . PORTACATH PLACEMENT Left 03/27/2017   Procedure: INSERTION PORT-A-CATH;  Surgeon: Stark Klein, MD;  Location: [facility];  Service: General;  Laterality: Left;  . WISDOM TOOTH EXTRACTION      There were no vitals filed for this visit.  Subjective Assessment - 02/11/18 0810    Subjective  Nothing new since I was here yesterday. The new exercises were fine, no complaints.     Pertinent History  Patient was diagnosed on 03/06/17 with right grade 3 invasive ductal carcinoma breast cancer. It measures 2.5 cm and is located in the lower outer quadrant. It is ER/PR positive and HER2 negative with a Ki67 of 85%. She had an axillary lymph node biopsied and it was positive., R mastectomy and ALND (pt reports they took all of them out) on 08/14/17    Patient Stated Goals  to evaluate the swelling    Currently in Pain?  No/denies                       [facility] Adult PT Treatment/Exercise - 02/11/18 0001      Self-Care   Self-Care  Other Self-Care Comments    Other Self-Care Comments   Instructed pt today in lymphedema risk reduction an  dinfection prevention answering her questions throughout      Shoulder Exercises: Standing   Other Standing Exercises  3 way raises with back against wall/core engaged, 1 lb and 15x each, pt returned excellent demonstration      Shoulder Exercises: Pulleys   Flexion  2 minutes    Flexion Limitations  Pt returned therapist demonstration    ABduction  2 minutes    ABduction Limitations  Pt retunred therapist demonstration      Shoulder Exercises: Therapy Ball   Flexion  Both;10 reps With forward lean into end of stretch    ABduction  Right;10 reps Same side lean into end of stretch      Manual Therapy   Passive ROM  In supine, to right shoulder into er, abduction, flexion, and D2             PT Education - 02/10/18 0819    Education provided  Yes    Education Details  Supine scapular series with red theraband    Person(s) Educated  Patient    Methods  Explanation;Demonstration;Handout    Comprehension  Verbalized understanding;Returned demonstration          PT Long Term Goals - 02/11/18  0813      PT LONG TERM GOAL #1   Title  Pt will be independent in a home exercise program for continued strengthening and stretching of right shoulder    Status  On-going      PT LONG TERM GOAL #2   Title  Pt will receive an appropriate compression garment for right lateral trunk lymphedema and a compression sleeve for RUE early stage lymphedema    Baseline  Pt has 2 compression sleeves and has ordered sports bras from [employer], is going ot look into a compression bra as well -02/11/18    Status  Partially Met      PT LONG TERM GOAL #3   Title  Pt will be independent in self MLD for long term management of right lateral trunk lymphedema    Status  Achieved      Breast Clinic Goals - 03/19/17 1142      Patient will be able to verbalize understanding of pertinent lymphedema risk reduction practices relevant to her diagnosis specifically related to skin care.   Time  1     Period  Days    Status  Achieved      Patient will be able to return demonstrate and/or verbalize understanding of the post-op home exercise program related to regaining shoulder range of motion.   Time  1    Period  Days    Status  Achieved      Patient will be able to verbalize understanding of the importance of attending the postoperative After Breast Cancer Class for further lymphedema risk reduction education and therapeutic exercise.   Time  1    Period  Days    Status  Achieved           Plan - 02/11/18 0851    Clinical Impression Statement  Assessed progress towards goals and pt is progressing very well overall. She can tell improvement with her ROM though still reports fatigue in Rt shoulder by end of day so this week have begun light strengthening which she tolerated very well. Pt will benefit from continued therapy to focus on HEP for strengthening and end ROM.    Rehab Potential  Excellent    Clinical Impairments Affecting Rehab Potential  None    PT Frequency  3x / week    PT Duration  4 weeks    PT Treatment/Interventions  Patient/family education;Therapeutic exercise;ADLs/Self Care Home Management;Manual lymph drainage;Manual techniques;Compression bandaging;Scar mobilization;Passive range of motion;Taping;Vasopneumatic Device    PT Next Visit Plan  Measure ROM. Strengthening for Rt shoulder and progressing HEP for this (add 3 way raises to HEP next visit); Rt shoulder ROM    Consulted and Agree with Plan of Care  Patient       Patient will benefit from skilled therapeutic intervention in order to improve the following deficits and impairments:  Postural dysfunction, Decreased knowledge of precautions, Increased fascial restricitons, Increased edema  Visit Diagnosis: Postmastectomy lymphedema  Stiffness of right shoulder, not elsewhere classified  Abnormal posture  Muscle weakness (generalized)     Problem List Patient Active Problem List   Diagnosis Date  Noted  . Neutropenic fever ([place]) 07/06/2017  . Normocytic anemia 07/06/2017  . Febrile neutropenia ([place]) 05/14/2017  . Malignant neoplasm of lower-outer quadrant of right breast of female, estrogen receptor positive ([place]) 03/17/2017  . Postpartum care following vaginal delivery (8/3) 03/15/2016  . Pregnancy 03/14/2016  . Normal labor 03/14/2016  . Rh negative status during pregnancy 03/14/2016  Otelia Limes, PTA 02/11/2018, 9:00 AM  [place], [place], [address] Phone: [phone]   Fax:  [phone]  Name: Ana Wu MRN: [id number] Date of Birth: 06-Aug-1981

## 2018-02-13 ENCOUNTER — Ambulatory Visit: Payer: BC Managed Care – PPO | Admitting: Physical Therapy

## 2018-02-13 ENCOUNTER — Encounter: Payer: Self-pay | Admitting: Physical Therapy

## 2018-02-13 DIAGNOSIS — I972 Postmastectomy lymphedema syndrome: Secondary | ICD-10-CM

## 2018-02-13 DIAGNOSIS — M25611 Stiffness of right shoulder, not elsewhere classified: Secondary | ICD-10-CM

## 2018-02-13 DIAGNOSIS — M6281 Muscle weakness (generalized): Secondary | ICD-10-CM

## 2018-02-13 DIAGNOSIS — R293 Abnormal posture: Secondary | ICD-10-CM

## 2018-02-13 NOTE — Patient Instructions (Signed)

## 2018-02-13 NOTE — Therapy (Signed)
[place], [place], [address] Phone: [phone]   Fax:  [phone]  Physical Therapy Treatment  Patient Details  Name: Ana Wu MRN: [id number] Date of Birth: 02/03/1981 Referring Provider: Lalla Brothers Deimler NP Deitra Mayo Dr.)   Encounter Date: 02/13/2018  PT End of Session - 02/13/18 0844    Visit Number  7    Number of Visits  13    Date for PT Re-Evaluation  03/04/18    PT Start Time  0809 pt arrived late    PT Stop Time  0844    PT Time Calculation (min)  35 min    Activity Tolerance  Patient tolerated treatment well    Behavior During Therapy  [facility] for tasks assessed/performed       Past Medical History:  Diagnosis Date  . Cancer ([place]) 02/2017   Breast Right     Past Surgical History:  Procedure Laterality Date  . BREAST FIBROADENOMA SURGERY     2001, 2007  . PORTACATH PLACEMENT Left 03/27/2017   Procedure: INSERTION PORT-A-CATH;  Surgeon: Stark Klein, MD;  Location: [place];  Service: General;  Laterality: Left;  . WISDOM TOOTH EXTRACTION      There were no vitals filed for this visit.  Subjective Assessment - 02/13/18 0810    Subjective  The band exercises are going well. My shoulder is feeling pretty good. I think I did too much yesterday because my upper arm is feeling tired.     Pertinent History  Patient was diagnosed on 03/06/17 with right grade 3 invasive ductal carcinoma breast cancer. It measures 2.5 cm and is located in the lower outer quadrant. It is ER/PR positive and HER2 negative with a Ki67 of 85%. She had an axillary lymph node biopsied and it was positive., R mastectomy and ALND (pt reports they took all of them out) on 08/14/17    Patient Stated Goals  to evaluate the swelling    Currently in Pain?  No/denies         [facility] PT Assessment - 02/13/18 0001      AROM   Right Shoulder Flexion  174 Degrees    Right Shoulder ABduction  170 Degrees                    [facility] Adult PT Treatment/Exercise - 02/13/18 0001      Shoulder Exercises: Supine   Horizontal ABduction  Strengthening;Both;10 reps;Theraband Pt returned therapist demo for all following supine exs    Theraband Level (Shoulder Horizontal ABduction)  Level 3 (Green)    External Rotation  Strengthening;Both;10 reps;Right;Theraband    Theraband Level (Shoulder External Rotation)  Level 3 (Green)    Flexion  Strengthening;Both;10 reps;Theraband Narrow and Wide Grip, 10 times    Theraband Level (Shoulder Flexion)  Level 3 (Green)    Diagonals  Strengthening;Right;Left;10 reps;Theraband    Theraband Level (Shoulder Diagonals)  Level 3 (Green)      Shoulder Exercises: Standing   Other Standing Exercises  3 way raises with back against wall/core engaged, 1 lb and 15x each, pt returned excellent demonstration      Shoulder Exercises: Pulleys   Flexion  2 minutes    ABduction  2 minutes      Shoulder Exercises: Therapy Ball   Flexion  Both;10 reps With forward lean into end of stretch    ABduction  Right;10 reps Same side lean into end of stretch  Manual Therapy   Passive ROM  In supine, to right shoulder into er, abduction, flexion, and D2                  PT Long Term Goals - 02/11/18 0813      PT LONG TERM GOAL #1   Title  Pt will be independent in a home exercise program for continued strengthening and stretching of right shoulder    Status  On-going      PT LONG TERM GOAL #2   Title  Pt will receive an appropriate compression garment for right lateral trunk lymphedema and a compression sleeve for RUE early stage lymphedema    Baseline  Pt has 2 compression sleeves and has ordered sports bras from [employer], is going ot look into a compression bra as well -02/11/18    Status  Partially Met      PT LONG TERM GOAL #3   Title  Pt will be independent in self MLD for long term management of right lateral trunk lymphedema    Status   Achieved      Breast Clinic Goals - 03/19/17 1142      Patient will be able to verbalize understanding of pertinent lymphedema risk reduction practices relevant to her diagnosis specifically related to skin care.   Time  1    Period  Days    Status  Achieved      Patient will be able to return demonstrate and/or verbalize understanding of the post-op home exercise program related to regaining shoulder range of motion.   Time  1    Period  Days    Status  Achieved      Patient will be able to verbalize understanding of the importance of attending the postoperative After Breast Cancer Class for further lymphedema risk reduction education and therapeutic exercise.   Time  1    Period  Days    Status  Achieved           Plan - 02/13/18 4403    Clinical Impression Statement  Assessed pt's ROM and pt has now regained full AROM. Upgraded red theraband to green theraband since pt stated the red was getting easier. Issued 3 way shoulder raises as part of pt's HEP. Will focus more on strengthening exercises moving forward.    Rehab Potential  Excellent    Clinical Impairments Affecting Rehab Potential  None    PT Frequency  3x / week    PT Duration  4 weeks    PT Treatment/Interventions  Patient/family education;Therapeutic exercise;ADLs/Self Care Home Management;Manual lymph drainage;Manual techniques;Compression bandaging;Scar mobilization;Passive range of motion;Taping;Vasopneumatic Device    PT Next Visit Plan  instruct in Strength ABC program, pt is progressing towards discharge    PT Home Exercise Plan  Post op shoulder ROM HEP, dowel and doorway stretches, lower trunk rotation with arms outstretched, 3 way raises    Consulted and Agree with Plan of Care  Patient       Patient will benefit from skilled therapeutic intervention in order to improve the following deficits and impairments:  Postural dysfunction, Decreased knowledge of precautions, Increased fascial restricitons,  Increased edema  Visit Diagnosis: Postmastectomy lymphedema  Stiffness of right shoulder, not elsewhere classified  Abnormal posture  Muscle weakness (generalized)     Problem List Patient Active Problem List   Diagnosis Date Noted  . Neutropenic fever ([place]) 07/06/2017  . Normocytic anemia 07/06/2017  . Febrile neutropenia ([place]) 05/14/2017  .  Malignant neoplasm of lower-outer quadrant of right breast of female, estrogen receptor positive ([place]) 03/17/2017  . Postpartum care following vaginal delivery (8/3) 03/15/2016  . Pregnancy 03/14/2016  . Normal labor 03/14/2016  . Rh negative status during pregnancy 03/14/2016    Allyson Sabal [facility] 02/13/2018, 8:45 AM  [place], [place], [address] Phone: [phone]   Fax:  [phone]  Name: Ana Wu MRN: [id number] Date of Birth: 12-30-1980  Manus Gunning, PT 02/13/18 8:45 AM

## 2018-02-16 ENCOUNTER — Ambulatory Visit: Payer: BC Managed Care – PPO

## 2018-02-16 DIAGNOSIS — I972 Postmastectomy lymphedema syndrome: Secondary | ICD-10-CM | POA: Diagnosis not present

## 2018-02-16 DIAGNOSIS — R293 Abnormal posture: Secondary | ICD-10-CM

## 2018-02-16 DIAGNOSIS — M25611 Stiffness of right shoulder, not elsewhere classified: Secondary | ICD-10-CM

## 2018-02-16 DIAGNOSIS — M6281 Muscle weakness (generalized): Secondary | ICD-10-CM

## 2018-02-16 NOTE — Therapy (Signed)
[place], [place], [address] Phone: [phone]   Fax:  [phone]  Physical Therapy Treatment  Patient Details  Name: Ana Wu MRN: [id number] Date of Birth: 11/02/1980 Referring Provider: Lalla Brothers Deimler NP Deitra Mayo Dr.)   Encounter Date: 02/16/2018  PT End of Session - 02/16/18 0849    Visit Number  8    Number of Visits  13    Date for PT Re-Evaluation  03/04/18    PT Start Time  0809 pt arrived late    PT Stop Time  0848    PT Time Calculation (min)  39 min    Activity Tolerance  Patient tolerated treatment well    Behavior During Therapy  [facility] for tasks assessed/performed       Past Medical History:  Diagnosis Date  . Cancer ([place]) 02/2017   Breast Right     Past Surgical History:  Procedure Laterality Date  . BREAST FIBROADENOMA SURGERY     2001, 2007  . PORTACATH PLACEMENT Left 03/27/2017   Procedure: INSERTION PORT-A-CATH;  Surgeon: Stark Klein, MD;  Location: [place];  Service: General;  Laterality: Left;  . WISDOM TOOTH EXTRACTION      There were no vitals filed for this visit.  Subjective Assessment - 02/16/18 0813    Subjective  Nothing much new since I was here last except I'm on Bactrim for a UTI, not sure what the dose is though. No problems with the new HEP issued last week, they are going well.     Pertinent History  Patient was diagnosed on 03/06/17 with right grade 3 invasive ductal carcinoma breast cancer. It measures 2.5 cm and is located in the lower outer quadrant. It is ER/PR positive and HER2 negative with a Ki67 of 85%. She had an axillary lymph node biopsied and it was positive., R mastectomy and ALND (pt reports they took all of them out) on 08/14/17    Patient Stated Goals  to evaluate the swelling    Currently in Pain?  No/denies                       [facility] Adult PT Treatment/Exercise - 02/16/18 0001      Shoulder Exercises:  Standing   Other Standing Exercises  Instructed pt in Strength ABC Program with pt returning therapist demonstration, performed 1 rep of 15 sec holds of all stretches and then 5 reps of all strengthening exercises (for times sake)             PT Education - 02/16/18 0849    Education provided  Yes    Education Details  Strength ABc Program and how to slowly and safely increase weights with keeping risk of lymphedema low    Person(s) Educated  Patient    Methods  Explanation;Demonstration;Handout    Comprehension  Verbalized understanding;Returned demonstration          PT Long Term Goals - 02/11/18 0813      PT LONG TERM GOAL #1   Title  Pt will be independent in a home exercise program for continued strengthening and stretching of right shoulder    Status  On-going      PT LONG TERM GOAL #2   Title  Pt will receive an appropriate compression garment for right lateral trunk lymphedema and a compression sleeve for RUE early stage lymphedema    Baseline  Pt has 2 compression sleeves and has  ordered sports bras from [employer] [place], is going ot look into a compression bra as well -02/11/18    Status  Partially Met      PT LONG TERM GOAL #3   Title  Pt will be independent in self MLD for long term management of right lateral trunk lymphedema    Status  Achieved      Breast Clinic Goals - 03/19/17 1142      Patient will be able to verbalize understanding of pertinent lymphedema risk reduction practices relevant to her diagnosis specifically related to skin care.   Time  1    Period  Days    Status  Achieved      Patient will be able to return demonstrate and/or verbalize understanding of the post-op home exercise program related to regaining shoulder range of motion.   Time  1    Period  Days    Status  Achieved      Patient will be able to verbalize understanding of the importance of attending the postoperative After Breast Cancer Class for further lymphedema risk  reduction education and therapeutic exercise.   Time  1    Period  Days    Status  Achieved           Plan - 02/16/18 0850    Clinical Impression Statement  Instructed pt in Strenght ABC Program today which she tolerated very well. Decreasing her frequency this week to 2x/wk keeping her Friday appt so pt can try exercises in packet before then and bring back any questions.     Rehab Potential  Excellent    Clinical Impairments Affecting Rehab Potential  None    PT Frequency  3x / week    PT Duration  4 weeks    PT Treatment/Interventions  Patient/family education;Therapeutic exercise;ADLs/Self Care Home Management;Manual lymph drainage;Manual techniques;Compression bandaging;Scar mobilization;Passive range of motion;Taping;Vasopneumatic Device    PT Next Visit Plan  Review Strength ABC Program; consider D/C in next few visits.     Consulted and Agree with Plan of Care  Patient       Patient will benefit from skilled therapeutic intervention in order to improve the following deficits and impairments:  Postural dysfunction, Decreased knowledge of precautions, Increased fascial restricitons, Increased edema  Visit Diagnosis: Postmastectomy lymphedema  Stiffness of right shoulder, not elsewhere classified  Abnormal posture  Muscle weakness (generalized)     Problem List Patient Active Problem List   Diagnosis Date Noted  . Neutropenic fever ([place]) 07/06/2017  . Normocytic anemia 07/06/2017  . Febrile neutropenia ([place]) 05/14/2017  . Malignant neoplasm of lower-outer quadrant of right breast of female, estrogen receptor positive ([place]) 03/17/2017  . Postpartum care following vaginal delivery (8/3) 03/15/2016  . Pregnancy 03/14/2016  . Normal labor 03/14/2016  . Rh negative status during pregnancy 03/14/2016    Otelia Limes, PTA 02/16/2018, 8:52 AM  [place], [place],  [address] Phone: [phone]   Fax:  [phone]  Name: SHAWNEE GAMBONE MRN: [id number] Date of Birth: 14-Jan-1981

## 2018-02-20 ENCOUNTER — Ambulatory Visit: Payer: BC Managed Care – PPO | Admitting: Rehabilitation

## 2018-02-20 ENCOUNTER — Encounter: Payer: Self-pay | Admitting: Rehabilitation

## 2018-02-20 DIAGNOSIS — M6281 Muscle weakness (generalized): Secondary | ICD-10-CM

## 2018-02-20 DIAGNOSIS — Z170 Estrogen receptor positive status [ER+]: Secondary | ICD-10-CM

## 2018-02-20 DIAGNOSIS — I972 Postmastectomy lymphedema syndrome: Secondary | ICD-10-CM | POA: Diagnosis not present

## 2018-02-20 DIAGNOSIS — C50511 Malignant neoplasm of lower-outer quadrant of right female breast: Secondary | ICD-10-CM

## 2018-02-20 NOTE — Therapy (Signed)
[place], [place], [address] Phone: [phone]   Fax:  [phone]  Physical Therapy Treatment  Patient Details  Name: Ana Wu MRN: [id number] Date of Birth: 05/02/1981 Referring Provider: Lalla Brothers Deimler NP Deitra Mayo Dr.)   Encounter Date: 02/20/2018  PT End of Session - 02/20/18 0821    Visit Number  9    Number of Visits  13    Date for PT Re-Evaluation  03/04/18    PT Start Time  0800    PT Stop Time  0835    PT Time Calculation (min)  35 min    Activity Tolerance  Patient tolerated treatment well    Behavior During Therapy  [facility] for tasks assessed/performed       Past Medical History:  Diagnosis Date  . Cancer ([place]) 02/2017   Breast Right     Past Surgical History:  Procedure Laterality Date  . BREAST FIBROADENOMA SURGERY     2001, 2007  . PORTACATH PLACEMENT Left 03/27/2017   Procedure: INSERTION PORT-A-CATH;  Surgeon: Stark Klein, MD;  Location: [place];  Service: General;  Laterality: Left;  . WISDOM TOOTH EXTRACTION      There were no vitals filed for this visit.  Subjective Assessment - 02/20/18 0803    Subjective  I feel ready to graduate    Pertinent History  Patient was diagnosed on 03/06/17 with right grade 3 invasive ductal carcinoma breast cancer. It measures 2.5 cm and is located in the lower outer quadrant. It is ER/PR positive and HER2 negative with a Ki67 of 85%. She had an axillary lymph node biopsied and it was positive., R mastectomy and ALND (pt reports they took all of them out) on 08/14/17    Currently in Pain?  No/denies                       [facility] Adult PT Treatment/Exercise - 02/20/18 0001      Exercises   Exercises  Other Exercises    Other Exercises   was able to perform strength ABC program with only question being how much weight to start with and how many days per week.  Performed whole strength ABC program with 30sec holds  on stretches bilaterally and 5 of each strengthening exercise                  PT Long Term Goals - 02/20/18 0826      PT LONG TERM GOAL #1   Title  Pt will be independent in a home exercise program for continued strengthening and stretching of right shoulder    Status  Achieved      PT LONG TERM GOAL #2   Title  Pt will receive an appropriate compression garment for right lateral trunk lymphedema and a compression sleeve for RUE early stage lymphedema    Status  Achieved      PT LONG TERM GOAL #3   Title  Pt will be independent in self MLD for long term management of right lateral trunk lymphedema    Status  Achieved      Breast Clinic Goals - 03/19/17 1142      Patient will be able to verbalize understanding of pertinent lymphedema risk reduction practices relevant to her diagnosis specifically related to skin care.   Time  1    Period  Days    Status  Achieved      Patient  will be able to return demonstrate and/or verbalize understanding of the post-op home exercise program related to regaining shoulder range of motion.   Time  1    Period  Days    Status  Achieved      Patient will be able to verbalize understanding of the importance of attending the postoperative After Breast Cancer Class for further lymphedema risk reduction education and therapeutic exercise.   Time  1    Period  Days    Status  Achieved             Patient will benefit from skilled therapeutic intervention in order to improve the following deficits and impairments:     Visit Diagnosis: Postmastectomy lymphedema  Malignant neoplasm of lower-outer quadrant of right breast of female, estrogen receptor positive ([facility])  Muscle weakness (generalized)     Problem List Patient Active Problem List   Diagnosis Date Noted  . Neutropenic fever ([place]) 07/06/2017  . Normocytic anemia 07/06/2017  . Febrile neutropenia ([place]) 05/14/2017  . Malignant neoplasm of lower-outer quadrant of  right breast of female, estrogen receptor positive ([place]) 03/17/2017  . Postpartum care following vaginal delivery (8/3) 03/15/2016  . Pregnancy 03/14/2016  . Normal labor 03/14/2016  . Rh negative status during pregnancy 03/14/2016    Shan Levans, PT 02/20/2018, 8:36 AM  [place], [place], [address] Phone: [phone]   Fax:  [phone]  Name: Ana Wu MRN: [id number] Date of Birth: [birth year]

## 2018-12-24 ENCOUNTER — Other Ambulatory Visit: Payer: Self-pay

## 2018-12-24 ENCOUNTER — Emergency Department (HOSPITAL_COMMUNITY)
Admission: EM | Admit: 2018-12-24 | Discharge: 2018-12-24 | Disposition: A | Discharge status: Home / Self Care | Payer: BC Managed Care – PPO | Attending: Emergency Medicine | Admitting: Emergency Medicine

## 2018-12-24 DIAGNOSIS — R2231 Localized swelling, mass and lump, right upper limb: Secondary | ICD-10-CM | POA: Diagnosis present

## 2018-12-24 DIAGNOSIS — Z79899 Other long term (current) drug therapy: Secondary | ICD-10-CM | POA: Insufficient documentation

## 2018-12-24 DIAGNOSIS — L539 Erythematous condition, unspecified: Secondary | ICD-10-CM | POA: Insufficient documentation

## 2018-12-24 DIAGNOSIS — R609 Edema, unspecified: Secondary | ICD-10-CM

## 2018-12-24 DIAGNOSIS — Z853 Personal history of malignant neoplasm of breast: Secondary | ICD-10-CM | POA: Insufficient documentation

## 2018-12-24 LAB — APTT: aPTT: 31 seconds (ref 24–36)

## 2018-12-24 LAB — CBC WITH DIFFERENTIAL/PLATELET
Abs Immature Granulocytes: 0.01 10*3/uL (ref 0.00–0.07)
Basophils Absolute: 0 10*3/uL (ref 0.0–0.1)
Basophils Relative: 0 %
Eosinophils Absolute: 0 10*3/uL (ref 0.0–0.5)
Eosinophils Relative: 1 %
HCT: 34.9 % — ABNORMAL LOW (ref 36.0–46.0)
Hemoglobin: 11.7 g/dL — ABNORMAL LOW (ref 12.0–15.0)
Immature Granulocytes: 0 %
Lymphocytes Relative: 29 %
Lymphs Abs: 1.4 10*3/uL (ref 0.7–4.0)
MCH: 30.5 pg (ref 26.0–34.0)
MCHC: 33.5 g/dL (ref 30.0–36.0)
MCV: 91.1 fL (ref 80.0–100.0)
Monocytes Absolute: 0.3 10*3/uL (ref 0.1–1.0)
Monocytes Relative: 7 %
Neutro Abs: 3 10*3/uL (ref 1.7–7.7)
Neutrophils Relative %: 63 %
Platelets: 250 10*3/uL (ref 150–400)
RBC: 3.83 MIL/uL — ABNORMAL LOW (ref 3.87–5.11)
RDW: 14.3 % (ref 11.5–15.5)
WBC: 4.8 10*3/uL (ref 4.0–10.5)
nRBC: 0 % (ref 0.0–0.2)

## 2018-12-24 LAB — COMPREHENSIVE METABOLIC PANEL
ALT: 17 U/L (ref 0–44)
AST: 21 U/L (ref 15–41)
Albumin: 4.1 g/dL (ref 3.5–5.0)
Alkaline Phosphatase: 47 U/L (ref 38–126)
Anion gap: 11 (ref 5–15)
BUN: 12 mg/dL (ref 6–20)
CO2: 23 mmol/L (ref 22–32)
Calcium: 9.2 mg/dL (ref 8.9–10.3)
Chloride: 102 mmol/L (ref 98–111)
Creatinine, Ser: 0.96 mg/dL (ref 0.44–1.00)
GFR calc Af Amer: >60 mL/min (ref >60)
GFR calc non Af Amer: >60 mL/min (ref >60)
Glucose, Bld: 114 mg/dL — ABNORMAL HIGH (ref 70–99)
Potassium: 3.7 mmol/L (ref 3.5–5.1)
Sodium: 136 mmol/L (ref 135–145)
Total Bilirubin: 0.6 mg/dL (ref 0.3–1.2)
Total Protein: 7.5 g/dL (ref 6.5–8.1)

## 2018-12-24 LAB — I-STAT BETA HCG BLOOD, ED (MC, WL, AP ONLY): I-stat hCG, quantitative: <5 m[IU]/mL (ref <5)

## 2018-12-24 LAB — PROTIME-INR
INR: 1 (ref 0.8–1.2)
Prothrombin Time: 13.3 seconds (ref 11.4–15.2)

## 2018-12-24 LAB — LACTIC ACID, PLASMA: Lactic Acid, Venous: 1.9 mmol/L (ref 0.5–1.9)

## 2018-12-24 NOTE — ED Provider Notes (Signed)
11:30 PM handoff from [place], PA-C at shift change.  Patient with right upper extremity erythema.  She has had this for about 6 weeks.  She has been on multiple different antibiotics.  Was awaiting lab work.  This is reassuring.  Patient will be discharged home at this time.  Order for ultrasound already placed.    She may need to follow-up with dermatology.  She states that she has seen a dermatologist in the past.  Encouraged return with worsening or new symptoms.  BP 116/66   Pulse 73   Temp 98.2 F (36.8 C) (Oral)   Resp 18   Ht 5\' 3"  (1.6 m)   Wt 80.7 kg   SpO2 98%   BMI 31.53 kg/m     Carlisle Cater, PA-C 12/24/18 2332    Sherwood Gambler, MD 12/30/18 [phone]

## 2018-12-24 NOTE — ED Triage Notes (Addendum)
Pt to ED per recommendation of oncologist. Ana Wu treatment at [facility]. Reports she is a breast cancer survier dx July 2018, had all lymph nodes removed from right breast. Since April pt has had discomfort and redness to right forearm. Dx with cellulitis, but has not responded well to treatment/antibiotics. Pt currently on Bactrim Antibiotic regimen for treatment of cellulitis. Pt woke up this morning and arm was more red than normal. Pt currently reports minimal discomfort to right forearm.

## 2018-12-24 NOTE — ED Provider Notes (Signed)
[facility] DEPT Provider Note   CSN: [id number] Arrival date & time: 12/24/18  1836    History   Chief Complaint Chief Complaint  Patient presents with  . right forearm redness    currently being treated for cellulitis     HPI Ana Wu is a 38 y.o. female.     HPI   Ana Wu is a 38 y.o. female, with a history of breast cancer, presenting to the ED with an area of erythema and swelling to the right forearm since the beginning of April.  She noticed the area the morning after working in the yard previous day.  She was wearing her compression sleeve on this arm while working outside.  She denies known bites or injuries. Cellulitis was suspected she was placed on 1 week of Keflex then 2 weeks of doxycycline, then 2 weeks of Bactrim 1 pill twice daily.  Starting May 11, dose of Bactrim was increased to two pills twice daily.  She states the redness receded somewhat following the Keflex, but then recurred.  She followed up with her oncologist from [facility] on this matter and was told to come to the ED. Patient has previous history of breast cancer.  Last chemotherapy April 2019.  Modified radical mastectomy with lymph node removal on the right performed January 19.  Left mastectomy performed December 2018.  Denies pain to the forearm, fever/chills, numbness, weakness, chest pain, shortness of breath, or any other complaints.    Past Medical History:  Diagnosis Date  . Cancer ([place]) 02/2017   Breast Right     Patient Active Problem List   Diagnosis Date Noted  . Neutropenic fever ([place]) 07/06/2017  . Normocytic anemia 07/06/2017  . Febrile neutropenia ([place]) 05/14/2017  . Malignant neoplasm of lower-outer quadrant of right breast of female, estrogen receptor positive ([place]) 03/17/2017  . Postpartum care following vaginal delivery (8/3) 03/15/2016  . Pregnancy 03/14/2016  . Normal labor 03/14/2016  . Rh negative status during pregnancy  03/14/2016    Past Surgical History:  Procedure Laterality Date  . BREAST FIBROADENOMA SURGERY     2001, 2007  . PORTACATH PLACEMENT Left 03/27/2017   Procedure: INSERTION PORT-A-CATH;  Surgeon: Stark Klein, MD;  Location: [facility];  Service: General;  Laterality: Left;  . WISDOM TOOTH EXTRACTION       OB History    Gravida  2   Para  1   Term  1   Preterm      AB      Living  1     SAB      TAB      Ectopic      Multiple  0   Live Births  1            Home Medications    Prior to Admission medications   Medication Sig Start Date End Date Taking? Authorizing Provider  goserelin (ZOLADEX) 10.8 MG injection Inject 10.8 mg into the skin every 3 (three) months.   Yes [provider]  ibuprofen (ADVIL,MOTRIN) 200 MG tablet Take 400 mg by mouth every 8 (eight) hours as needed (for pain.).   Yes [provider]  letrozole (FEMARA) 2.5 MG tablet Take 2.5 mg by mouth at bedtime.    Yes [provider]  Multiple Vitamins-Minerals (MULTIVITAMIN GUMMIES ADULTS) CHEW Chew 1 each by mouth daily.   Yes [provider]  traZODone (DESYREL) 50 MG tablet Take 25 mg by mouth at  bedtime.   Yes [provider]  lidocaine-prilocaine (EMLA) cream Apply to affected area once Patient not taking: Reported on 12/24/2018 04/01/17   Nicholas Lose, MD  LORazepam (ATIVAN) 0.5 MG tablet Take 1 tablet (0.5 mg total) by mouth at bedtime. Patient not taking: Reported on 12/24/2018 04/01/17   Nicholas Lose, MD  ondansetron (ZOFRAN) 8 MG tablet Take 1 tablet (8 mg total) by mouth 2 (two) times daily as needed. Start on the third day after chemotherapy. Patient not taking: Reported on 12/24/2018 04/01/17   Nicholas Lose, MD  prochlorperazine (COMPAZINE) 10 MG tablet Take 1 tablet (10 mg total) by mouth every 6 (six) hours as needed (Nausea or vomiting). Patient not taking: Reported on 12/24/2018 04/01/17   Nicholas Lose, MD    Family History Family History   Problem Relation Age of Onset  . Pancreatic cancer Maternal Grandmother   . Lung cancer Paternal Grandmother   . Prostate cancer Paternal Grandfather   . Breast cancer Paternal Aunt   . Breast cancer Paternal Aunt     Social History Social History   Tobacco Use  . Smoking status: Never Smoker  . Smokeless tobacco: Never Used  Substance Use Topics  . Alcohol use: Yes    Comment: occas  . Drug use: No     Allergies   Patient has no known allergies.   Review of Systems Review of Systems  Constitutional: Negative for chills, diaphoresis and fever.  Respiratory: Negative for shortness of breath.   Cardiovascular: Negative for chest pain.  Gastrointestinal: Negative for abdominal pain, nausea and vomiting.  Musculoskeletal: Negative for arthralgias, back pain and neck pain.  Skin: Positive for color change. Negative for wound.  Neurological: Negative for weakness and numbness.  All other systems reviewed and are negative.    Physical Exam Updated Vital Signs BP (!) 147/93 (BP Location: Left Arm)   Pulse 86   Temp 98.2 F (36.8 C) (Oral)   Resp 16   Ht 5\' 3"  (1.6 m)   Wt 80.7 kg   SpO2 100%   BMI 31.53 kg/m   Physical Exam Vitals signs and nursing note reviewed.  Constitutional:      General: She is not in acute distress.    Appearance: She is well-developed. She is not diaphoretic.  HENT:     Head: Normocephalic and atraumatic.     Mouth/Throat:     Mouth: Mucous membranes are moist.     Pharynx: Oropharynx is clear.  Eyes:     Conjunctiva/sclera: Conjunctivae normal.  Neck:     Musculoskeletal: Neck supple.  Cardiovascular:     Rate and Rhythm: Normal rate and regular rhythm.     Pulses: Normal pulses.          Radial pulses are 2+ on the right side and 2+ on the left side.       Posterior tibial pulses are 2+ on the right side and 2+ on the left side.     Heart sounds: Normal heart sounds.     Comments: Tactile temperature in the extremities  appropriate and equal bilaterally. 2+ radial and ulnar pulses bilaterally. Pulmonary:     Effort: Pulmonary effort is normal. No respiratory distress.     Breath sounds: Normal breath sounds.  Abdominal:     Palpations: Abdomen is soft.     Tenderness: There is no abdominal tenderness. There is no guarding.  Musculoskeletal:     Right lower leg: No edema.     Left  lower leg: No edema.     Comments: No tenderness throughout the upper extremities. Full range of motion in each of the major joints of the upper extremities without pain or difficulty. No swelling or instability noted to the joints of the upper extremities.  Lymphadenopathy:     Cervical: No cervical adenopathy.  Skin:    General: Skin is warm and dry.     Capillary Refill: Capillary refill takes less than 2 seconds.     Comments: Area of erythema with questionable swelling to the right ulnar forearm.  No tenderness or lesions noted.  It could be argued that patient has similar presentation on the left forearm and has some diffuse erythema through the forearms.  Neurological:     Mental Status: She is alert.     Comments: Sensation grossly intact to light touch through each of the nerve distributions of the bilateral upper extremities. Abduction and adduction of the fingers intact against resistance. Grip strength equal bilaterally. Supination and pronation intact against resistance. Strength 5/5 through the cardinal directions of the bilateral wrists. Strength 5/5 with flexion and extension of the bilateral elbows. Patient can touch the thumb to each one of the fingertips without difficulty.   Psychiatric:        Mood and Affect: Mood and affect normal.        Speech: Speech normal.        Behavior: Behavior normal.            ED Treatments / Results  Labs (all labs ordered are listed, but only abnormal results are displayed) Labs Reviewed  CBC WITH DIFFERENTIAL/PLATELET - Abnormal; Notable for the following  components:      Result Value   RBC 3.83 (*)    Hemoglobin 11.7 (*)    HCT 34.9 (*)    All other components within normal limits  COMPREHENSIVE METABOLIC PANEL  PROTIME-INR  APTT  LACTIC ACID, PLASMA  LACTIC ACID, PLASMA  I-STAT BETA HCG BLOOD, ED ([facility], [facility], [facility] ONLY)    EKG None  Radiology No results found.  Procedures Procedures (including critical care time)  Medications Ordered in ED Medications - No data to display   Initial Impression / Assessment and Plan / ED Course  I have reviewed the triage vital signs and the nursing notes.  Pertinent labs & imaging results that were available during my care of the patient were reviewed by me and considered in my medical decision making (see chart for details).        Patient presents with erythema to the right forearm.  Area is nontender and with questionable swelling.  She has been through multiple antibiotics.  Low suspicion for sepsis.  End of shift patient care handoff report given to Alecia Lemming, PA-C. Plan: Awaiting lab results.  Once these are returned and there are no concerning abnormalities, discharge patient.  An order has been placed for her to go to [facility] tomorrow for duplex ultrasound of the upper extremities. Other than that, she will follow-up with dermatology.    Final Clinical Impressions(s) / ED Diagnoses   Final diagnoses:  None    ED Discharge Orders         Ordered    UE VENOUS DUPLEX     12/24/18 2004           Layla Maw 12/24/18 2213    Sherwood Gambler, MD 12/24/18 2257

## 2018-12-24 NOTE — ED Notes (Signed)
IV team at bedside 

## 2018-12-24 NOTE — Discharge Instructions (Addendum)
Please return tomorrow for ultrasound of your right upper extremity.  Follow-up with your doctor as planned.  Please return with worsening redness, swelling, fevers, or new symptoms.

## 2018-12-24 NOTE — ED Notes (Signed)
Attempted IV access x1 and unsuccessful. Patient has restricted right extremity. Will put in consult for IV team.

## 2018-12-25 ENCOUNTER — Ambulatory Visit (HOSPITAL_COMMUNITY)
Admission: RE | Admit: 2018-12-25 | Discharge: 2018-12-25 | Disposition: A | Discharge status: Home / Self Care | Payer: BC Managed Care – PPO | Source: Ambulatory Visit | Attending: Emergency Medicine | Admitting: Emergency Medicine

## 2018-12-25 ENCOUNTER — Other Ambulatory Visit: Payer: Self-pay

## 2018-12-25 DIAGNOSIS — R609 Edema, unspecified: Secondary | ICD-10-CM | POA: Diagnosis present

## 2018-12-25 NOTE — Progress Notes (Signed)
Right upper extremity venous duplex completed. Refer to "CV Proc" under chart review to view preliminary results.  12/25/2018 11:59 AM Maudry Mayhew, MHA, RVT, RDCS, RDMS

## 2019-03-15 IMAGING — NM NM BONE WHOLE BODY
2 series · 2 of 2 positions shown · non-contrast
Comparison: CT chest and abdomen 03/26/2017.

CLINICAL DATA: Breast cancer.

EXAM:
NUCLEAR MEDICINE WHOLE BODY BONE SCAN
TECHNIQUE: Whole body anterior and posterior images were obtained approximately
3 hours after intravenous injection of radiopharmaceutical.
RADIOPHARMACEUTICALS:  21.6 MCi Lechnetium-XXm MDP IV

[Series 1: wbr_bone_40 whole body · 2.66mm/px · 1 of 1 slices shown (1 of 2)]
[im 1/1]
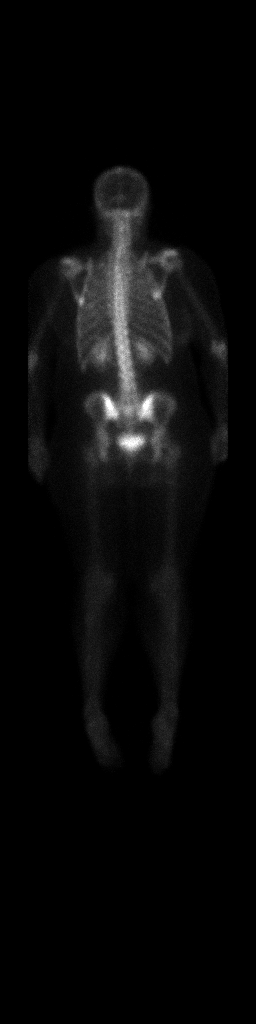

[Series 1: wbr_bone_40 whole body · 2.66mm/px · 1 of 1 slices shown (2 of 2)]
[im 1/1]
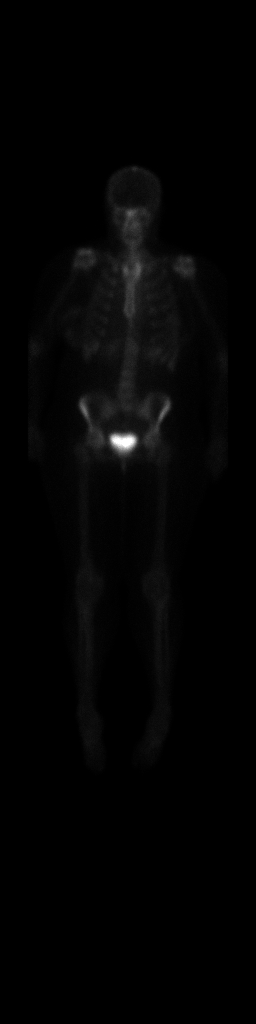

[2 of 2 positions shown; findings below may reference images not displayed]

FINDINGS: Bilateral renal function disease. Increased activity noted over the
right breast. This may be related to the patient's right breast
cancer. No focal bony abnormality identified. No evidence of
metastatic disease.
IMPRESSION: 1. Increased activity noted over the right breast. This may be
related to the patient's right breast cancer.

2. No focal bony abnormalities identified. No evidence of bony
metastatic disease.

## 2019-03-16 IMAGING — DX DG CHEST 1V PORT
1 series · 1 of 1 positions shown · non-contrast
Comparison: CT scan of the chest dated March 26, 2017

CLINICAL DATA: Status post porta catheter placement.

EXAM:
PORTABLE CHEST 1 VIEW

[chest ap]
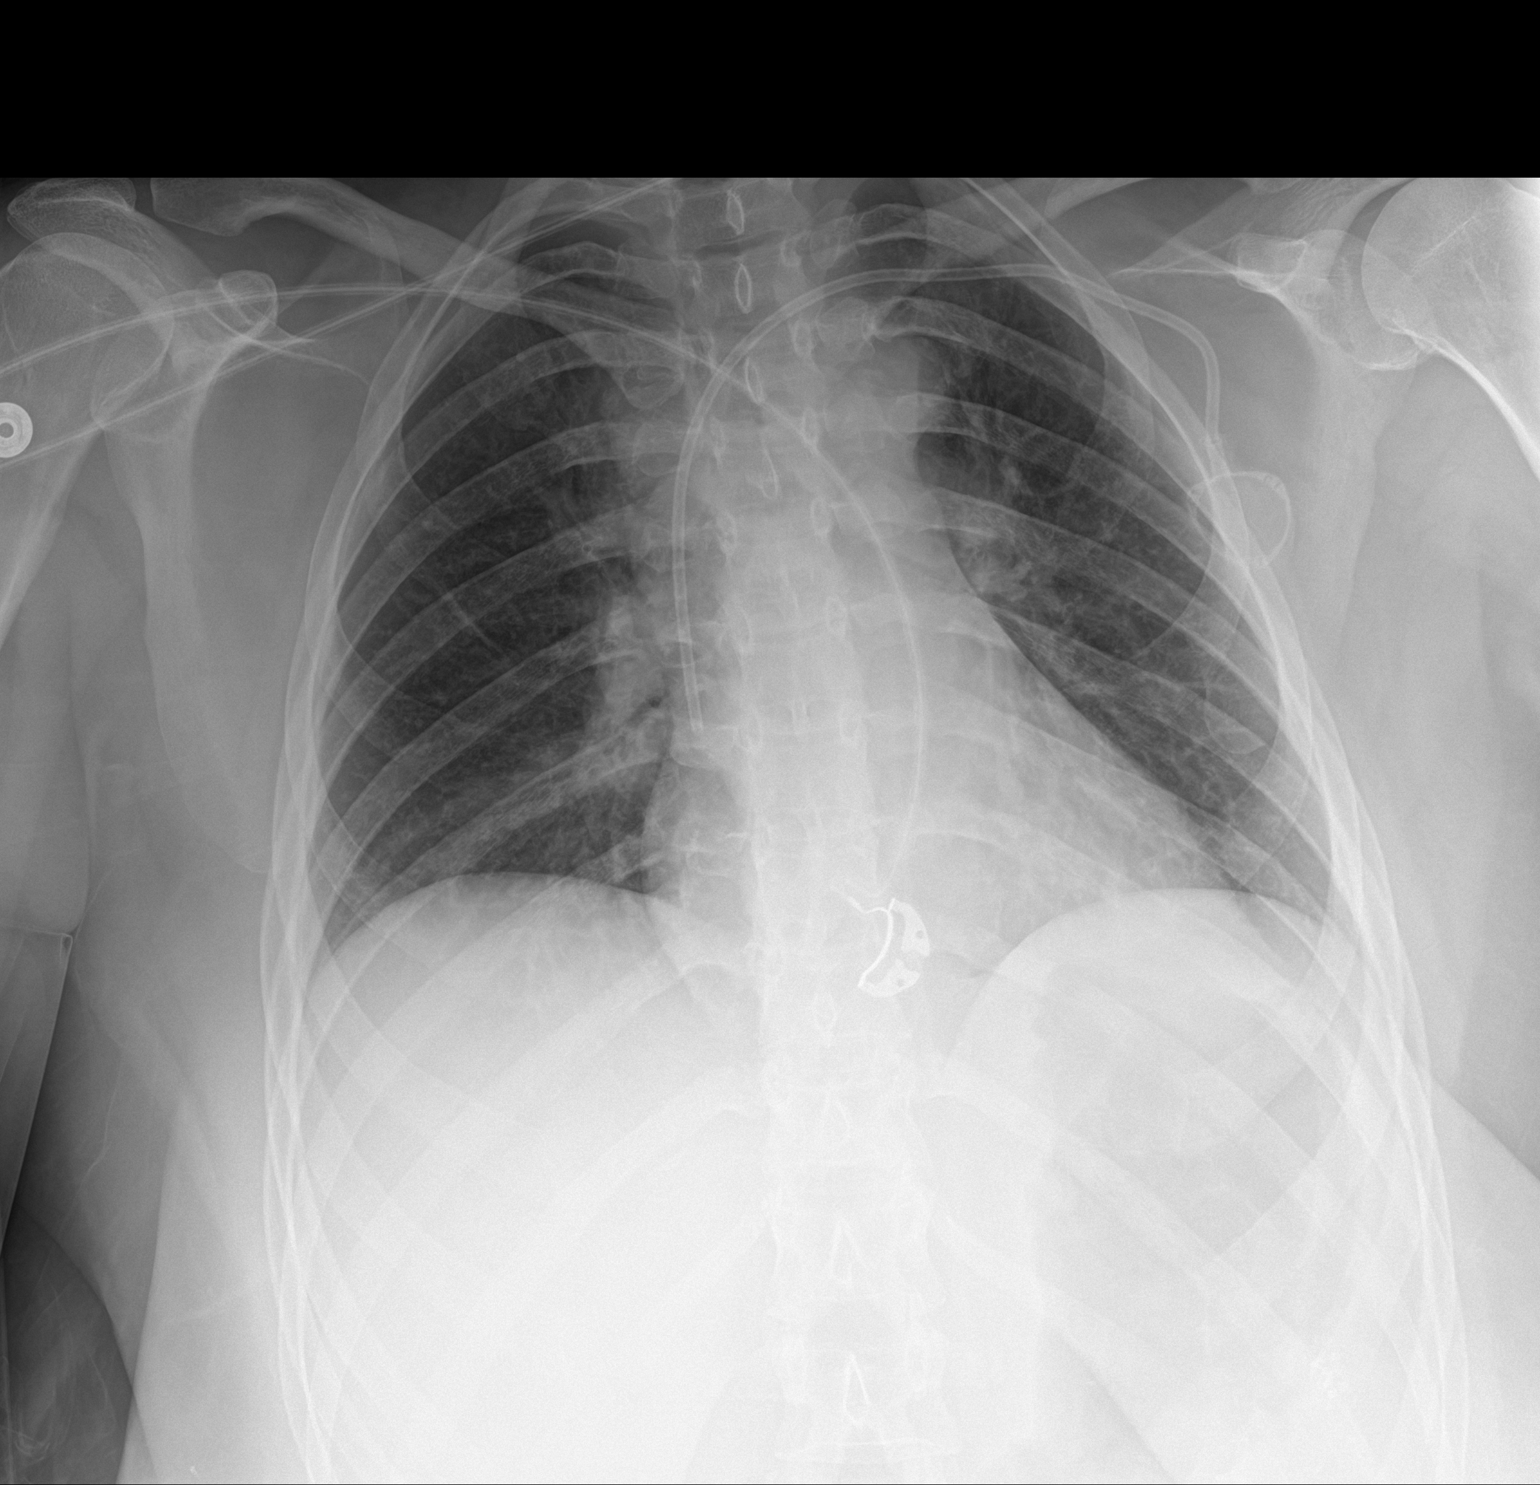

[1 of 1 positions shown; findings below may reference images not displayed]

FINDINGS: The lungs are well-expanded and clear. There is no postprocedure
pneumothorax or hemothorax. The heart is normal in size. The
pulmonary vascularity is not engorged. There is no pleural effusion.
The tip of the porta catheter projects at the junction of the middle
and distal thirds of the SVC. The bony thorax exhibits no acute
abnormality.
IMPRESSION: There is no immediate postprocedure complication following portacath
placement.

## 2019-04-28 ENCOUNTER — Other Ambulatory Visit: Payer: Self-pay

## 2019-04-28 ENCOUNTER — Ambulatory Visit: Payer: BC Managed Care – PPO | Attending: Nurse Practitioner

## 2019-04-28 DIAGNOSIS — M25611 Stiffness of right shoulder, not elsewhere classified: Secondary | ICD-10-CM | POA: Diagnosis present

## 2019-04-28 DIAGNOSIS — C50511 Malignant neoplasm of lower-outer quadrant of right female breast: Secondary | ICD-10-CM | POA: Diagnosis present

## 2019-04-28 DIAGNOSIS — I972 Postmastectomy lymphedema syndrome: Secondary | ICD-10-CM | POA: Insufficient documentation

## 2019-04-28 DIAGNOSIS — Z170 Estrogen receptor positive status [ER+]: Secondary | ICD-10-CM | POA: Insufficient documentation

## 2019-04-28 DIAGNOSIS — M6281 Muscle weakness (generalized): Secondary | ICD-10-CM | POA: Diagnosis present

## 2019-04-28 DIAGNOSIS — R293 Abnormal posture: Secondary | ICD-10-CM | POA: Insufficient documentation

## 2019-04-28 NOTE — Patient Instructions (Signed)
Access Code: [email or web address]  URL: [email or web address]  Date: 04/28/2019  Prepared by: Tomma Rakers

## 2019-04-28 NOTE — Therapy (Signed)
[facility] [place], [place], [address] Phone: [phone]   Fax:  [phone]  Physical Therapy Evaluation  Patient Details  Name: MARGEAUX SWANTEK MRN: [id number] Date of Birth: [birth year] No data recorded  Encounter Date: 04/28/2019  PT End of Session - 04/28/19 1820    Visit Number  1    Number of Visits  9    Date for PT Re-Evaluation  06/02/19    PT Start Time  1512    PT Stop Time  1607    PT Time Calculation (min)  55 min    Activity Tolerance  Patient tolerated treatment well    Behavior During Therapy  [facility] for tasks assessed/performed       Past Medical History:  Diagnosis Date  . Cancer ([place]) 02/2017   Breast Right     Past Surgical History:  Procedure Laterality Date  . BREAST FIBROADENOMA SURGERY     2001, 2007  . PORTACATH PLACEMENT Left 03/27/2017   Procedure: INSERTION PORT-A-CATH;  Surgeon: Stark Klein, MD;  Location: [facility];  Service: General;  Laterality: Left;  . WISDOM TOOTH EXTRACTION      There were no vitals filed for this visit.   Subjective Assessment - 04/28/19 1518    Subjective  Pt reports that she came to the cancer clinic for lymphedema treatment back in the summer of 2019 and then earlier this year in April 2020 she started to notice redness in her R forearm. She went to her MD and was treated with an antibiotic but the swelling and redness came back a couple more times. The redness did finally clear up around the end of may and she has not had any pain throughout.    Pertinent History  July 31st, 2020 laparascopic total hysterectomy and B mastectomy with total reconstruction on 09/14/2017 after an initial surgery on 08/03/2017. She took chemo therapy and finished in April of 2019.    Currently in Pain?  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       Cognition   Overall Cognitive Status  Within Functional Limits for tasks assessed      Observation/Other Assessments   Observations  no significant swelling at this time    Skin Integrity  Skin warm, dry and intact      ROM / Strength   AROM / PROM / Strength  AROM      AROM   AROM Assessment Site  Shoulder    Right/Left Shoulder  Right;Left    Right Shoulder Flexion  149 Degrees    Right Shoulder ABduction  163 Degrees    Right Shoulder Internal Rotation  63 Degrees    Right Shoulder External Rotation  76 Degrees    Left Shoulder Flexion  160 Degrees    Left Shoulder ABduction  152 Degrees    Left Shoulder Internal Rotation  70 Degrees    Left Shoulder External Rotation  90 Degrees        LYMPHEDEMA/ONCOLOGY QUESTIONNAIRE - 04/28/19 1539      Surgeries   Mastectomy Date  09/14/17    Radical Mastectomy Date  09/14/17    Other Surgery Date  03/12/19   Total hysterectomy   Number Lymph Nodes Removed  15   All axillary lymph nodes on the R     Date Lymphedema/Swelling Started   Date  11/11/18      Treatment   Active Chemotherapy Treatment  No    Past Chemotherapy Treatment  Yes    Date  11/12/17    Active Radiation Treatment  No    Past Radiation Treatment  No    Current Hormone Treatment  Yes    Date  12/11/18    Drug Name  Femara      What other symptoms do you have   Are you Having Heaviness or Tightness  Yes    Are you having Pain  No    Are you having pitting edema  No    Is it Hard or Difficult finding clothes that fit  No    Do you have infections  No     Is there Decreased scar mobility  No      Lymphedema Stage   Stage  STAGE 2 SPONTANEOUSLY IRREVERSIBLE      Lymphedema Assessments   Lymphedema Assessments  Upper extremities      Right Upper Extremity Lymphedema   10 cm Proximal to Olecranon Process  35 cm    Olecranon Process  28.7 cm    15 cm Proximal to Ulnar Styloid Process  28.5 cm    10 cm Proximal to Ulnar Styloid Process  23.9 cm    Just Proximal to Ulnar Styloid Process  17 cm    Across Hand at [employer]  19.5 cm    At [place] of 2nd Digit  5.9 cm      Left Upper Extremity Lymphedema   10 cm Proximal to Olecranon Process  35.7 cm    Olecranon Process  29 cm    15 cm Proximal to Ulnar Styloid Process  28.4 cm    10 cm Proximal to Ulnar Styloid Process  25 cm    Just Proximal to Ulnar Styloid Process  17 cm    Across Hand at [employer]  19.8 cm    At [place] of 2nd Digit  6 cm             Outpatient Rehab from [facility]/28/[facility]  Lymphedema Life Impact Scale Total Score  13.24 %      Objective measurements completed on examination: See above findings.      [facility] Adult PT Treatment/Exercise - 04/28/19 0001      Exercises   Exercises  Shoulder      Shoulder Exercises: Seated   Other Seated Exercises  Scapular retraction 2x 10 3 s hold      Shoulder Exercises: Standing   Other Standing Exercises  Shouder flexion stretch on counter 10x 5 s hold (pain-free)    Other Standing Exercises  Door pec stretch 5x 10 s very easy with foot fwd to hold wgt and prevent wgt on arms w/demonstration and return demonstration             PT Education - 04/28/19 1818  Education Details  Pt was educated and provided material on risk reduction practices for lymphedema. Discussed stages of lymphedema including spontaneous reversible (stage I) which seems to currently be pt stage of lymphedema. Pt was educated on the use of a sleeve and purchasing new sleeves every 6 months  in order to decrease risk for swelling due to loss of support in garment. Pt was educated on HEP with demonstration and return demonstration.    Person(s) Educated  Patient    Methods  Explanation;Demonstration    Comprehension  Verbalized understanding;Returned demonstration       PT Short Term Goals - 04/28/19 1826      PT SHORT TERM GOAL #1   Title  Pt will demonstrate proper mechanics and techniques w/current HEP in order to adequately address R shoulder stiffness.    Baseline  Pt has no HEP    Time  2    Period  Weeks    Status  New    Target Date  06/02/19        PT Long Term Goals - 04/28/19 1827      PT LONG TERM GOAL #1   Title  Pt will improve R shoulder flexion to 160 degrees within 4 weeks in order to address functional mobility deficits and decrease risk for injury.    Baseline  R shoulder flexion 149    Time  4    Period  Weeks    Status  New    Target Date  06/02/19      PT LONG TERM GOAL #2   Title  Pt will receive an appropriate compression garment within 4 weeks in order to address RUE stage 1 lymphedema and prevent progression.    Baseline  Pt has a compression sleeve that does not fit properly and is over 6 months old.    Time  4    Period  Weeks    Status  New    Target Date  06/02/19      PT LONG TERM GOAL #3   Title  Pt will be independent in self MLD for long term management of right UE within 4 weeks to decrease risk for infection.    Baseline  pt currently states she does not perform MLD    Time  4    Period  Weeks    Status  New    Target Date  06/02/19             Plan - 04/28/19 1821    Clinical Impression Statement  Pt presents to physical therapy with right breast cancer and BRCA1 status. Initially pt underwent right mastectomy and then prophylactic left mastectomy with autologous D IEP flap reconstruction. She is now presenting with stage I lymphedema as demonstrated by edema that appears and then resolves. No pitting edema is  present at this time. She has slight decrease in R shoulder mobility compared to the L and will benefit from skilled physical therapy services to address R shoulder mobility and management of lymphedema 2x/week for 4 weeks.    Personal Factors and Comorbidities  Comorbidity 1    Comorbidities  Mastecomy w/reconstruction    Stability/Clinical Decision Making  Stable/Uncomplicated    Clinical Decision Making  Low    Rehab Potential  Excellent    PT Frequency  2x / week    PT Duration  4 weeks    PT Treatment/Interventions  Electrical Stimulation;DME Instruction;Functional mobility training;Therapeutic activities;Therapeutic exercise;Neuromuscular re-education;Patient/family education;Manual techniques;Manual lymph drainage;Compression bandaging;Passive  range of motion;Taping;Vasopneumatic Device    PT Next Visit Plan  Assist pt with arm sleeve and pump. Assess HEP    PT Home Exercise Plan  Access Code: [email or web address]    Consulted and Agree with Plan of Care  Patient       Patient will benefit from skilled therapeutic intervention in order to improve the following deficits and impairments:  Decreased range of motion, Decreased knowledge of precautions, Improper body mechanics  Visit Diagnosis: Malignant neoplasm of lower-outer quadrant of right breast of female, estrogen receptor positive ([facility])  Postmastectomy lymphedema  Abnormal posture  Stiffness of right shoulder, not elsewhere classified     Problem List Patient Active Problem List   Diagnosis Date Noted  . Neutropenic fever ([place]) 07/06/2017  . Normocytic anemia 07/06/2017  . Febrile neutropenia ([address]) 05/14/2017  . Malignant neoplasm of lower-outer quadrant of right breast of female, estrogen receptor positive ([place]) 03/17/2017  . Postpartum care following vaginal delivery (8/3) 03/15/2016  . Pregnancy 03/14/2016  . Normal labor 03/14/2016  . Rh negative status during pregnancy 03/14/2016    Ander Purpura, PT 04/28/2019, 6:35  PM  [place], [place], [address] Phone: [phone]   Fax:  [phone]  Name: TWILIA YAKLIN MRN: [id number] Date of Birth: [birth year]

## 2019-04-29 ENCOUNTER — Ambulatory Visit: Payer: BC Managed Care – PPO

## 2019-04-29 DIAGNOSIS — C50511 Malignant neoplasm of lower-outer quadrant of right female breast: Secondary | ICD-10-CM

## 2019-04-29 DIAGNOSIS — M25611 Stiffness of right shoulder, not elsewhere classified: Secondary | ICD-10-CM

## 2019-04-29 DIAGNOSIS — I972 Postmastectomy lymphedema syndrome: Secondary | ICD-10-CM

## 2019-04-29 DIAGNOSIS — M6281 Muscle weakness (generalized): Secondary | ICD-10-CM

## 2019-04-29 DIAGNOSIS — R293 Abnormal posture: Secondary | ICD-10-CM

## 2019-04-29 NOTE — Therapy (Signed)
[place], [place], [address] Phone: [phone]   Fax:  [phone]  Physical Therapy Treatment  Patient Details  Name: Ana Wu MRN: [id number] Date of Birth: 1981/04/18 No data recorded  Encounter Date: 04/29/2019  PT End of Session - 04/29/19 0851    Visit Number  2    Number of Visits  9    Date for PT Re-Evaluation  06/02/19    PT Start Time  0803    PT Stop Time  0850    PT Time Calculation (min)  47 min    Activity Tolerance  Patient tolerated treatment well    Behavior During Therapy  [facility] for tasks assessed/performed       Past Medical History:  Diagnosis Date  . Cancer ([place]) 02/2017   Breast Right     Past Surgical History:  Procedure Laterality Date  . BREAST FIBROADENOMA SURGERY     2001, 2007  . PORTACATH PLACEMENT Left 03/27/2017   Procedure: INSERTION PORT-A-CATH;  Surgeon: Stark Klein, MD;  Location: [place];  Service: General;  Laterality: Left;  . WISDOM TOOTH EXTRACTION      There were no vitals filed for this visit.  Subjective Assessment - 04/29/19 0808    Subjective  Nothing new since evaluation yesterday.    Pertinent History  July 31st, 2020 laparascopic total hysterectomy and Rt mastectomy with total reconstruction on 09/14/2017, then Lt mastectomy with totaly reconstruction surgery on 08/03/2018, both were DIEP flaps. She took chemo therapy and finished in April of 2019.    Patient Stated Goals  control of Rt arm swelling for my Rt arm and imrpove my shoulder motion    Currently in Pain?  No/denies                  Outpatient Re[facility]  Lymphedema Life Impact Scale Total Score  13.24 %           [facility] Adult PT Treatment/Exercise - 04/29/19 0001      Shoulder Exercises: Pulleys   Flexion  1 minute   pt with minimal stretch felt   ABduction  2 minutes    ABduction Limitations  pt  returning therapist demo       Shoulder Exercises: Therapy Ball   Flexion  Both;10 reps   forward lean into end of stretch   ABduction  Right;5 reps   same side lean into end of stretch   ABduction Limitations  returning hterapist demo for each      Manual Therapy   Manual Therapy  Manual Lymphatic Drainage (MLD);Passive ROM    Manual Lymphatic Drainage (MLD)  In Supine: Short neck, superficial and deep abdominals, Rt inguinal and Lt axillary nodes, Rt axillo-inguinal and anterior inter-axillary anastomosis then focused on Rt UE from lateral upper arm to dorsum of hand working from proximal to distal then retracing all steps beginning to instruct pt throughout    Passive ROM  In Supine into flexion, abduction and D2 briefly at end of session as time allowed, pt very minimally limited with all motions               PT Short Term Goals - 04/28/19 1826      PT SHORT TERM GOAL #1   Title  Pt will demonstrate proper mechanics and techniques w/current HEP in order to adequately address R shoulder stiffness.    Baseline  Pt has no HEP  Time  2    Period  Weeks    Status  New    Target Date  06/02/19        PT Long Term Goals - 04/28/19 1827      PT LONG TERM GOAL #1   Title  Pt will improve R shoulder flexion to 160 degrees within 4 weeks in order to address functional mobility deficits and decrease risk for injury.    Baseline  R shoulder flexion 149    Time  4    Period  Weeks    Status  New    Target Date  06/02/19      PT LONG TERM GOAL #2   Title  Pt will receive an appropriate compression garment within 4 weeks in order to address RUE stage 1 lymphedema and prevent progression.    Baseline  Pt has a compression sleeve that does not fit properly and is over 6 months old.    Time  4    Period  Weeks    Status  New    Target Date  06/02/19      PT LONG TERM GOAL #3   Title  Pt will be independent in self MLD for long term management of right UE within 4 weeks to  decrease risk for infection.    Baseline  pt currently states she does not perform MLD    Time  4    Period  Weeks    Status  New    Target Date  06/02/19            Plan - 04/29/19 [id number]    Clinical Impression Statement  Began manual lymph drainage today instructing pt in anatomy of lymphatic system and basic principles of MLD. She verbalized good understanding of all asking appropriate questions throughout. Also began AA/ROM exs which she tolerated very well. Minimal stretches felt with pulleys but reports good end ROM stretch with ball roll up wall.    Personal Factors and Comorbidities  Comorbidity 1    Comorbidities  Bil mastecomy w/reconstruction    Stability/Clinical Decision Making  Stable/Uncomplicated    Rehab Potential  Excellent    PT Frequency  2x / week    PT Duration  4 weeks    PT Treatment/Interventions  Electrical Stimulation;DME Instruction;Functional mobility training;Therapeutic activities;Therapeutic exercise;Neuromuscular re-education;Patient/family education;Manual techniques;Manual lymph drainage;Compression bandaging;Passive range of motion;Taping;Vasopneumatic Device    PT Next Visit Plan  Assist pt with arm sleeve and pump. Assess HEP. Instruct pt in supine scapular series and begin instructing her in self manual lymph drainage having her return demo and issue handout.    PT Home Exercise Plan  Access Code: [email or web address]    Consulted and Agree with Plan of Care  Patient       Patient will benefit from skilled therapeutic intervention in order to improve the following deficits and impairments:     Visit Diagnosis: Malignant neoplasm of lower-outer quadrant of right breast of female, estrogen receptor positive ([place])  Postmastectomy lymphedema  Abnormal posture  Stiffness of right shoulder, not elsewhere classified  Muscle weakness (generalized)     Problem List Patient Active Problem List   Diagnosis Date Noted  . Neutropenic fever ([place]) 07/06/2017   . Normocytic anemia 07/06/2017  . Febrile neutropenia ([place]) 05/14/2017  . Malignant neoplasm of lower-outer quadrant of right breast of female, estrogen receptor positive ([place]) 03/17/2017  . Postpartum care following vaginal delivery (8/3) 03/15/2016  . Pregnancy 03/14/2016  . Normal labor  03/14/2016  . Rh negative status during pregnancy 03/14/2016    Otelia Limes, PTA 04/29/2019, 8:59 AM  [place], [place], [address] Phone: [phone]   Fax:  [phone]  Name: Ana Wu MRN: [id number] Date of Birth: [birth year]

## 2019-05-03 ENCOUNTER — Other Ambulatory Visit: Payer: Self-pay

## 2019-05-03 ENCOUNTER — Ambulatory Visit: Payer: BC Managed Care – PPO

## 2019-05-03 DIAGNOSIS — M25611 Stiffness of right shoulder, not elsewhere classified: Secondary | ICD-10-CM

## 2019-05-03 DIAGNOSIS — R293 Abnormal posture: Secondary | ICD-10-CM

## 2019-05-03 DIAGNOSIS — I972 Postmastectomy lymphedema syndrome: Secondary | ICD-10-CM

## 2019-05-03 DIAGNOSIS — C50511 Malignant neoplasm of lower-outer quadrant of right female breast: Secondary | ICD-10-CM

## 2019-05-03 DIAGNOSIS — M6281 Muscle weakness (generalized): Secondary | ICD-10-CM

## 2019-05-03 IMAGING — DX DG CHEST 1V PORT
1 series · 1 of 1 positions shown · non-contrast
Comparison: 05/12/2017

CLINICAL DATA: Fever since 05/11/2017. Currently undergoing
chemotherapy for breast cancer.

EXAM:
PORTABLE CHEST 1 VIEW

[chest ap]
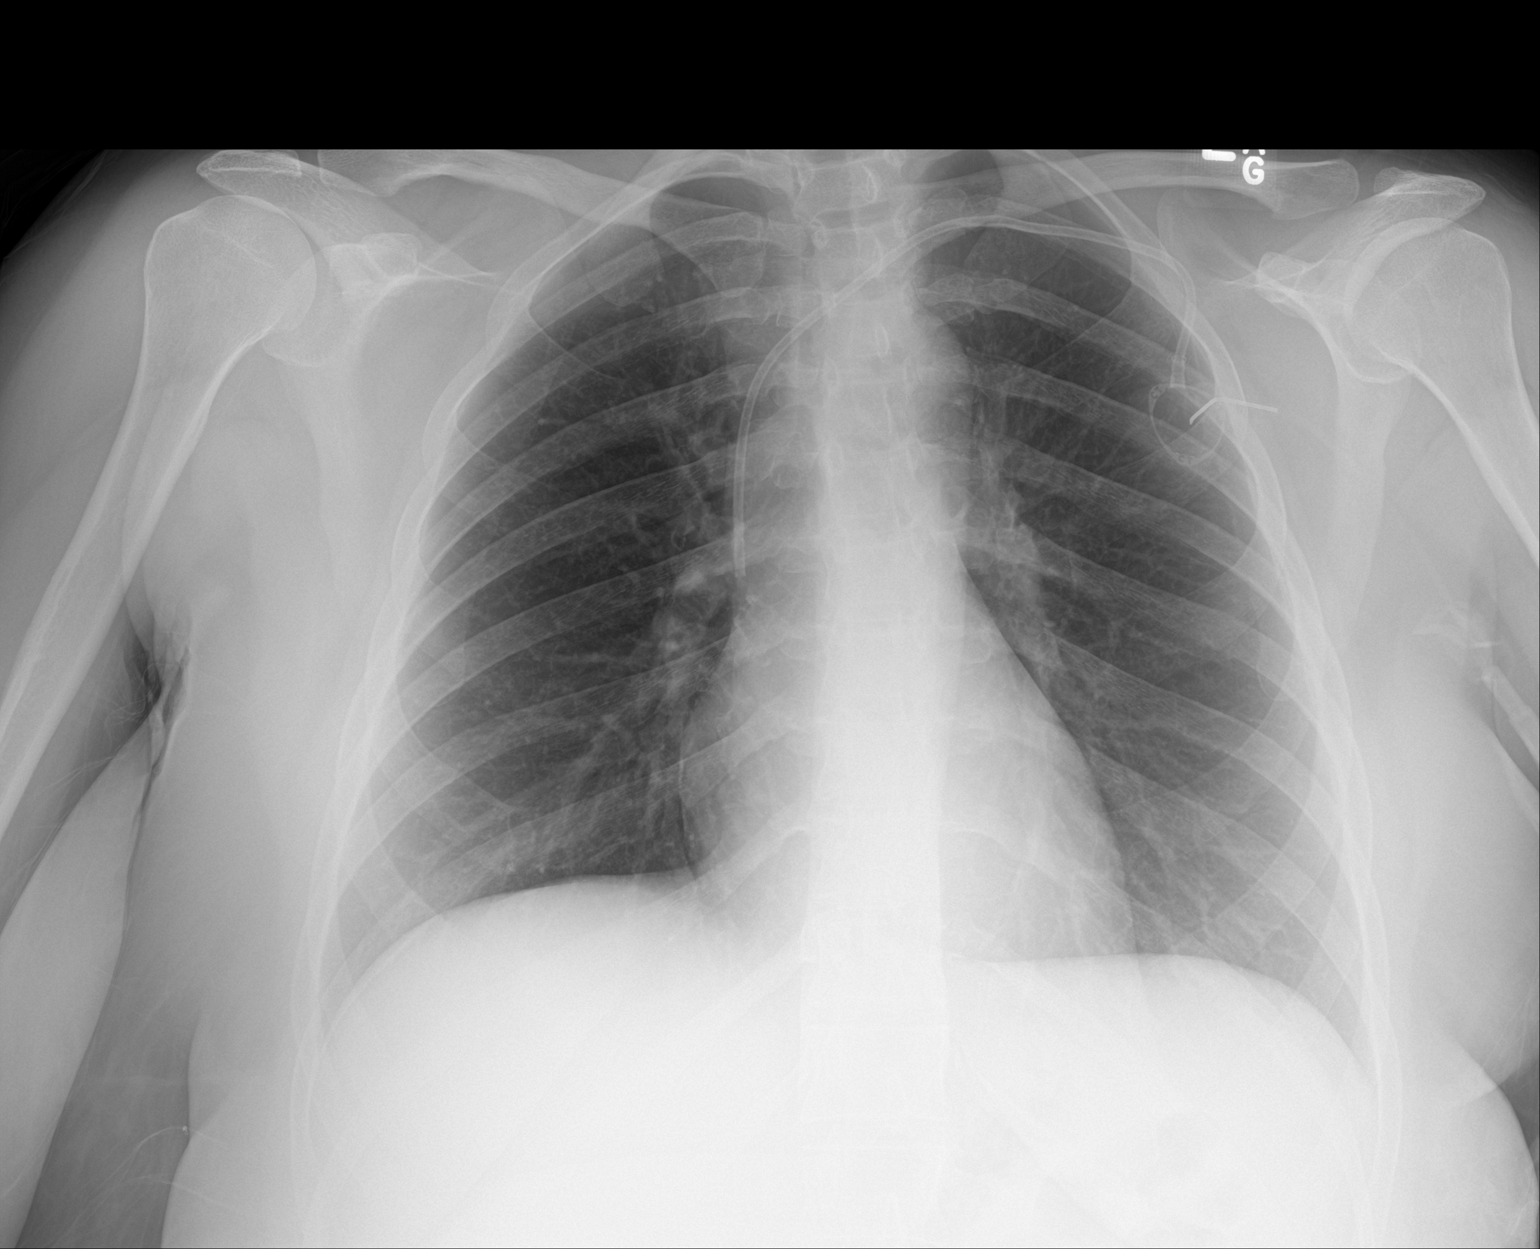

[1 of 1 positions shown; findings below may reference images not displayed]

FINDINGS: Power port type central venous catheter with tip over the low SVC
region. No pneumothorax. Normal heart size and pulmonary
vascularity. Lungs are clear. No airspace disease or consolidation.
No blunting of costophrenic angles.
IMPRESSION: No active disease.

## 2019-05-03 NOTE — Therapy (Signed)
[place], [place], [address] Phone: [phone]   Fax:  [phone]  Physical Therapy Treatment  Patient Details  Name: Ana Wu MRN: [id number] Date of Birth: Apr 23, 1981 No data recorded  Encounter Date: 05/03/2019  PT End of Session - 05/03/19 0857    Visit Number  3    Number of Visits  9    Date for PT Re-Evaluation  06/02/19    PT Start Time  0804    PT Stop Time  0851    PT Time Calculation (min)  47 min    Activity Tolerance  Patient tolerated treatment well    Behavior During Therapy  [facility] for tasks assessed/performed       Past Medical History:  Diagnosis Date  . Cancer ([place]) 02/2017   Breast Right     Past Surgical History:  Procedure Laterality Date  . BREAST FIBROADENOMA SURGERY     2001, 2007  . PORTACATH PLACEMENT Left 03/27/2017   Procedure: INSERTION PORT-A-CATH;  Surgeon: Stark Klein, MD;  Location: [place];  Service: General;  Laterality: Left;  . WISDOM TOOTH EXTRACTION      There were no vitals filed for this visit.  Subjective Assessment - 05/03/19 0809    Subjective  My Rt arm felt good after last session. I did a little yard work over the weekend with the nice weather and so my arm was a little achy last night but it is feeling better now.    Pertinent History  July 31st, 2020 laparascopic total hysterectomy and Rt mastectomy with total reconstruction on 09/14/2017, then Lt mastectomy with totaly reconstruction surgery on 08/03/2018, both were DIEP flaps. She took chemo therapy and finished in April of 2019.    Patient Stated Goals  control of Rt arm swelling for my Rt arm and imrpove my shoulder motion    Currently in Pain?  No/denies                  Outpatient Rehab from [facility]/28/2019 [facility]  Lymphedema Life Impact Scale Total Score  13.24 %           [facility] Adult PT Treatment/Exercise - 05/03/19 0001       Shoulder Exercises: Supine   Horizontal ABduction  Strengthening;Both;10 reps;Theraband    Theraband Level (Shoulder Horizontal ABduction)  Level 1 (Yellow)    External Rotation  Strengthening;Both;10 reps;Theraband    Theraband Level (Shoulder External Rotation)  Level 1 (Yellow)    Flexion  Strengthening;Both;5 reps;Theraband   Narrow and Wide, 5 times each way   Theraband Level (Shoulder Flexion)  Level 1 (Yellow)    Diagonals  Strengthening;Right;Left;10 reps;Theraband    Theraband Level (Shoulder Diagonals)  Level 1 (Yellow)    Diagonals Limitations  Pt returned therapist demo of all above exercises with min VCs       Manual Therapy   Manual Lymphatic Drainage (MLD)  In Supine: Short neck, superficial and deep abdominals, Rt inguinal and Lt axillary nodes, Rt axillo-inguinal and anterior inter-axillary anastomosis then focused on Rt UE from lateral upper arm to dorsum of hand working from proximal to distal then retracing all steps instructing pt throughout and having her return demo for each step    Passive ROM  --             PT Education - 05/03/19 0820    Education Details  Supien scapular series and self manual lymph drainage  Person(s) Educated  Patient    Methods  Explanation;Demonstration;Handout    Comprehension  Verbalized understanding;Returned demonstration;Need further instruction;Verbal cues required;Tactile cues required       PT Short Term Goals - 04/28/19 1826      PT SHORT TERM GOAL #1   Title  Pt will demonstrate proper mechanics and techniques w/current HEP in order to adequately address R shoulder stiffness.    Baseline  Pt has no HEP    Time  2    Period  Weeks    Status  New    Target Date  06/02/19        PT Long Term Goals - 04/28/19 1827      PT LONG TERM GOAL #1   Title  Pt will improve R shoulder flexion to 160 degrees within 4 weeks in order to address functional mobility deficits and decrease risk for injury.    Baseline  R  shoulder flexion 149    Time  4    Period  Weeks    Status  New    Target Date  06/02/19      PT LONG TERM GOAL #2   Title  Pt will receive an appropriate compression garment within 4 weeks in order to address RUE stage 1 lymphedema and prevent progression.    Baseline  Pt has a compression sleeve that does not fit properly and is over 6 months old.    Time  4    Period  Weeks    Status  New    Target Date  06/02/19      PT LONG TERM GOAL #3   Title  Pt will be independent in self MLD for long term management of right UE within 4 weeks to decrease risk for infection.    Baseline  pt currently states she does not perform MLD    Time  4    Period  Weeks    Status  New    Target Date  06/02/19            Plan - 05/03/19 0857    Clinical Impression Statement  Progressed pts HEP today. She tolerated supine scapular series very well and returned good demo. She also did very well with instruction of self manual lymph drainage returning good demo with correct pressure throughout. She also was able to verbalized a good understanding of the sequence by bein able to verbalize steps throughout when prompted.    Personal Factors and Comorbidities  Comorbidity 1    Comorbidities  Bil mastecomy w/reconstruction    Stability/Clinical Decision Making  Stable/Uncomplicated    Rehab Potential  Excellent    PT Frequency  2x / week    PT Duration  4 weeks    PT Treatment/Interventions  Electrical Stimulation;DME Instruction;Functional mobility training;Therapeutic activities;Therapeutic exercise;Neuromuscular re-education;Patient/family education;Manual techniques;Manual lymph drainage;Compression bandaging;Passive range of motion;Taping;Vasopneumatic Device    PT Next Visit Plan  Assist pt with arm sleeve and pump. Assess HEP. Review supine scapular series and self manual lymph drainage having her return demo. Remeasure circumference    Consulted and Agree with Plan of Care  Patient        Patient will benefit from skilled therapeutic intervention in order to improve the following deficits and impairments:  Decreased range of motion, Decreased knowledge of precautions, Improper body mechanics  Visit Diagnosis: Malignant neoplasm of lower-outer quadrant of right breast of female, estrogen receptor positive ([facility])  Postmastectomy lymphedema  Abnormal posture  Stiffness of right  shoulder, not elsewhere classified  Muscle weakness (generalized)     Problem List Patient Active Problem List   Diagnosis Date Noted  . Neutropenic fever ([place]) 07/06/2017  . Normocytic anemia 07/06/2017  . Febrile neutropenia ([place]) 05/14/2017  . Malignant neoplasm of lower-outer quadrant of right breast of female, estrogen receptor positive ([place]) 03/17/2017  . Postpartum care following vaginal delivery (8/3) 03/15/2016  . Pregnancy 03/14/2016  . Normal labor 03/14/2016  . Rh negative status during pregnancy 03/14/2016    Otelia Limes, PTA 05/03/2019, 9:01 AM  [place], [place], [address] Phone: [phone]   Fax:  [phone]  Name: Ana Wu MRN: [id number] Date of Birth: [birth year]

## 2019-05-03 NOTE — Patient Instructions (Signed)
  Deep Effective Breath   Standing, sitting, or laying down, place both hands on the belly. Take a deep breath IN, expanding the belly; then breath OUT, contracting the belly. Repeat __5__ times. Do __2-3__ sessions per day and before your self massage.  http://gt2.exer.us/866   Copyright  VHI. All rights reserved.  Axilla to Axilla - Sweep   On uninvolved side make 5 circles in the armpit, then pump _5__ times from involved armpit across chest to uninvolved armpit, making a pathway. Do _1__ time per day.  Copyright  VHI. All rights reserved.  Axilla to Inguinal Nodes - Sweep   On involved side, make 5 circles at groin at panty line, then pump _5__ times from armpit along side of trunk to outer hip, making your other pathway. Do __1_ time per day.  Copyright  VHI. All rights reserved.  Arm Posterior: Elbow to Shoulder - Sweep   Pump _5__ times from back of elbow to top of shoulder. Then inner to outer upper arm _5_ times, then outer arm again _5_ times. Then back to the pathways _2-3_ times. Do _1__ time per day.  Copyright  VHI. All rights reserved.  ARM: [place] Wrist to Elbow - Sweep   Pump or stationary circles _5__ times from wrist to elbow making sure to do both sides of the forearm. Then retrace your steps to the outer arm, and the pathways _2-3_ times each. Do _1__ time per day.  Copyright  VHI. All rights reserved.  ARM: Dorsum of Hand to Shoulder - Sweep   Pump or stationary circles _5__ times on back of hand including knuckle spaces and individual fingers if needed working up towards the wrist, then retrace all your steps working back up the forearm, doing both sides; upper outer arm and back to your pathways _2-3_ times each. Then do 5 circles again at uninvolved armpit and involved groin where you started! Good job!! Do __1_ time per day.         Over Head Pull: Narrow and Wide Grip   Cancer Rehab [phone]   On back, knees bent, feet flat, band  across thighs, elbows straight but relaxed. Pull hands apart (start). Keeping elbows straight, bring arms up and over head, hands toward floor. Keep pull steady on band. Hold momentarily. Return slowly, keeping pull steady, back to start. Then do same with a wider grip on the band (past shoulder width) Repeat _5-10__ times. Band color __yellow____   Side Pull: Double Arm   On back, knees bent, feet flat. Arms perpendicular to body, shoulder level, elbows straight but relaxed. Pull arms out to sides, elbows straight. Resistance band comes across collarbones, hands toward floor. Hold momentarily. Slowly return to starting position. Repeat _5-10__ times. Band color _yellow____   Sword   On back, knees bent, feet flat, left hand on left hip, right hand above left. Pull right arm DIAGONALLY (hip to shoulder) across chest. Bring right arm along head toward floor. Hold momentarily. Slowly return to starting position. Repeat _5-10__ times. Do with left arm. Band color _yellow_____   Shoulder Rotation: Double Arm   On back, knees bent, feet flat, elbows tucked at sides, bent 90, hands palms up. Pull hands apart and down toward floor, keeping elbows near sides. Hold momentarily. Slowly return to starting position. Repeat _5-10__ times. Band color __yellow____

## 2019-05-05 ENCOUNTER — Ambulatory Visit: Payer: BC Managed Care – PPO

## 2019-05-10 ENCOUNTER — Ambulatory Visit: Payer: BC Managed Care – PPO

## 2019-05-10 ENCOUNTER — Other Ambulatory Visit: Payer: Self-pay

## 2019-05-10 DIAGNOSIS — C50511 Malignant neoplasm of lower-outer quadrant of right female breast: Secondary | ICD-10-CM | POA: Diagnosis not present

## 2019-05-10 DIAGNOSIS — R293 Abnormal posture: Secondary | ICD-10-CM

## 2019-05-10 DIAGNOSIS — M25611 Stiffness of right shoulder, not elsewhere classified: Secondary | ICD-10-CM

## 2019-05-10 DIAGNOSIS — I972 Postmastectomy lymphedema syndrome: Secondary | ICD-10-CM

## 2019-05-10 DIAGNOSIS — M6281 Muscle weakness (generalized): Secondary | ICD-10-CM

## 2019-05-10 NOTE — Therapy (Signed)
[place], [place], [address] Phone: [phone]   Fax:  [phone]  Physical Therapy Treatment  Patient Details  Name: Ana Wu MRN: [id number] Date of Birth: 1981-03-01 No data recorded  Encounter Date: 05/10/2019  PT End of Session - 05/10/19 0905    Visit Number  4    Number of Visits  9    Date for PT Re-Evaluation  06/02/19    PT Start Time  0806    PT Stop Time  0846    PT Time Calculation (min)  40 min    Activity Tolerance  Patient tolerated treatment well    Behavior During Therapy  [facility] for tasks assessed/performed       Past Medical History:  Diagnosis Date  . Cancer ([place]) 02/2017   Breast Right     Past Surgical History:  Procedure Laterality Date  . BREAST FIBROADENOMA SURGERY     2001, 2007  . PORTACATH PLACEMENT Left 03/27/2017   Procedure: INSERTION PORT-A-CATH;  Surgeon: Stark Klein, MD;  Location: [place];  Service: General;  Laterality: Left;  . WISDOM TOOTH EXTRACTION      There were no vitals filed for this visit.  Subjective Assessment - 05/10/19 0807    Subjective  I went away with some friends to [place] and had a really good time. My Rt arm is doing well. I did the exercises you gave me last time this weekend and stretched alot, and I think that helped my arm feel good too.    Pertinent History  July 31st, 2020 laparascopic total hysterectomy and Rt mastectomy with total reconstruction on 09/14/2017, then Lt mastectomy with totaly reconstruction surgery on 08/03/2018, both were DIEP flaps. She took chemo therapy and finished in April of 2019.    Patient Stated Goals  control of Rt arm swelling for my Rt arm and imrpove my shoulder motion    Currently in Pain?  No/denies            LYMPHEDEMA/ONCOLOGY QUESTIONNAIRE - 05/10/19 0808      Right Upper Extremity Lymphedema   10 cm Proximal to Olecranon Process  33.5 cm    Olecranon Process  27.2 cm    15 cm  Proximal to Ulnar Styloid Process  27.4 cm    10 cm Proximal to Ulnar Styloid Process  24 cm    Just Proximal to Ulnar Styloid Process  15.9 cm    Across Hand at [employer]  18.1 cm    At [place] of 2nd Digit  5.7 cm           Outpatient Rehab from [facility]  Lymphedema Life Impact Scale Total Score  13.24 %           [facility] Adult PT Treatment/Exercise - 05/10/19 0001      Manual Therapy   Manual Lymphatic Drainage (MLD)  In Supine: Short neck, superficial and deep abdominals, Rt inguinal and Lt axillary nodes, Rt axillo-inguinal and anterior inter-axillary anastomosis then focused on Rt UE from lateral upper arm to dorsum of hand working from proximal to distal then retracing all steps instructing pt throughout and having her return demo for each step.                PT Short Term Goals - 04/28/19 1826      PT SHORT TERM GOAL #1   Title  Pt will demonstrate proper mechanics and techniques  w/current HEP in order to adequately address R shoulder stiffness.    Baseline  Pt has no HEP    Time  2    Period  Weeks    Status  New    Target Date  06/02/19        PT Long Term Goals - 04/28/19 1827      PT LONG TERM GOAL #1   Title  Pt will improve R shoulder flexion to 160 degrees within 4 weeks in order to address functional mobility deficits and decrease risk for injury.    Baseline  R shoulder flexion 149    Time  4    Period  Weeks    Status  New    Target Date  06/02/19      PT LONG TERM GOAL #2   Title  Pt will receive an appropriate compression garment within 4 weeks in order to address RUE stage 1 lymphedema and prevent progression.    Baseline  Pt has a compression sleeve that does not fit properly and is over 6 months old.    Time  4    Period  Weeks    Status  New    Target Date  06/02/19      PT LONG TERM GOAL #3   Title  Pt will be independent in self MLD for long term management of right UE  within 4 weeks to decrease risk for infection.    Baseline  pt currently states she does not perform MLD    Time  4    Period  Weeks    Status  New    Target Date  06/02/19            Plan - 05/10/19 0905    Clinical Impression Statement  Remeasured pts circumference and all her measurements were either reduced or unchanged (only one unchanged was 10 cm proximal from ulnar styloid). Pt is becoming independent with new HEP and decreasing minimal tightness at her end ROM. She was more fully instructed in self manual lymph drainage having her return the demo. She did excellent with using light pressure, minimal tactile and VCs initially to strech, not slide on skin, but was quickly returning correct demo of this as well. Further discussed her compression garment needs and pt would like to return to [place] for her new compression. Educated pt about difference in circular knit and flat knit and how pt will now benefit from flat knit as her lymphedema is becoming more progressive. She is agreeable to this so order for compression being sent to referring provider Cyndie Mull, NP) today.    Personal Factors and Comorbidities  Comorbidity 1    Comorbidities  Bil mastecomy w/reconstruction    Stability/Clinical Decision Making  Stable/Uncomplicated    Rehab Potential  Excellent    PT Frequency  2x / week    PT Duration  4 weeks    PT Treatment/Interventions  Electrical Stimulation;DME Instruction;Functional mobility training;Therapeutic activities;Therapeutic exercise;Neuromuscular re-education;Patient/family education;Manual techniques;Manual lymph drainage;Compression bandaging;Passive range of motion;Taping;Vasopneumatic Device    PT Next Visit Plan  Issue script if returned signed. Cont and review prn with pt manual lymph drainage of Rt UE; progress HEP prn, may consider adding bil UE 3 way raises. Remeasure Rt shoulder flexion and assess end P/ROM for tightness.    PT Home Exercise Plan   Access Code: [email or web address]; self manual lymph drainage    Consulted and Agree with Plan of Care  Patient  Patient will benefit from skilled therapeutic intervention in order to improve the following deficits and impairments:  Decreased range of motion, Decreased knowledge of precautions, Improper body mechanics  Visit Diagnosis: Malignant neoplasm of lower-outer quadrant of right breast of female, estrogen receptor positive ([facility])  Postmastectomy lymphedema  Abnormal posture  Stiffness of right shoulder, not elsewhere classified  Muscle weakness (generalized)     Problem List Patient Active Problem List   Diagnosis Date Noted  . Neutropenic fever ([place]) 07/06/2017  . Normocytic anemia 07/06/2017  . Febrile neutropenia ([place]) 05/14/2017  . Malignant neoplasm of lower-outer quadrant of right breast of female, estrogen receptor positive ([place]) 03/17/2017  . Postpartum care following vaginal delivery (8/3) 03/15/2016  . Pregnancy 03/14/2016  . Normal labor 03/14/2016  . Rh negative status during pregnancy 03/14/2016    Otelia Limes, PTA 05/10/2019, 9:21 AM  [place], [place], [address] Phone: [phone]   Fax:  [phone]  Name: Ana Wu MRN: [id number] Date of Birth: [birth year]

## 2019-05-12 ENCOUNTER — Ambulatory Visit: Payer: BC Managed Care – PPO

## 2019-05-12 ENCOUNTER — Other Ambulatory Visit: Payer: Self-pay

## 2019-05-12 DIAGNOSIS — M25611 Stiffness of right shoulder, not elsewhere classified: Secondary | ICD-10-CM

## 2019-05-12 DIAGNOSIS — Z170 Estrogen receptor positive status [ER+]: Secondary | ICD-10-CM

## 2019-05-12 DIAGNOSIS — M6281 Muscle weakness (generalized): Secondary | ICD-10-CM

## 2019-05-12 DIAGNOSIS — C50511 Malignant neoplasm of lower-outer quadrant of right female breast: Secondary | ICD-10-CM | POA: Diagnosis not present

## 2019-05-12 DIAGNOSIS — R293 Abnormal posture: Secondary | ICD-10-CM

## 2019-05-12 DIAGNOSIS — I972 Postmastectomy lymphedema syndrome: Secondary | ICD-10-CM

## 2019-05-12 NOTE — Patient Instructions (Signed)
Access Code: [email or web address]  URL: [email or web address]  Date: 05/12/2019  Prepared by: Tomma Rakers   Exercises Standing 'L' Stretch at Counter - 10 reps - 2 sets - 3 s hold - 1x daily - 7x weekly Standing Shoulder Flexion with Resistance - 10 reps - 1 sets - 1x daily - 3-4x weekly Doorway Pec Stretch at 90 Degrees Abduction - 5 reps - 2 sets - 10 s hold - 1x daily - 7x weekly Shoulder PNF D2 with Resistance - 10 reps - 1 sets - 1x daily - 3-4x weekly Standing Single Arm Shoulder Abduction with Resistance - 10 reps - 1 sets - 1x daily - 3-4x weekly

## 2019-05-12 NOTE — Therapy (Signed)
[place], [place], [address] Phone: [phone]   Fax:  [phone]  Physical Therapy Treatment  Patient Details  Name: Ana Wu MRN: [id number] Date of Birth: [birth year] No data recorded  Encounter Date: 05/12/2019  PT End of Session - 05/12/19 1616    Visit Number  5    Number of Visits  9    Date for PT Re-Evaluation  06/02/19    PT Start Time  1605    PT Stop Time  1700    PT Time Calculation (min)  55 min    Activity Tolerance  Patient tolerated treatment well    Behavior During Therapy  [facility] for tasks assessed/performed       Past Medical History:  Diagnosis Date  . Cancer ([place]) 02/2017   Breast Right     Past Surgical History:  Procedure Laterality Date  . BREAST FIBROADENOMA SURGERY     2001, 2007  . PORTACATH PLACEMENT Left 03/27/2017   Procedure: INSERTION PORT-A-CATH;  Surgeon: Stark Klein, MD;  Location: [place];  Service: General;  Laterality: Left;  . WISDOM TOOTH EXTRACTION      There were no vitals filed for this visit.  Subjective Assessment - 05/12/19 1606    Subjective  Pt reports that she is feeling pretty good. She has no questions about what she was given last week.She states she has been performing MLD at home.    Pertinent History  July 31st, 2020 laparascopic total hysterectomy and Rt mastectomy with total reconstruction on 09/14/2017, then Lt mastectomy with totaly reconstruction surgery on 08/03/2018, both were DIEP flaps. She took chemo therapy and finished in April of 2019.    Patient Stated Goals  control of Rt arm swelling for my Rt arm and imrpove my shoulder motion    Currently in Pain?  No/denies                  Outpatient Re[facility]  Lymphedema Life Impact Scale Total Score  13.24 %           [facility] Adult PT Treatment/Exercise - 05/12/19 0001      Shoulder Exercises:  Standing   Flexion  Strengthening;10 reps;Theraband    Theraband Level (Shoulder Flexion)  Level 1 (Yellow)    Flexion Limitations  VC for shoulder depression to decrease R shoulder hike    ABduction  Strengthening;10 reps;Theraband    Theraband Level (Shoulder ABduction)  Level 1 (Yellow)    ABduction Limitations  VC for palm down and only to 90 degrees to address deltoid weakness and avoid impingment.     Extension  Strengthening;10 reps    Theraband Level (Shoulder Extension)  Level 1 (Yellow)    Extension Limitations  2 sets, VC for scapular retraction for lower trapezius activation.    Retraction  Strengthening;10 reps    Theraband Level (Shoulder Retraction)  Level 1 (Yellow)    Retraction Limitations  2 sets, VC for shoulder depression to prevent increased activation of the upper trapezius.     Other Standing Exercises  Standing Y with theraband over head with VC for thumb to go in direction of Y to prevent shoulder impingment and improve shoulder stabilization.       Shoulder Exercises: Stretch   Wall Stretch - Flexion Limitations  10x 5 second hold VC for light stretching.     Wall Stretch - ABduction Limitations  10x 5 second hold VC  for light stretching      Manual Therapy   Manual Lymphatic Drainage (MLD)  In Supine: Short neck, superficial and deep abdominals, Rt inguinal and Lt axillary nodes, Rt axillo-inguinal and anterior inter-axillary anastomosis then focused on Rt UE from lateral upper arm to dorsum of hand working from proximal to distal then retracing all steps pt instructed for each step and this session focused on direction and pressure.              PT Education - 05/12/19 1652    Education Details  Access Code: [email or web address] and self manual lymph drainage    Person(s) Educated  Patient    Methods  Explanation;Demonstration;Handout;Verbal cues;Tactile cues    Comprehension  Verbalized understanding;Returned demonstration       PT Short Term Goals - 04/28/19  1826      PT SHORT TERM GOAL #1   Title  Pt will demonstrate proper mechanics and techniques w/current HEP in order to adequately address R shoulder stiffness.    Baseline  Pt has no HEP    Time  2    Period  Weeks    Status  New    Target Date  06/02/19        PT Long Term Goals - 04/28/19 1827      PT LONG TERM GOAL #1   Title  Pt will improve R shoulder flexion to 160 degrees within 4 weeks in order to address functional mobility deficits and decrease risk for injury.    Baseline  R shoulder flexion 149    Time  4    Period  Weeks    Status  New    Target Date  06/02/19      PT LONG TERM GOAL #2   Title  Pt will receive an appropriate compression garment within 4 weeks in order to address RUE stage 1 lymphedema and prevent progression.    Baseline  Pt has a compression sleeve that does not fit properly and is over 6 months old.    Time  4    Period  Weeks    Status  New    Target Date  06/02/19      PT LONG TERM GOAL #3   Title  Pt will be independent in self MLD for long term management of right UE within 4 weeks to decrease risk for infection.    Baseline  pt currently states she does not perform MLD    Time  4    Period  Weeks    Status  New    Target Date  06/02/19            Plan - 05/12/19 1607    Clinical Impression Statement  Pt was able to perform MLD following short demonstration this session well with only minimal VC/demonstration for direction and pressure. She was able to tolerate an increase in resistance ther-ex this session w/o an increase in pain from baseline. Pt HEP was updated for home. Pt requires occasional VC for correct form and to decrease R shoulder elevation during ther-ex to prevent compensation. Pt will benefit from continued POC.    Personal Factors and Comorbidities  Comorbidity 1    Comorbidities  Bil mastecomy w/reconstruction    Rehab Potential  Excellent    PT Frequency  2x / week    PT Duration  4 weeks    PT  Treatment/Interventions  Electrical Stimulation;DME Instruction;Functional mobility training;Therapeutic activities;Therapeutic exercise;Neuromuscular re-education;Patient/family education;Manual techniques;Manual lymph drainage;Compression bandaging;Passive  range of motion;Taping;Vasopneumatic Device    PT Next Visit Plan  Issue script if returned and signed not in as of 05/12/2019. Assess strength resistant HEP that was issued this session. Perform MLD.    PT Home Exercise Plan  Access Code: [email or web address]    Consulted and Agree with Plan of Care  Patient       Patient will benefit from skilled therapeutic intervention in order to improve the following deficits and impairments:  Decreased range of motion, Decreased knowledge of precautions, Improper body mechanics  Visit Diagnosis: Malignant neoplasm of lower-outer quadrant of right breast of female, estrogen receptor positive (HCC)  Postmastectomy lymphedema  Abnormal posture  Stiffness of right shoulder, not elsewhere classified  Muscle weakness (generalized)     Problem List Patient Active Problem List   Diagnosis Date Noted  . Neutropenic fever ([place]) 07/06/2017  . Normocytic anemia 07/06/2017  . Febrile neutropenia ([place]) 05/14/2017  . Malignant neoplasm of lower-outer quadrant of right breast of female, estrogen receptor positive ([place]) 03/17/2017  . Postpartum care following vaginal delivery (8/3) 03/15/2016  . Pregnancy 03/14/2016  . Normal labor 03/14/2016  . Rh negative status during pregnancy 03/14/2016    Gretel Acre Brixton Franko,PT 05/12/2019, 5:04 PM  [place], [place], [address] Phone: [phone]   Fax:  [phone]  Name: Ana Wu MRN: [id number] Date of Birth: 1981/03/23

## 2019-05-17 ENCOUNTER — Ambulatory Visit: Payer: BC Managed Care – PPO | Attending: Nurse Practitioner

## 2019-05-17 ENCOUNTER — Other Ambulatory Visit: Payer: Self-pay

## 2019-05-17 DIAGNOSIS — R293 Abnormal posture: Secondary | ICD-10-CM | POA: Diagnosis present

## 2019-05-17 DIAGNOSIS — C50511 Malignant neoplasm of lower-outer quadrant of right female breast: Secondary | ICD-10-CM | POA: Insufficient documentation

## 2019-05-17 DIAGNOSIS — I972 Postmastectomy lymphedema syndrome: Secondary | ICD-10-CM | POA: Diagnosis present

## 2019-05-17 DIAGNOSIS — Z170 Estrogen receptor positive status [ER+]: Secondary | ICD-10-CM | POA: Diagnosis present

## 2019-05-17 DIAGNOSIS — M6281 Muscle weakness (generalized): Secondary | ICD-10-CM | POA: Diagnosis present

## 2019-05-17 DIAGNOSIS — M25611 Stiffness of right shoulder, not elsewhere classified: Secondary | ICD-10-CM | POA: Insufficient documentation

## 2019-05-17 NOTE — Therapy (Signed)
[place], [place], [address] Phone: [phone]   Fax:  [phone]  Physical Therapy Treatment  Patient Details  Name: Ana Wu MRN: [id number] Date of Birth: 1980/09/18 No data recorded  Encounter Date: 05/17/2019  PT End of Session - 05/17/19 1655    Visit Number  6    Number of Visits  9    Date for PT Re-Evaluation  06/02/19    PT Start Time  [id number]    PT Stop Time  1650    PT Time Calculation (min)  45 min    Activity Tolerance  Patient tolerated treatment well    Behavior During Therapy  [facility] for tasks assessed/performed       Past Medical History:  Diagnosis Date  . Cancer ([place]) 02/2017   Breast Right     Past Surgical History:  Procedure Laterality Date  . BREAST FIBROADENOMA SURGERY     2001, 2007  . PORTACATH PLACEMENT Left 03/27/2017   Procedure: INSERTION PORT-A-CATH;  Surgeon: Stark Klein, MD;  Location: [place];  Service: General;  Laterality: Left;  . WISDOM TOOTH EXTRACTION      There were no vitals filed for this visit.  Subjective Assessment - 05/17/19 1614    Subjective  Doing well with self MLD but do need to review the new exercises she gave me last time.    Pertinent History  July 31st, 2020 laparascopic total hysterectomy and Rt mastectomy with total reconstruction on 09/14/2017, then Lt mastectomy with totaly reconstruction surgery on 08/03/2018, both were DIEP flaps. She took chemo therapy and finished in April of 2019.    Patient Stated Goals  control of Rt arm swelling for my Rt arm and imrpove my shoulder motion    Currently in Pain?  No/denies                  Outpatient Rehab from [facility]/28/2019 [facility]  Lymphedema Life Impact Scale Total Score  13.24 %           [facility] Adult PT Treatment/Exercise - 05/17/19 0001      Shoulder Exercises: Standing   Flexion  Strengthening;Right;12 reps;Theraband     Theraband Level (Shoulder Flexion)  Level 2 (Red)    Flexion Limitations  VC for shoulder depression to decrease R shoulder hike    ABduction  Strengthening;10 reps;Theraband    Theraband Level (Shoulder ABduction)  Level 2 (Red)    Diagonals  Strengthening;Right;10 reps;Theraband    Theraband Level (Shoulder Diagonals)  Level 2 (Red)    Diagonals Limitations  VCs for correct technique and demo      Manual Therapy   Manual Lymphatic Drainage (MLD)  In Supine: Short neck, superficial and deep abdominals, Rt inguinal and Lt axillary nodes, Rt axillo-inguinal and anterior inter-axillary anastomosis then focused on Rt UE from lateral upper arm to dorsum of hand working from proximal to distal then retracing all steps.             PT Education - 05/17/19 1702    Education Details  Reviewed HEP issued at last session and answered pts questions regarding how she can work stretching into her day as she is noticing her Rt upper quadrant getting tighter as she has been sitting at computer more, so reviewed counter stretch and how to do this for flexion and abduction and also doorway stretch can be done in office.    Person(s) Educated  Patient  Methods  Explanation;Demonstration    Comprehension  Verbalized understanding;Returned demonstration       PT Short Term Goals - 04/28/19 1826      PT SHORT TERM GOAL #1   Title  Pt will demonstrate proper mechanics and techniques w/current HEP in order to adequately address R shoulder stiffness.    Baseline  Pt has no HEP    Time  2    Period  Weeks    Status  New    Target Date  06/02/19        PT Long Term Goals - 04/28/19 1827      PT LONG TERM GOAL #1   Title  Pt will improve R shoulder flexion to 160 degrees within 4 weeks in order to address functional mobility deficits and decrease risk for injury.    Baseline  R shoulder flexion 149    Time  4    Period  Weeks    Status  New    Target Date  06/02/19      PT LONG TERM GOAL #2    Title  Pt will receive an appropriate compression garment within 4 weeks in order to address RUE stage 1 lymphedema and prevent progression.    Baseline  Pt has a compression sleeve that does not fit properly and is over 6 months old.    Time  4    Period  Weeks    Status  New    Target Date  06/02/19      PT LONG TERM GOAL #3   Title  Pt will be independent in self MLD for long term management of right UE within 4 weeks to decrease risk for infection.    Baseline  pt currently states she does not perform MLD    Time  4    Period  Weeks    Status  New    Target Date  06/02/19            Plan - 05/17/19 1610    Clinical Impression Statement  Reviewed HEP issued at last session and pt did well with this requiring only minor VCs and demo for correct technique. Continued with manual lymph drainage to Rt UE, good reductions are noted visibly over past few weeks. As soon as order returned signed from doctor (this has not come yet so requested pt to call doctors office for follow up requesting it get signed) pt will go to [facility] for new compression garments.    Personal Factors and Comorbidities  Comorbidity 1    Comorbidities  Bil mastecomy w/reconstruction    Stability/Clinical Decision Making  Stable/Uncomplicated    Rehab Potential  Excellent    PT Frequency  2x / week    PT Duration  4 weeks    PT Treatment/Interventions  Electrical Stimulation;DME Instruction;Functional mobility training;Therapeutic activities;Therapeutic exercise;Neuromuscular re-education;Patient/family education;Manual techniques;Manual lymph drainage;Compression bandaging;Passive range of motion;Taping;Vasopneumatic Device    PT Next Visit Plan  Issue script if returned and signed not in as of 05/17/2019 (pt is following up with doctor). Progress HEP for Rt UE strength prn and cont  MLD assessing technique prn.    PT Home Exercise Plan  Access Code: [email or web address]    Consulted and Agree with Plan of Care   Patient       Patient will benefit from skilled therapeutic intervention in order to improve the following deficits and impairments:  Decreased range of motion, Decreased knowledge of precautions, Improper body mechanics  Visit Diagnosis: Malignant neoplasm of lower-outer quadrant of right breast of female, estrogen receptor positive ([place])  Postmastectomy lymphedema  Abnormal posture  Stiffness of right shoulder, not elsewhere classified  Muscle weakness (generalized)     Problem List Patient Active Problem List   Diagnosis Date Noted  . Neutropenic fever ([place]) 07/06/2017  . Normocytic anemia 07/06/2017  . Febrile neutropenia ([place]) 05/14/2017  . Malignant neoplasm of lower-outer quadrant of right breast of female, estrogen receptor positive ([place]) 03/17/2017  . Postpartum care following vaginal delivery (8/3) 03/15/2016  . Pregnancy 03/14/2016  . Normal labor 03/14/2016  . Rh negative status during pregnancy 03/14/2016    Otelia Limes, PTA 05/17/2019, 5:04 PM  [place], [place], [address] Phone: [phone]   Fax:  [phone]  Name: Ana Wu MRN: [id number] Date of Birth: 1981-07-17

## 2019-05-19 ENCOUNTER — Other Ambulatory Visit: Payer: Self-pay

## 2019-05-19 ENCOUNTER — Ambulatory Visit: Payer: BC Managed Care – PPO

## 2019-05-19 DIAGNOSIS — C50511 Malignant neoplasm of lower-outer quadrant of right female breast: Secondary | ICD-10-CM

## 2019-05-19 DIAGNOSIS — I972 Postmastectomy lymphedema syndrome: Secondary | ICD-10-CM

## 2019-05-19 DIAGNOSIS — Z170 Estrogen receptor positive status [ER+]: Secondary | ICD-10-CM

## 2019-05-19 DIAGNOSIS — M6281 Muscle weakness (generalized): Secondary | ICD-10-CM

## 2019-05-19 DIAGNOSIS — M25611 Stiffness of right shoulder, not elsewhere classified: Secondary | ICD-10-CM

## 2019-05-19 DIAGNOSIS — R293 Abnormal posture: Secondary | ICD-10-CM

## 2019-05-19 NOTE — Therapy (Signed)
[place], [place], [address] Phone: [phone]   Fax:  [phone]  Physical Therapy Treatment  Patient Details  Name: Ana Wu MRN: [id number] Date of Birth: 07-20-1981 No data recorded  Encounter Date: 05/19/2019  PT End of Session - 05/19/19 1613    Visit Number  7    Number of Visits  9    Date for PT Re-Evaluation  06/02/19    PT Start Time  1600    PT Stop Time  1700    PT Time Calculation (min)  60 min    Activity Tolerance  Patient tolerated treatment well    Behavior During Therapy  [facility] for tasks assessed/performed       Past Medical History:  Diagnosis Date  . Cancer ([place]) 02/2017   Breast Right     Past Surgical History:  Procedure Laterality Date  . BREAST FIBROADENOMA SURGERY     2001, 2007  . PORTACATH PLACEMENT Left 03/27/2017   Procedure: INSERTION PORT-A-CATH;  Surgeon: Stark Klein, MD;  Location: [place];  Service: General;  Laterality: Left;  . WISDOM TOOTH EXTRACTION      There were no vitals filed for this visit.  Subjective Assessment - 05/19/19 1604    Subjective  Pt states that self MLD is going well and she has no questions about the exercises. She states that on Monday she tried the exercises with the red band and felt good with out any increase in muscle soreness. Pt states that she contacted her MD through my chart but has not checked to see if that was read yet.    Pertinent History  July 31st, 2020 laparascopic total hysterectomy and Rt mastectomy with total reconstruction on 09/14/2017, then Lt mastectomy with totaly reconstruction surgery on 08/03/2018, both were DIEP flaps. She took chemo therapy and finished in April of 2019.    Patient Stated Goals  control of Rt arm swelling for my Rt arm and imrpove my shoulder motion    Currently in Pain?  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, BRCA 1 positive    Hand Dominance  Right        LYMPHEDEMA/ONCOLOGY QUESTIONNAIRE - 05/19/19 1615      Right Upper Extremity Lymphedema   15 cm Proximal to Olecranon Process  36 cm    10 cm Proximal to Olecranon Process  35 cm    Olecranon Process  28.2 cm    15 cm Proximal to Ulnar Styloid Process  28 cm    10 cm Proximal to Ulnar Styloid Process  23.8 cm    Just Proximal to Ulnar Styloid Process  16.8 cm    Across Hand at [employer]  19.3 cm    At [place] of 2nd Digit  5.8 cm           Outpatient Re[facility] from [facility]28/[facility]  Lymphedema Life Impact Scale Total Score  13.24 %           [facility] Adult PT Treatment/Exercise - 05/19/19 0001      Exercises   Exercises  Shoulder      Shoulder Exercises: Seated   Other Seated Exercises  Seated postural exercises w/band crossed at knees with double ER and focus on scapular  squeezing down/back red theraband 10x       Shoulder Exercises: Standing   Flexion  Strengthening;Right;12 reps;Theraband    Theraband Level (Shoulder Flexion)  Level 2 (Red)    Flexion Limitations  VC for shoulder depression to decrease R shoulder hike    ABduction  Strengthening;10 reps;Theraband    Theraband Level (Shoulder ABduction)  Level 2 (Red)    ABduction Limitations  VC for correct movement into abduction.     Row  Strengthening;10 reps    Theraband Level (Shoulder Row)  Level 2 (Red)    Row Limitations  VC to depress shoulders in order to decrease compensation with upper trapezius and to adequately squeeze scapula for middle/lower trap recruitment.     Diagonals  Strengthening;Right;10 reps;Theraband    Theraband Level (Shoulder Diagonals)  Level 2 (Red)    Diagonals Limitations  Demonstration for correct form    Other Standing Exercises  Standing Y with theraband over head with VC for thumb to go in direction of Y to  prevent shoulder impingment and improve shoulder stabilization w/red theraband 10x      Manual Therapy   Manual Therapy  Manual Lymphatic Drainage (MLD)    Manual Lymphatic Drainage (MLD)  In supine short neck, superficial and deep abdominals. B inguinal and axillary lymph nodes, Rt axillo-inguinal and anterior inter-axillary anastomosis then focus on RUE from lateral upper arm to dorsum of hand working from proximal to distal then retracing all steps with instructions throughout including education on possible disruption of lymphatic flow from deep flap incision.              PT Education - 05/19/19 1714    Education Details  Pt will watch her R axillo-inguinal anastomsis for any swelling due to deep incision related to deep flap reconsruction surgery. Pt will continue with MLD and HEP at home.Discussed the use of vasopneumatic pump and compression.    Person(s) Educated  Patient    Methods  Explanation    Comprehension  Verbalized understanding       PT Short Term Goals - 04/28/19 1826      PT SHORT TERM GOAL #1   Title  Pt will demonstrate proper mechanics and techniques w/current HEP in order to adequately address R shoulder stiffness.    Baseline  Pt has no HEP    Time  2    Period  Weeks    Status  New    Target Date  06/02/19        PT Long Term Goals - 04/28/19 1827      PT LONG TERM GOAL #1   Title  Pt will improve R shoulder flexion to 160 degrees within 4 weeks in order to address functional mobility deficits and decrease risk for injury.    Baseline  R shoulder flexion 149    Time  4    Period  Weeks    Status  New    Target Date  06/02/19      PT LONG TERM GOAL #2   Title  Pt will receive an appropriate compression garment within 4 weeks in order to address RUE stage 1 lymphedema and prevent progression.    Baseline  Pt has a compression sleeve that does not fit properly and is over 6 months old.    Time  4    Period  Weeks    Status  New    Target Date   06/02/19      PT LONG TERM  GOAL #3   Title  Pt will be independent in self MLD for long term management of right UE within 4 weeks to decrease risk for infection.    Baseline  pt currently states she does not perform MLD    Time  4    Period  Weeks    Status  New    Target Date  06/02/19            Plan - 05/19/19 1603    Clinical Impression Statement  Pt presents today with slight increaes in RUE measurements since her initial evaluation. Discussed contacting her MD to get script signed due to increased swelling in order to decrease risk for progression of lymphedema. Derek wtih flexitouch was contacted for a home pump due to pt will benefit from a flexitouch pump. Pt continues to progress with scapular/shoulder stabilization ther-ex w/o an increase in pain from baseline. MLD was performed on the anterior trunk and RUE with discussion on possible disruption of this pathway due to deep flap incision; possible spouse education on posterior MLD to re-route fluid due to increase in RUE fluid. Pt will keep an eye on that area for any increased edema. Pt will contact her MD for script for compression garment and continue with activities at home including HEP and MLD. Pt will benefit from continued physical therapy POC.    Personal Factors and Comorbidities  Comorbidity 1    Comorbidities  Bil mastecomy w/reconstruction    PT Frequency  2x / week    PT Duration  4 weeks    PT Treatment/Interventions  Electrical Stimulation;DME Instruction;Functional mobility training;Therapeutic activities;Therapeutic exercise;Neuromuscular re-education;Patient/family education;Manual techniques;Manual lymph drainage;Compression bandaging;Passive range of motion;Taping;Vasopneumatic Device    PT Next Visit Plan  Issue script if MD has signed. If not re-fax script to MD. Ask pt if she has been contacted by [employer]. Continue to progress HEP for RUE strength and perform MLD.    PT Home Exercise Plan  Access Code:  [email or web address]    Recommended Other Services  Derek spaulding from [place] was notified.    Consulted and Agree with Plan of Care  Patient       Patient will benefit from skilled therapeutic intervention in order to improve the following deficits and impairments:  Decreased range of motion, Decreased knowledge of precautions, Improper body mechanics  Visit Diagnosis: Malignant neoplasm of lower-outer quadrant of right breast of female, estrogen receptor positive (HCC)  Postmastectomy lymphedema  Abnormal posture  Stiffness of right shoulder, not elsewhere classified  Muscle weakness (generalized)     Problem List Patient Active Problem List   Diagnosis Date Noted  . Neutropenic fever ([place]) 07/06/2017  . Normocytic anemia 07/06/2017  . Febrile neutropenia ([place]) 05/14/2017  . Malignant neoplasm of lower-outer quadrant of right breast of female, estrogen receptor positive ([place]) 03/17/2017  . Postpartum care following vaginal delivery (8/3) 03/15/2016  . Pregnancy 03/14/2016  . Normal labor 03/14/2016  . Rh negative status during pregnancy 03/14/2016    Ander Purpura, PT 05/19/2019, 5:16 PM  [place], [place], [address] Phone: [phone]   Fax:  [phone]  Name: Ana Wu MRN: [id number] Date of Birth: [birth year]

## 2019-05-24 ENCOUNTER — Other Ambulatory Visit: Payer: Self-pay

## 2019-05-24 ENCOUNTER — Ambulatory Visit: Payer: BC Managed Care – PPO

## 2019-05-24 DIAGNOSIS — M25611 Stiffness of right shoulder, not elsewhere classified: Secondary | ICD-10-CM

## 2019-05-24 DIAGNOSIS — C50511 Malignant neoplasm of lower-outer quadrant of right female breast: Secondary | ICD-10-CM

## 2019-05-24 DIAGNOSIS — M6281 Muscle weakness (generalized): Secondary | ICD-10-CM

## 2019-05-24 DIAGNOSIS — R293 Abnormal posture: Secondary | ICD-10-CM

## 2019-05-24 DIAGNOSIS — I972 Postmastectomy lymphedema syndrome: Secondary | ICD-10-CM

## 2019-05-24 NOTE — Therapy (Signed)
[place], [place], [address] Phone: [phone]   Fax:  [phone]  Physical Therapy Treatment  Patient Details  Name: Ana Wu MRN: [id number] Date of Birth: 1981/04/20 No data recorded  Encounter Date: 05/24/2019  PT End of Session - 05/24/19 1611    Visit Number  8    Number of Visits  9    Date for PT Re-Evaluation  06/02/19    PT Start Time  1400    PT Stop Time  1455    PT Time Calculation (min)  55 min    Activity Tolerance  Patient tolerated treatment well    Behavior During Therapy  [facility] for tasks assessed/performed       Past Medical History:  Diagnosis Date  . Cancer ([place]) 02/2017   Breast Right     Past Surgical History:  Procedure Laterality Date  . BREAST FIBROADENOMA SURGERY     2001, 2007  . PORTACATH PLACEMENT Left 03/27/2017   Procedure: INSERTION PORT-A-CATH;  Surgeon: Stark Klein, MD;  Location: [facility];  Service: General;  Laterality: Left;  . WISDOM TOOTH EXTRACTION      There were no vitals filed for this visit.  Subjective Assessment - 05/24/19 1603    Subjective  Pt reports that she called the MD and states that she is still trying to get the signed script. (It was re-sent this morning but we have yet to receive it back). Pt would like to take script to MD (pt was provided with copy to get signed)    Pertinent History  July 31st, 2020 laparascopic total hysterectomy and Rt mastectomy with total reconstruction on 09/14/2017, then Lt mastectomy with totaly reconstruction surgery on 08/03/2018, both were DIEP flaps. She took chemo therapy and finished in April of 2019.    Patient Stated Goals  control of Rt arm swelling for my Rt arm and imrpove my shoulder motion    Currently in Pain?  No/denies                  Outpatient Re[facility] from [facility]  Lymphedema Life Impact Scale Total Score  13.24 %            [facility] Adult PT Treatment/Exercise - 05/24/19 0001      Manual Therapy   Manual Therapy  Manual Lymphatic Drainage (MLD)    Manual Lymphatic Drainage (MLD)  Pt performed full sequence for the RUE independently w/ good form, skin stretch and direction. physical therapist performed full anterior trunk sequence followed by RUE with Bil axillary and inguinal lymph nodes, anterior/posterior inter-axillary anastomosis to the L, R axillary-inguinal anastomosis and posterior inter-inguinal anastomosis in order to give fluid more areas to re-route due to scarring on the abdomen. Full RUE sequence performed starting medial > lateral brachium then proximal to distal to the dorsum of the hand. Re-worked all surfaces followed by deep breathing.              PT Education - 05/24/19 1709    Education Details  Discussed POC in detail including if her measurements are up we will need to perform multi layer wrapping to get her into a garment. She will be seen less frequently but will be followed until she has a garment that is fitted correctly and she is able to wear on a daily basis to decrease risk of re-occurance.    Person(s) Educated  Patient    Methods  Explanation    Comprehension  Verbalized understanding       PT Short Term Goals - 04/28/19 1826      PT SHORT TERM GOAL #1   Title  Pt will demonstrate proper mechanics and techniques w/current HEP in order to adequately address R shoulder stiffness.    Baseline  Pt has no HEP    Time  2    Period  Weeks    Status  New    Target Date  06/02/19        PT Long Term Goals - 04/28/19 1827      PT LONG TERM GOAL #1   Title  Pt will improve R shoulder flexion to 160 degrees within 4 weeks in order to address functional mobility deficits and decrease risk for injury.    Baseline  R shoulder flexion 149    Time  4    Period  Weeks    Status  New    Target Date  06/02/19      PT LONG TERM GOAL #2   Title  Pt will receive an  appropriate compression garment within 4 weeks in order to address RUE stage 1 lymphedema and prevent progression.    Baseline  Pt has a compression sleeve that does not fit properly and is over 6 months old.    Time  4    Period  Weeks    Status  New    Target Date  06/02/19      PT LONG TERM GOAL #3   Title  Pt will be independent in self MLD for long term management of right UE within 4 weeks to decrease risk for infection.    Baseline  pt currently states she does not perform MLD    Time  4    Period  Weeks    Status  New    Target Date  06/02/19            Plan - 05/24/19 1705    Clinical Impression Statement  Pt present today with visible decrease in edema in the RUE including visible tendons on the R hand with extension there were not present last session. Pt reports that she has been performing self-MLD at [facility] and wearing her circular knit garment. She was able to perform self MLD from memory with good direction and skin stretch with physical therapist over site. Full anterior trunk and RUE MLD sequence was performed this session and then fluid was routed around posteriorly due to anterior scarring from previous surgery for improved lymphatic flow. Discussed the importance of continueing with self MLD and wearing compression. Re-iterated education on flat knit vs circular knit garments and discussed POC. Pt will benefit from continued POC.    Personal Factors and Comorbidities  Comorbidity 1    Comorbidities  Bil mastecomy w/reconstruction    PT Frequency  2x / week    PT Duration  4 weeks    PT Treatment/Interventions  Electrical Stimulation;DME Instruction;Functional mobility training;Therapeutic activities;Therapeutic exercise;Neuromuscular re-education;Patient/family education;Manual techniques;Manual lymph drainage;Compression bandaging;Passive range of motion;Taping;Vasopneumatic Device    PT Next Visit Plan  Measurements, see about sleeve, if she does not have sleeve  1x/week until she has sleeve, if measurements are up pt agreed to wrapping but if measurements are the same or down we will continue with flexitouch, self MLD and compression garment.    PT Home Exercise Plan  Access Code: [email or web address]    Consulted and Agree with Plan of Care  Patient       Patient will benefit from skilled therapeutic intervention in order to improve the following deficits and impairments:  Decreased range of motion, Decreased knowledge of precautions, Improper body mechanics  Visit Diagnosis: Malignant neoplasm of lower-outer quadrant of right breast of female, estrogen receptor positive (HCC)  Postmastectomy lymphedema  Abnormal posture  Stiffness of right shoulder, not elsewhere classified  Muscle weakness (generalized)     Problem List Patient Active Problem List   Diagnosis Date Noted  . Neutropenic fever ([place]) 07/06/2017  . Normocytic anemia 07/06/2017  . Febrile neutropenia ([place]) 05/14/2017  . Malignant neoplasm of lower-outer quadrant of right breast of female, estrogen receptor positive ([place]) 03/17/2017  . Postpartum care following vaginal delivery (8/3) 03/15/2016  . Pregnancy 03/14/2016  . Normal labor 03/14/2016  . Rh negative status during pregnancy 03/14/2016    Ander Purpura, PT 05/24/2019, 5:10 PM  [place], [place], [address] Phone: [phone]   Fax:  [phone]  Name: KAIMANA LAYMANCE MRN: [id number] Date of Birth: Jun 27, 1981

## 2019-05-26 ENCOUNTER — Other Ambulatory Visit: Payer: Self-pay

## 2019-05-26 ENCOUNTER — Ambulatory Visit: Payer: BC Managed Care – PPO

## 2019-05-26 DIAGNOSIS — R293 Abnormal posture: Secondary | ICD-10-CM

## 2019-05-26 DIAGNOSIS — I972 Postmastectomy lymphedema syndrome: Secondary | ICD-10-CM

## 2019-05-26 DIAGNOSIS — C50511 Malignant neoplasm of lower-outer quadrant of right female breast: Secondary | ICD-10-CM | POA: Diagnosis not present

## 2019-05-26 DIAGNOSIS — M6281 Muscle weakness (generalized): Secondary | ICD-10-CM

## 2019-05-26 DIAGNOSIS — M25611 Stiffness of right shoulder, not elsewhere classified: Secondary | ICD-10-CM

## 2019-05-26 NOTE — Therapy (Signed)
[place], [place], [address] Phone: [phone]   Fax:  [phone]  Physical Therapy Treatment  Patient Details  Name: Ana Wu MRN: [id number] Date of Birth: 1981-07-25 No data recorded  Encounter Date: 05/26/2019  PT End of Session - 05/26/19 0858    Visit Number  9    Number of Visits  13    Date for PT Re-Evaluation  06/23/19    PT Start Time  0809    PT Stop Time  [id number]    PT Time Calculation (min)  46 min    Activity Tolerance  Patient tolerated treatment well    Behavior During Therapy  [facility] for tasks assessed/performed       Past Medical History:  Diagnosis Date  . Cancer ([place]) 02/2017   Breast Right     Past Surgical History:  Procedure Laterality Date  . BREAST FIBROADENOMA SURGERY     2001, 2007  . PORTACATH PLACEMENT Left 03/27/2017   Procedure: INSERTION PORT-A-CATH;  Surgeon: Stark Klein, MD;  Location: [facility];  Service: General;  Laterality: Left;  . WISDOM TOOTH EXTRACTION      There were no vitals filed for this visit.  Subjective Assessment - 05/26/19 0813    Subjective  My doctor messaged me back in my chart and said she has faxed my script, I asked her to send it to [place]. And I get the pump demo today.    Pertinent History  July 31st, 2020 laparascopic total hysterectomy and Rt mastectomy with total reconstruction on 09/14/2017, then Lt mastectomy with totaly reconstruction surgery on 08/03/2018, both were DIEP flaps. She took chemo therapy and finished in April of 2019.    Patient Stated Goals  control of Rt arm swelling for my Rt arm and imrpove my shoulder motion    Currently in Pain?  No/denies         [facility] PT Assessment - 05/26/19 0001      AROM   Right Shoulder Flexion  165 Degrees    Right Shoulder ABduction  176 Degrees    Right Shoulder Internal Rotation  70 Degrees    Right Shoulder External Rotation  100 Degrees         LYMPHEDEMA/ONCOLOGY QUESTIONNAIRE - 05/26/19 0814      Right Upper Extremity Lymphedema   15 cm Proximal to Olecranon Process  34.2 cm    10 cm Proximal to Olecranon Process  32.9 cm    Olecranon Process  27.2 cm    15 cm Proximal to Ulnar Styloid Process  27.7 cm    10 cm Proximal to Ulnar Styloid Process  24.5 cm    Just Proximal to Ulnar Styloid Process  15.9 cm    Across Hand at [employer]  18.3 cm    At [place] of 2nd Digit  5.6 cm           Outpatient Rehab from [facility]/28/[facility]  Lymphedema Life Impact Scale Total Score  13.24 %           [facility] Adult PT Treatment/Exercise - 05/26/19 0001      Shoulder Exercises: Standing   Other Standing Exercises  Bil UE 3 way raises with 1 lb returning therapist demo for flexion, scaptio, and abduction all to shoulder height 10 times each      Manual Therapy   Manual Lymphatic Drainage (MLD)  In Supine: Short neck, Lt axillary  and Rt inguinal lymph nodes, anterior inter-axillary anastomosis to the Lt, Rt axillo-inguinal anastomosis; Full RUE sequence performed starting medial > lateral brachium then proximal to distal to the dorsum of the hand. Re-worked all surfaces    Passive ROM  In Supine into flexion, abduction and D2              PT Education - 05/26/19 0855    Education Details  Added bil UE 3 way raises to pts [employer] HEP with having her return demo    Person(s) Educated  Patient    Methods  Explanation;Demonstration;Handout    Comprehension  Verbalized understanding;Returned demonstration       PT Short Term Goals - 04/28/19 1826      PT SHORT TERM GOAL #1   Title  Pt will demonstrate proper mechanics and techniques w/current HEP in order to adequately address R shoulder stiffness.    Baseline  Pt has no HEP    Time  2    Period  Weeks    Status  New    Target Date  06/02/19        PT Long Term Goals - 05/26/19 1003      PT LONG TERM GOAL #1    Title  Pt will improve R shoulder flexion to 160 degrees within 4 weeks in order to address functional mobility deficits and decrease risk for injury.    Baseline  R shoulder flexion 149; 165 degrees - 05/26/19    Status  Achieved      PT LONG TERM GOAL #2   Title  Pt will receive an appropriate compression garment within 4 weeks in order to address RUE stage 1 lymphedema and prevent progression.    Baseline  Pt has a compression sleeve that does not fit properly and is over 25 months old; pt thinks doctor has just faxed order to [facility] and will call for appt soon - 05/26/19    Status  On-going      PT LONG TERM GOAL #3   Title  Pt will be independent in self MLD for long term management of right UE within 4 weeks to decrease risk for infection.    Baseline  pt currently states she does not perform MLD; pt is now performing MLD most days, is looking into getting a compression pump and should be measured for compression garments soon - 05/26/19    Status  On-going            Plan - 05/26/19 0858    Clinical Impression Statement  Renewal done today for 1x/wk for up to 4 more weeks per pt and discussion with Inetta Fermo, PT. Pt is making good progress towards goals except hasn't received compression sleeve yet due to slow response from referring doctor. Pt was assured over mychart yesterday from her doctor though that her order was sent to [facility] and she plans to call there soon for appointment to be measured. She is independent with self MLD and her A/ROM has improved well.    Personal Factors and Comorbidities  Comorbidity 1    Comorbidities  Bil mastecomy w/reconstruction    Stability/Clinical Decision Making  Stable/Uncomplicated    Rehab Potential  Excellent    PT Frequency  1x / week    PT Duration  4 weeks    PT Treatment/Interventions  Electrical Stimulation;DME Instruction;Functional mobility training;Therapeutic activities;Therapeutic exercise;Neuromuscular  re-education;Patient/family education;Manual techniques;Manual lymph drainage;Compression bandaging;Passive range of motion;Taping;Vasopneumatic Device  PT Next Visit Plan  Renewal done today for 1x/wk for up to 4 weeks. Remeasure circumference each session and determine how pt is doing with management of lymphedema symptoms.    PT Home Exercise Plan  Access Code: [email or web address]    Consulted and Agree with Plan of Care  Patient       Patient will benefit from skilled therapeutic intervention in order to improve the following deficits and impairments:  Decreased range of motion, Decreased knowledge of precautions, Improper body mechanics  Visit Diagnosis: Malignant neoplasm of lower-outer quadrant of right breast of female, estrogen receptor positive (HCC)  Postmastectomy lymphedema  Abnormal posture  Stiffness of right shoulder, not elsewhere classified  Muscle weakness (generalized)     Problem List Patient Active Problem List   Diagnosis Date Noted  . Neutropenic fever ([place]) 07/06/2017  . Normocytic anemia 07/06/2017  . Febrile neutropenia ([place]) 05/14/2017  . Malignant neoplasm of lower-outer quadrant of right breast of female, estrogen receptor positive ([place]) 03/17/2017  . Postpartum care following vaginal delivery (8/3) 03/15/2016  . Pregnancy 03/14/2016  . Normal labor 03/14/2016  . Rh negative status during pregnancy 03/14/2016    Otelia Limes, PTA 05/26/2019, 10:08 AM  [place], [place], [address] Phone: [phone]   Fax:  [phone]  Name: Ana Wu MRN: [id number] Date of Birth: 1980/12/28

## 2019-06-02 ENCOUNTER — Other Ambulatory Visit: Payer: Self-pay

## 2019-06-02 ENCOUNTER — Ambulatory Visit: Payer: BC Managed Care – PPO

## 2019-06-02 DIAGNOSIS — Z170 Estrogen receptor positive status [ER+]: Secondary | ICD-10-CM

## 2019-06-02 DIAGNOSIS — M6281 Muscle weakness (generalized): Secondary | ICD-10-CM

## 2019-06-02 DIAGNOSIS — I972 Postmastectomy lymphedema syndrome: Secondary | ICD-10-CM

## 2019-06-02 DIAGNOSIS — C50511 Malignant neoplasm of lower-outer quadrant of right female breast: Secondary | ICD-10-CM | POA: Diagnosis not present

## 2019-06-02 DIAGNOSIS — R293 Abnormal posture: Secondary | ICD-10-CM

## 2019-06-02 DIAGNOSIS — M25611 Stiffness of right shoulder, not elsewhere classified: Secondary | ICD-10-CM

## 2019-06-02 NOTE — Therapy (Signed)
[place], [place], [address] Phone: [phone]   Fax:  [phone]  Physical Therapy Treatment  Patient Details  Name: Ana Wu MRN: [id number] Date of Birth: Apr 28, 1981 No data recorded  Encounter Date: 06/02/2019  PT End of Session - 06/02/19 1609    Visit Number  10    Number of Visits  13    Date for PT Re-Evaluation  06/23/19    PT Start Time  1608    PT Stop Time  1647    PT Time Calculation (min)  39 min    Activity Tolerance  Patient tolerated treatment well    Behavior During Therapy  [facility] for tasks assessed/performed       Past Medical History:  Diagnosis Date  . Cancer ([place]) 02/2017   Breast Right     Past Surgical History:  Procedure Laterality Date  . BREAST FIBROADENOMA SURGERY     2001, 2007  . PORTACATH PLACEMENT Left 03/27/2017   Procedure: INSERTION PORT-A-CATH;  Surgeon: Stark Klein, MD;  Location: [facility];  Service: General;  Laterality: Left;  . WISDOM TOOTH EXTRACTION      There were no vitals filed for this visit.  Subjective Assessment - 06/02/19 1608    Subjective  Pt states that her spouse was out of town over the weekend and was lifting/pushing/pulling much more than usual so she feels like her arm is heavy and swollen.    Pertinent History  July 31st, 2020 laparascopic total hysterectomy and Rt mastectomy with total reconstruction on 09/14/2017, then Lt mastectomy with totaly reconstruction surgery on 08/03/2018, both were DIEP flaps. She took chemo therapy and finished in April of 2019.    Patient Stated Goals  control of Rt arm swelling for my Rt arm and imrpove my shoulder motion    Currently in Pain?  No/denies            LYMPHEDEMA/ONCOLOGY QUESTIONNAIRE - 06/02/19 1644      Right Upper Extremity Lymphedema   15 cm Proximal to Olecranon Process  37 cm    10 cm Proximal to Olecranon Process  34.9 cm    Olecranon Process  28.7 cm    15 cm  Proximal to Ulnar Styloid Process  28.6 cm    10 cm Proximal to Ulnar Styloid Process  24 cm    Just Proximal to Ulnar Styloid Process  16.7 cm    Across Hand at [employer]  18.8 cm    At [place] of 2nd Digit  5.6 cm           [facility]patient Re[facility] from [facility]  Lymphedema Life Impact Scale Total Score  13.24 %           [facility] Adult PT Treatment/Exercise - 06/02/19 0001      Shoulder Exercises: Standing   Flexion  AROM;Right;10 reps    Flexion Limitations  AROM for the R shoulder to faclitate lymphatic flow.     ABduction  AROM;Right;10 reps    Shoulder ABduction Weight (lbs)  Shoulder abd ROM to facilitate lymphatic flow.     Other Standing Exercises  Cat/Cow modified on the wall 10x     Other Standing Exercises  Bil UE serratus stretching to open the axilla and stimulate lymphatic flow. , seated lumbar rotation w/arms cross for deep stretching in the R scapular musculature to promote mobility and lymphatic drainage.       Manual  Therapy   Manual Therapy  Manual Lymphatic Drainage (MLD);Passive ROM    Manual Lymphatic Drainage (MLD)  In Supine: Short neck, Lt axillary and Rt inguinal lymph nodes, anterior inter-axillary anastomosis to the Lt, Rt axillo-inguinal anastomosis; Full RUE sequence performed starting medial > lateral brachium then proximal to distal to the dorsum of the hand. Re-worked all surfaces    Passive ROM  In Supine into flexion, abduction and D2              PT Education - 06/02/19 1652    Education Details  Pt is going to get fitted on Friday for flat knit garment. Discussed continueing with current HEP.    Person(s) Educated  Patient    Methods  Explanation    Comprehension  Verbalized understanding       PT Short Term Goals - 04/28/19 1826      PT SHORT TERM GOAL #1   Title  Pt will demonstrate proper mechanics and techniques w/current HEP in order to adequately address R shoulder stiffness.     Baseline  Pt has no HEP    Time  2    Period  Weeks    Status  New    Target Date  06/02/19        PT Long Term Goals - 05/26/19 1003      PT LONG TERM GOAL #1   Title  Pt will improve R shoulder flexion to 160 degrees within 4 weeks in order to address functional mobility deficits and decrease risk for injury.    Baseline  R shoulder flexion 149; 165 degrees - 05/26/19    Status  Achieved      PT LONG TERM GOAL #2   Title  Pt will receive an appropriate compression garment within 4 weeks in order to address RUE stage 1 lymphedema and prevent progression.    Baseline  Pt has a compression sleeve that does not fit properly and is over 32 months old; pt thinks doctor has just faxed order to [facility] and will call for appt soon - 05/26/19    Status  On-going      PT LONG TERM GOAL #3   Title  Pt will be independent in self MLD for long term management of right UE within 4 weeks to decrease risk for infection.    Baseline  pt currently states she does not perform MLD; pt is now performing MLD most days, is looking into getting a compression pump and should be measured for compression garments soon - 05/26/19    Status  On-going            Plan - 06/02/19 1608    Clinical Impression Statement  Pt presents today with an increase in edema since her last measurements possibly due to increased usage of her RUE w/o compression garment. Pt was taken through easy AROM exercises this session to facilitate lymphatic flow. MLD was performed for anterior trunk and RUE to facilitate lymphatic fluid flow out of the RUE. Pt will benefit from continued POC.    Personal Factors and Comorbidities  Comorbidity 1    Comorbidities  Bil mastecomy w/reconstruction    Stability/Clinical Decision Making  Stable/Uncomplicated    Rehab Potential  Excellent    PT Frequency  1x / week    PT Duration  4 weeks    PT Treatment/Interventions  Electrical Stimulation;DME Instruction;Functional mobility  training;Therapeutic activities;Therapeutic exercise;Neuromuscular re-education;Patient/family education;Manual techniques;Manual lymph drainage;Compression bandaging;Passive range of motion;Taping;Vasopneumatic Device  PT Next Visit Plan  Re-measure circumference, 3 more visits    PT Home Exercise Plan  Access Code: [email or web address]    Consulted and Agree with Plan of Care  Patient       Patient will benefit from skilled therapeutic intervention in order to improve the following deficits and impairments:  Decreased range of motion, Decreased knowledge of precautions, Improper body mechanics  Visit Diagnosis: Malignant neoplasm of lower-outer quadrant of right breast of female, estrogen receptor positive (HCC)  Postmastectomy lymphedema  Abnormal posture  Stiffness of right shoulder, not elsewhere classified  Muscle weakness (generalized)     Problem List Patient Active Problem List   Diagnosis Date Noted  . Neutropenic fever ([place]) 07/06/2017  . Normocytic anemia 07/06/2017  . Febrile neutropenia ([place]) 05/14/2017  . Malignant neoplasm of lower-outer quadrant of right breast of female, estrogen receptor positive ([place]) 03/17/2017  . Postpartum care following vaginal delivery (8/3) 03/15/2016  . Pregnancy 03/14/2016  . Normal labor 03/14/2016  . Rh negative status during pregnancy 03/14/2016    Ander Purpura, PT 06/02/2019, 4:52 PM  [place], [place], [address] Phone: [phone]   Fax:  [phone]  Name: Ana Wu MRN: [id number] Date of Birth: 29-Feb-1981

## 2019-06-09 ENCOUNTER — Ambulatory Visit: Payer: BC Managed Care – PPO

## 2019-06-09 ENCOUNTER — Other Ambulatory Visit: Payer: Self-pay

## 2019-06-09 DIAGNOSIS — C50511 Malignant neoplasm of lower-outer quadrant of right female breast: Secondary | ICD-10-CM

## 2019-06-09 DIAGNOSIS — M25611 Stiffness of right shoulder, not elsewhere classified: Secondary | ICD-10-CM

## 2019-06-09 DIAGNOSIS — M6281 Muscle weakness (generalized): Secondary | ICD-10-CM

## 2019-06-09 DIAGNOSIS — I972 Postmastectomy lymphedema syndrome: Secondary | ICD-10-CM

## 2019-06-09 DIAGNOSIS — R293 Abnormal posture: Secondary | ICD-10-CM

## 2019-06-09 NOTE — Therapy (Signed)
[place], [place], [address] Phone: [phone]   Fax:  [phone]  Physical Therapy Treatment  Patient Details  Name: RHYLEY GWATHNEY MRN: [id number] Date of Birth: 01/24/1981 No data recorded  Encounter Date: 06/09/2019  PT End of Session - 06/09/19 1600    Visit Number  11    Number of Visits  13    Date for PT Re-Evaluation  06/23/19    PT Start Time  1559    PT Stop Time  1653    PT Time Calculation (min)  54 min    Activity Tolerance  Patient tolerated treatment well    Behavior During Therapy  [facility] for tasks assessed/performed       Past Medical History:  Diagnosis Date  . Cancer ([place]) 02/2017   Breast Right     Past Surgical History:  Procedure Laterality Date  . BREAST FIBROADENOMA SURGERY     2001, 2007  . PORTACATH PLACEMENT Left 03/27/2017   Procedure: INSERTION PORT-A-CATH;  Surgeon: Stark Klein, MD;  Location: [place];  Service: General;  Laterality: Left;  . WISDOM TOOTH EXTRACTION      There were no vitals filed for this visit.  Subjective Assessment - 06/09/19 1600    Subjective  Pt reports that her arm is feeling much better and her pump is shipping today. She reports that she is getting her sleeve in a couple of weeks.    Pertinent History  July 31st, 2020 laparascopic total hysterectomy and Rt mastectomy with total reconstruction on 09/14/2017, then Lt mastectomy with totaly reconstruction surgery on 08/03/2018, both were DIEP flaps. She took chemo therapy and finished in April of 2019.    Patient Stated Goals  control of Rt arm swelling for my Rt arm and imrpove my shoulder motion    Currently in Pain?  No/denies            LYMPHEDEMA/ONCOLOGY QUESTIONNAIRE - 06/09/19 1604      Right Upper Extremity Lymphedema   15 cm Proximal to Olecranon Process  36.6 cm    10 cm Proximal to Olecranon Process  34.4 cm    Olecranon Process  28.8 cm    15 cm Proximal to Ulnar  Styloid Process  27.4 cm    10 cm Proximal to Ulnar Styloid Process  24.1 cm    Just Proximal to Ulnar Styloid Process  16.3 cm    Across Hand at [employer]  18.9 cm    At [place] of 2nd Digit  5.8 cm           Outpatient Re[facility]  Lymphedema Life Impact Scale Total Score  13.24 %           [facility] Adult PT Treatment/Exercise - 06/09/19 0001      Shoulder Exercises: Standing   Other Standing Exercises  Strength after breast cancer sheet, standing unilateral pec stretch R 20 seconds, Triceps stretch 20 seconds, posterior shoulder stretch 20 seconds, Bil gastroc stretch 20 seconds, quad stretch Bil 20 seconds, butterfly stretch 20 seconds, Bil HS stretch 20 seconds, Lower trunk rotation Bil 20 seconds each, Supine glute bridge w/VC for glute squeeze to the top 10x, quadruped alt arm/leg 10x each side alt arm on 1x with on difficulty performed legs w/greater rotation at the pelvis on the RLE up demonstrating core weakness so just did LE at this time. Pelvic tilt instead of crunch due to recent  hysterectomy and significant core weakness with good results tactile cueing and VC to get transverse abdominus activation adequately.        Manual Therapy   Manual Therapy  Manual Lymphatic Drainage (MLD);Passive ROM    Manual Lymphatic Drainage (MLD)  In Supine: Short neck, Lt axillary and Rt inguinal lymph nodes, anterior inter-axillary anastomosis to the Lt, Rt axillo-inguinal anastomosis; Full RUE sequence performed starting medial > lateral brachium then proximal to distal to the dorsum of the hand. Re-worked all surfaces    Passive ROM  In Supine into flexion, abduction and D2              PT Education - 06/09/19 1648    Education Details  Pt will perform up to page 15 in the strength after breast cancer packet. Discussed performing just legs on superman due to core weakness and pelvic tilt at this time instead of crunch.     Person(s) Educated  Patient    Methods  Explanation;Demonstration;Tactile cues;Verbal cues;Handout    Comprehension  Verbalized understanding;Returned demonstration       PT Short Term Goals - 04/28/19 1826      PT SHORT TERM GOAL #1   Title  Pt will demonstrate proper mechanics and techniques w/current HEP in order to adequately address R shoulder stiffness.    Baseline  Pt has no HEP    Time  2    Period  Weeks    Status  New    Target Date  06/02/19        PT Long Term Goals - 05/26/19 1003      PT LONG TERM GOAL #1   Title  Pt will improve R shoulder flexion to 160 degrees within 4 weeks in order to address functional mobility deficits and decrease risk for injury.    Baseline  R shoulder flexion 149; 165 degrees - 05/26/19    Status  Achieved      PT LONG TERM GOAL #2   Title  Pt will receive an appropriate compression garment within 4 weeks in order to address RUE stage 1 lymphedema and prevent progression.    Baseline  Pt has a compression sleeve that does not fit properly and is over 25 months old; pt thinks doctor has just faxed order to [place] and will call for appt soon - 05/26/19    Status  On-going      PT LONG TERM GOAL #3   Title  Pt will be independent in self MLD for long term management of right UE within 4 weeks to decrease risk for infection.    Baseline  pt currently states she does not perform MLD; pt is now performing MLD most days, is looking into getting a compression pump and should be measured for compression garments soon - 05/26/19    Status  On-going            Plan - 06/09/19 1559    Clinical Impression Statement  Pt presents today with decreased edema in her RUE compared to her measurements last week. Strength after breast cancer program was initiated today with a couple of modifications including single leg lift instead of opposite arm/leg lift and pelvic tilt instead of abdominal crunch due to significant core weakness  demonstrated by difficulty with transverse abdominus activation and endurance time as well as significant pelvic rotation with lifting the RLE in quadruped. MLD was performed today prior to exercising and circular knit garment was donned. Pt will benefit  from continued POC.    Personal Factors and Comorbidities  Comorbidity 1    Comorbidities  Bil mastecomy w/reconstruction    Rehab Potential  Excellent    PT Frequency  1x / week    PT Duration  4 weeks    PT Treatment/Interventions  Electrical Stimulation;DME Instruction;Functional mobility training;Therapeutic activities;Therapeutic exercise;Neuromuscular re-education;Patient/family education;Manual techniques;Manual lymph drainage;Compression bandaging;Passive range of motion;Taping;Vasopneumatic Device    PT Next Visit Plan  Re-measure circumference, 2 more visits finish strength after breast cancer    PT Home Exercise Plan  Access Code: [email or web address]    Consulted and Agree with Plan of Care  Patient       Patient will benefit from skilled therapeutic intervention in order to improve the following deficits and impairments:  Decreased range of motion, Decreased knowledge of precautions, Improper body mechanics  Visit Diagnosis: Malignant neoplasm of lower-outer quadrant of right breast of female, estrogen receptor positive (HCC)  Postmastectomy lymphedema  Abnormal posture  Stiffness of right shoulder, not elsewhere classified  Muscle weakness (generalized)     Problem List Patient Active Problem List   Diagnosis Date Noted  . Neutropenic fever ([place]) 07/06/2017  . Normocytic anemia 07/06/2017  . Febrile neutropenia ([place]) 05/14/2017  . Malignant neoplasm of lower-outer quadrant of right breast of female, estrogen receptor positive ([place]) 03/17/2017  . Postpartum care following vaginal delivery (8/3) 03/15/2016  . Pregnancy 03/14/2016  . Normal labor 03/14/2016  . Rh negative status during pregnancy 03/14/2016    Ander Purpura , PT 06/09/2019, 4:56 PM  [place], [place], [address] Phone: [phone]   Fax:  [phone]  Name: AMIL JAMMAL MRN: [id number] Date of Birth: [birth year]

## 2019-06-16 ENCOUNTER — Ambulatory Visit: Payer: BC Managed Care – PPO | Attending: Nurse Practitioner

## 2019-06-16 ENCOUNTER — Other Ambulatory Visit: Payer: Self-pay

## 2019-06-16 DIAGNOSIS — Z170 Estrogen receptor positive status [ER+]: Secondary | ICD-10-CM | POA: Diagnosis present

## 2019-06-16 DIAGNOSIS — M25611 Stiffness of right shoulder, not elsewhere classified: Secondary | ICD-10-CM

## 2019-06-16 DIAGNOSIS — R293 Abnormal posture: Secondary | ICD-10-CM | POA: Insufficient documentation

## 2019-06-16 DIAGNOSIS — M6281 Muscle weakness (generalized): Secondary | ICD-10-CM | POA: Insufficient documentation

## 2019-06-16 DIAGNOSIS — C50511 Malignant neoplasm of lower-outer quadrant of right female breast: Secondary | ICD-10-CM | POA: Diagnosis present

## 2019-06-16 DIAGNOSIS — I972 Postmastectomy lymphedema syndrome: Secondary | ICD-10-CM | POA: Insufficient documentation

## 2019-06-16 NOTE — Patient Instructions (Signed)
Access Code: [email or web address]  URL: [email or web address]  Date: 06/16/2019  Prepared by: Tomma Rakers   Exercises Standing 'L' Stretch at Counter - 10 reps - 2 sets - 3 s hold - 1x daily - 7x weekly Standing Shoulder Flexion with Resistance - 10 reps - 1 sets - 1x daily - 3-4x weekly Doorway Pec Stretch at 90 Degrees Abduction - 5 reps - 2 sets - 10 s hold - 1x daily - 7x weekly Shoulder PNF D2 with Resistance - 10 reps - 1 sets - 1x daily - 3-4x weekly Standing Single Arm Shoulder Abduction with Resistance - 10 reps - 1 sets - 1x daily - 3-4x weekly Scaption with Dumbbells - 10 reps - 1 sets - 2 lbs hold - 1x daily - 7x weekly Standing Shoulder Horizontal Abduction with Resistance - 10 reps - 1 sets - 2 lbs hold - 1x daily - 7x weekly Standing Shoulder Flexion to 90 Degrees with Dumbbells - 10 reps - 1 sets - 2 lbs hold - 1x daily - 7x weekly

## 2019-06-16 NOTE — Therapy (Signed)
[place], [place], [address] Phone: [phone]   Fax:  [phone]  Physical Therapy Treatment  Patient Details  Name: Ana Wu MRN: [id number] Date of Birth: 09/21/1980 No data recorded  Encounter Date: 06/16/2019  PT End of Session - 06/16/19 1610    Visit Number  12    Number of Visits  13    Date for PT Re-Evaluation  06/23/19    PT Start Time  1608    PT Stop Time  1648    PT Time Calculation (min)  40 min    Activity Tolerance  Patient tolerated treatment well    Behavior During Therapy  [facility] for tasks assessed/performed       Past Medical History:  Diagnosis Date  . Cancer ([place]) 02/2017   Breast Right     Past Surgical History:  Procedure Laterality Date  . BREAST FIBROADENOMA SURGERY     2001, 2007  . PORTACATH PLACEMENT Left 03/27/2017   Procedure: INSERTION PORT-A-CATH;  Surgeon: Stark Klein, MD;  Location: [place];  Service: General;  Laterality: Left;  . WISDOM TOOTH EXTRACTION      There were no vitals filed for this visit.  Subjective Assessment - 06/16/19 1610    Subjective  Pt reports that she received her pump and she has been using it at home but she is going to call Vicente Males to get him to help her fit the shoulder better. She still has not received her sleeve and she has no pain at this time. She states that she has been wearing her regular sleeve every day.    Pertinent History  July 31st, 2020 laparascopic total hysterectomy and Rt mastectomy with total reconstruction on 09/14/2017, then Lt mastectomy with totaly reconstruction surgery on 08/03/2018, both were DIEP flaps. She took chemo therapy and finished in April of 2019.    Patient Stated Goals  control of Rt arm swelling for my Rt arm and imrpove my shoulder motion    Currently in Pain?  No/denies            LYMPHEDEMA/ONCOLOGY QUESTIONNAIRE - 06/16/19 1612      Right Upper Extremity Lymphedema   15 cm  Proximal to Olecranon Process  36 cm    10 cm Proximal to Olecranon Process  34.2 cm    Olecranon Process  27.2 cm    15 cm Proximal to Ulnar Styloid Process  27.4 cm    10 cm Proximal to Ulnar Styloid Process  23.8 cm    Just Proximal to Ulnar Styloid Process  16.3 cm    Across Hand at [employer]  18.7 cm    At [place] of 2nd Digit  5.8 cm           Outpatient Re[facility] from [facility]/28/[facility]  Lymphedema Life Impact Scale Total Score  13.24 %           [facility] Adult PT Treatment/Exercise - 06/16/19 0001      Shoulder Exercises: Standing   Other Standing Exercises  Standing T and Flexion to 90 degrees with 2# wgts Bil w/VC for direction of thumb to prevent impingement w/o an increase in pain from baseline.     Other Standing Exercises  Strength after breast cancer sit to stand w/weights 2# 10x VC and demonstration to keep knees apart and maintain core activation to prevent excessive lumbar lordosis, Supine bench press 2# Bil arms VC and demonstration  for correct movement, Standing single arm row 2# w/demonstration and VC for scapular squeeze, standing shoulder extension 2# w/VC and demonstration for scapular squeeze, Dead lift 4# w/demonstration VC for tight belly to prevent excessive lumbar lordosis and for scapular squeeze to prevent stress on low back, Standing V w/2# wgts in each hand, step up on 6" step Bil 2# wgts in each hand demonstration for correct movement and VC to take time for balance, Heel raises with 2# wgts in each hand with demonsration and VC to squeeze buttocks, standing bicep curl w/2# weights with demonstration and VC for slow eccentric movement. 10x for each exercise.              PT Education - 06/16/19 1646    Education Details  Access Code: [email or web address] Pt will perform her HEP at home as well as strength after breast cancer packet with modifications intermittently througout the week 5x or more. Discussed the importance  of getting Vicente Males to come and fit pump sleeve in order to decrease abnormal pressure on the arm before using pump.    Person(s) Educated  Patient    Methods  Explanation;Demonstration;Tactile cues;Verbal cues;Handout    Comprehension  Verbalized understanding       PT Short Term Goals - 04/28/19 1826      PT SHORT TERM GOAL #1   Title  Pt will demonstrate proper mechanics and techniques w/current HEP in order to adequately address R shoulder stiffness.    Baseline  Pt has no HEP    Time  2    Period  Weeks    Status  New    Target Date  06/02/19        PT Long Term Goals - 05/26/19 1003      PT LONG TERM GOAL #1   Title  Pt will improve R shoulder flexion to 160 degrees within 4 weeks in order to address functional mobility deficits and decrease risk for injury.    Baseline  R shoulder flexion 149; 165 degrees - 05/26/19    Status  Achieved      PT LONG TERM GOAL #2   Title  Pt will receive an appropriate compression garment within 4 weeks in order to address RUE stage 1 lymphedema and prevent progression.    Baseline  Pt has a compression sleeve that does not fit properly and is over 13 months old; pt thinks doctor has just faxed order to [place] and will call for appt soon - 05/26/19    Status  On-going      PT LONG TERM GOAL #3   Title  Pt will be independent in self MLD for long term management of right UE within 4 weeks to decrease risk for infection.    Baseline  pt currently states she does not perform MLD; pt is now performing MLD most days, is looking into getting a compression pump and should be measured for compression garments soon - 05/26/19    Status  On-going            Plan - 06/16/19 1609    Clinical Impression Statement  Pt presents with continued decrease in edema in her RUE from her previous session when her pain was increased from picking up her daughter. Pt finished the strength after breast cancer exercises today with good results. She  requires occasional VC and demonstration for form with VC for tight core musculature. Pt was able to tolerate slight increase in Bil shoulder ther-ex this  session w/o an increase in pain from baseline which she was previously only able to do anti-gravity. Pt will benefit from continued POC.    Personal Factors and Comorbidities  Comorbidity 1    Comorbidities  Bil mastecomy w/reconstruction    Stability/Clinical Decision Making  Stable/Uncomplicated    Rehab Potential  Excellent    PT Frequency  1x / week    PT Duration  4 weeks    PT Treatment/Interventions  Electrical Stimulation;DME Instruction;Functional mobility training;Therapeutic activities;Therapeutic exercise;Neuromuscular re-education;Patient/family education;Manual techniques;Manual lymph drainage;Compression bandaging;Passive range of motion;Taping;Vasopneumatic Device    PT Next Visit Plan  Re-measure circumference, Progress Note    PT Home Exercise Plan  Access Code: [email or web address]    Recommended Other Services  Pt has received her flexitouch.    Consulted and Agree with Plan of Care  Patient       Patient will benefit from skilled therapeutic intervention in order to improve the following deficits and impairments:  Decreased range of motion, Decreased knowledge of precautions, Improper body mechanics  Visit Diagnosis: Malignant neoplasm of lower-outer quadrant of right breast of female, estrogen receptor positive (HCC)  Postmastectomy lymphedema  Abnormal posture  Stiffness of right shoulder, not elsewhere classified  Muscle weakness (generalized)     Problem List Patient Active Problem List   Diagnosis Date Noted  . Neutropenic fever ([place]) 07/06/2017  . Normocytic anemia 07/06/2017  . Febrile neutropenia ([place]) 05/14/2017  . Malignant neoplasm of lower-outer quadrant of right breast of female, estrogen receptor positive ([place]) 03/17/2017  . Postpartum care following vaginal delivery (8/3) 03/15/2016  . Pregnancy  03/14/2016  . Normal labor 03/14/2016  . Rh negative status during pregnancy 03/14/2016    Ander Purpura, PT 06/16/2019, 4:51 PM  [place], [place], [address] Phone: [phone]   Fax:  [phone]  Name: LAILANA ZUMBRUN MRN: [id number] Date of Birth: June 10, 1981

## 2019-06-23 ENCOUNTER — Other Ambulatory Visit: Payer: Self-pay

## 2019-06-23 ENCOUNTER — Ambulatory Visit: Payer: BC Managed Care – PPO

## 2019-06-23 DIAGNOSIS — C50511 Malignant neoplasm of lower-outer quadrant of right female breast: Secondary | ICD-10-CM

## 2019-06-23 DIAGNOSIS — I972 Postmastectomy lymphedema syndrome: Secondary | ICD-10-CM

## 2019-06-23 DIAGNOSIS — M25611 Stiffness of right shoulder, not elsewhere classified: Secondary | ICD-10-CM

## 2019-06-23 DIAGNOSIS — R293 Abnormal posture: Secondary | ICD-10-CM

## 2019-06-23 DIAGNOSIS — M6281 Muscle weakness (generalized): Secondary | ICD-10-CM

## 2019-06-23 NOTE — Therapy (Signed)
[place], [place], [address] Phone: [phone]   Fax:  [phone]  Physical Therapy Discharge Note  Patient Details  Name: Ana Wu MRN: [id number] Date of Birth: August 29, 1980 No data recorded  Encounter Date: 06/23/2019  PT End of Session - 06/23/19 1604    Visit Number  13    Number of Visits  13    Date for PT Re-Evaluation  06/23/19    PT Start Time  1604    PT Stop Time  1650    PT Time Calculation (min)  46 min    Activity Tolerance  Patient tolerated treatment well    Behavior During Therapy  [facility] for tasks assessed/performed       Past Medical History:  Diagnosis Date  . Cancer ([place]) 02/2017   Breast Right     Past Surgical History:  Procedure Laterality Date  . BREAST FIBROADENOMA SURGERY     2001, 2007  . PORTACATH PLACEMENT Left 03/27/2017   Procedure: INSERTION PORT-A-CATH;  Surgeon: Stark Klein, MD;  Location: [place];  Service: General;  Laterality: Left;  . WISDOM TOOTH EXTRACTION      There were no vitals filed for this visit.      [facility] PT Assessment - 06/23/19 0001      AROM   Right Shoulder Flexion  170 Degrees    Right Shoulder ABduction  167 Degrees              Outpatient Rehab from [facility]/28/[facility]  Lymphedema Life Impact Scale Total Score  13.24 %           [facility] Adult PT Treatment/Exercise - 06/23/19 0001      Shoulder Exercises: Standing   Other Standing Exercises  Standing T and Flexion to 90 degrees with 3# wgts Bil short demonstration for thumb up,  2x 5 due to pt reporting she only has 3# wgts at home but they are too heavy to do a continuous 10    Other Standing Exercises  Assessed modified dead lift on pt request due to she states she has had some tightness in her Low back, Pt had significant difficulty with posterior pelvic tilt in standing due to glute and transverse abdominus weakness.  She was able to achieve in supine with knees bent and in standing leaning against the wall for upper body support. performed this way 10x 5 seconds, attempted in standing and pt was unable. Performed on swiss ball in sitting with good results no compensation noted tactile cueing, demonstration and VC used for correct movement. Performed lateral pelvic shift 10x Bil for proprioceptive control and core activation with VC for correct movement. Pt performed modified bicycle with short movements due to weakness in supine 20x alt, Bicep curls 3# 20x with good form only 1x VC to bring shoulders down, Heel raises 20x with 3# wgts in Bil hands with good form, Bent single arm row with RUE 5# 20x good form, Standing tricep kick back 3# 20x good form, 8 " step up with 5# wgt in Bil hands 10x Bil LE with good form no VC required pause with opposite foot held high on step up with good balance improved from last session.              PT Education - 06/23/19 1637    Education Details  Access Code: [email or web address], pt will continue with strength after breast cancer program. Discussed performing core activities added  to HEP prior to performing dead lift due to weakness in the core and to prevent injury. Pt discussed which activities to decrease with 3# wgts and which she is able to use the 5# wgts with that she has at home.    Person(s) Educated  Patient    Methods  Explanation;Demonstration;Handout;Verbal cues    Comprehension  Verbalized understanding;Returned demonstration       PT Short Term Goals - 06/23/19 1605      PT SHORT TERM GOAL #1   Title  Pt will demonstrate proper mechanics and techniques w/current HEP in order to adequately address R shoulder stiffness.    Baseline  Pt reports that she is performing her exercises at home.    Status  Achieved        PT Long Term Goals - 06/23/19 1607      PT LONG TERM GOAL #1   Title  Pt will improve R shoulder flexion to 160 degrees within 4 weeks in order to  address functional mobility deficits and decrease risk for injury.    Baseline  R shoulder flexion 170; 167 degrees 06/23/2019    Status  Achieved      PT LONG TERM GOAL #2   Title  Pt will receive an appropriate compression garment within 4 weeks in order to address RUE stage 1 lymphedema and prevent progression.    Baseline  Pt is still waiting on her custom made flat knit sleeve. She is wearing an old circular knit and should receive her flat knit soon. She has received a flexitouch pump that was adjusted today.    Status  Partially Met      PT LONG TERM GOAL #3   Title  Pt will be independent in self MLD for long term management of right UE within 4 weeks to decrease risk for infection.    Baseline  Pt states that she is doing self MLD at home without difficulty. She also has a received a flexitouch compression pump and is able to demonstrate self MLD with good technique at previous appointments.    Status  Achieved            Plan - 06/23/19 1604    Clinical Impression Statement  Pt presents with her circular knit garment on her RUE. She continues with decreased measurements of her R arm and no visible increase in edema. She has met most of her goals the only one she has not met as of this time if she has not received her flat knit compression, but she is currently wearing her circular knit compression until she receives her custom flat knit that has already been ordered. Exercises for strength after breast cancer program were discussed in depth today and discussed correct wearing of the sleeve including making sure that it does not produce a bottle neck effect at the hand due to pulling it below the ulnar styloid process. Pt will be discharged at this time and is aware of reasons to return to MD for possible exacerbation of lymphedema including increased pain, heaviness or edema that does not go down with exercise, MLD or compression.    Personal Factors and Comorbidities  Comorbidity 1     Comorbidities  Bil mastecomy w/reconstruction    PT Frequency  1x / week    PT Duration  4 weeks    PT Treatment/Interventions  Electrical Stimulation;DME Instruction;Functional mobility training;Therapeutic activities;Therapeutic exercise;Neuromuscular re-education;Patient/family education;Manual techniques;Manual lymph drainage;Compression bandaging;Passive range of motion;Taping;Vasopneumatic Device  PT Next Visit Plan  Pt will be discharged today.    PT Home Exercise Plan  Access Code: [email or web address]    Consulted and Agree with Plan of Care  Patient       Patient will benefit from skilled therapeutic intervention in order to improve the following deficits and impairments:  Decreased range of motion, Decreased knowledge of precautions, Improper body mechanics  Visit Diagnosis: Postmastectomy lymphedema  Malignant neoplasm of lower-outer quadrant of right breast of female, estrogen receptor positive (HCC)  Abnormal posture  Stiffness of right shoulder, not elsewhere classified  Muscle weakness (generalized)     Problem List Patient Active Problem List   Diagnosis Date Noted  . Neutropenic fever ([place]) 07/06/2017  . Normocytic anemia 07/06/2017  . Febrile neutropenia ([place]) 05/14/2017  . Malignant neoplasm of lower-outer quadrant of right breast of female, estrogen receptor positive ([place]) 03/17/2017  . Postpartum care following vaginal delivery (8/3) 03/15/2016  . Pregnancy 03/14/2016  . Normal labor 03/14/2016  . Rh negative status during pregnancy 03/14/2016   PHYSICAL THERAPY DISCHARGE SUMMARY   Plan: Patient agrees to discharge.  Patient goals were partially met. Patient is being discharged due to being pleased with the current functional level.  ?????      Ander Purpura, PT 06/23/2019, 5:01 PM  [place], [place], [address] Phone: [phone]   Fax:  [phone]  Name: Ana Wu MRN: [id number] Date of Birth: Mar 23, 1981

## 2019-06-23 NOTE — Patient Instructions (Signed)
Access Code: [email or web address]  URL: [email or web address]  Date: 06/23/2019  Prepared by: Tomma Rakers   Exercises Pelvic Tilt on Swiss Ball - 10 reps - 1 sets - 1x daily - 7x weekly Seated Lateral Pelvic Tilt on Swiss Ball - 10 reps - 1 sets - 1x daily                            - 7x weekly Supine Core Bicycle - 20 reps - 1 sets - 1x daily - 7x weekly Scaption with Dumbbells - 10 reps - 1 sets - 2 lbs hold - 1x daily - 7x weekly Standing Shoulder Horizontal Abduction with Resistance - 10 reps - 1 sets - 2 lbs hold - 1x daily - 7x weekly Standing Shoulder Flexion to 90 Degrees with Dumbbells - 10 reps - 1 sets - 2 lbs hold - 1x daily - 7x weekly

## 2019-07-22 ENCOUNTER — Ambulatory Visit: Payer: BC Managed Care – PPO | Attending: Nurse Practitioner

## 2019-07-22 ENCOUNTER — Other Ambulatory Visit: Payer: Self-pay

## 2019-07-22 DIAGNOSIS — M6281 Muscle weakness (generalized): Secondary | ICD-10-CM | POA: Diagnosis present

## 2019-07-22 DIAGNOSIS — R293 Abnormal posture: Secondary | ICD-10-CM | POA: Insufficient documentation

## 2019-07-22 DIAGNOSIS — M25611 Stiffness of right shoulder, not elsewhere classified: Secondary | ICD-10-CM | POA: Diagnosis present

## 2019-07-22 DIAGNOSIS — C50511 Malignant neoplasm of lower-outer quadrant of right female breast: Secondary | ICD-10-CM | POA: Diagnosis present

## 2019-07-22 DIAGNOSIS — I972 Postmastectomy lymphedema syndrome: Secondary | ICD-10-CM | POA: Diagnosis not present

## 2019-07-22 DIAGNOSIS — Z170 Estrogen receptor positive status [ER+]: Secondary | ICD-10-CM | POA: Diagnosis present

## 2019-07-22 NOTE — Therapy (Signed)
[place], [place], [address] Phone: [phone]   Fax:  [phone]  Physical Therapy Evaluation  Patient Details  Name: Ana Wu MRN: [id number] Date of Birth: 05/13/1981 Referring Provider (PT): Cyndie Mull, PT   Encounter Date: 07/22/2019  PT End of Session - 07/22/19 0856    Visit Number  1    Number of Visits  5    Date for PT Re-Evaluation  09/02/19    PT Start Time  0812    PT Stop Time  0850    PT Time Calculation (min)  38 min    Activity Tolerance  Patient tolerated treatment well    Behavior During Therapy  [facility] for tasks assessed/performed       Past Medical History:  Diagnosis Date  . Cancer ([place]) 02/2017   Breast Right     Past Surgical History:  Procedure Laterality Date  . BREAST FIBROADENOMA SURGERY     2001, 2007  . PORTACATH PLACEMENT Left 03/27/2017   Procedure: INSERTION PORT-A-CATH;  Surgeon: Stark Klein, MD;  Location: [place];  Service: General;  Laterality: Left;  . WISDOM TOOTH EXTRACTION      There were no vitals filed for this visit.   Subjective Assessment - 07/22/19 0815    Subjective  Pt states that about 1 week after her last session she was working in the yard and wearing a sports bra that ended up getting folded over and pushing into her back. She saw a lot of redness but wasn't too worried about it becuase her skin gets red with pressure. A couple of weeks later she started to feel sick and was running a fever with the redness continuing in her back. She notified her MD and was put on an antibiotic for cellulitis in her R posterior upper quadrant. She started to feel better after her round of antibiotics but the redness has continued which is why she was referred to lymphedema therapy. She finished her Keflex on December 1st.    Pertinent History  July 31st, 2020 laparascopic total hysterectomy and Rt mastectomy with total reconstruction on 09/14/2017,  then Lt mastectomy with totaly reconstruction surgery on 08/03/2018, both were DIEP flaps. She took chemo therapy and finished in April of 2019.    Patient Stated Goals  I want to find out what is going on in my back if it is lymphedema and how to treat it.    Currently in Pain?  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      Cognition   Overall Cognitive Status  Within Functional Limits for tasks assessed      Observation/Other Assessments   Observations  Small pink area on the lateral mid scapular region on the R  that is about  1 in in width and 3 inchs in length horizontally    Skin Integrity  Skin warm, dry and intact         LYMPHEDEMA/ONCOLOGY QUESTIONNAIRE - 07/22/19 0828      Type   Cancer Type  Right Breast Cancer      Surgeries   Mastectomy Date  09/14/17    Radical Mastectomy Date  09/14/17    Axillary Lymph Node Dissection Date  09/11/17    Other Surgery Date  03/12/19   Total hysterectomy   Number Lymph Nodes Removed  15   All axillary lymph nodes on the R     Treatment   Active Chemotherapy Treatment  No    Past Chemotherapy Treatment  Yes    Date  11/12/17    Active Radiation Treatment  No    Past Radiation Treatment  No    Current Hormone Treatment  Yes    Date  12/11/18    Drug Name  Femara    Past Hormone Therapy  No      What other symptoms do you have   Are you Having Heaviness or Tightness  No    Are you having Pain  No    Are you having  pitting edema  No    Is it Hard or Difficult finding clothes that fit  No    Do you have infections  Yes    Comments  1x finished antibiotic a little over 1 week ago.     Is there Decreased scar mobility  No      Lymphedema Stage   Stage  STAGE 2 SPONTANEOUSLY IRREVERSIBLE      Lymphedema Assessments   Lymphedema Assessments  Upper extremities      Right Upper Extremity Lymphedema   15 cm Proximal to Olecranon Process  35 cm    10 cm Proximal to Olecranon Process  33.4 cm    Olecranon Process  26.8 cm    15 cm Proximal to Ulnar Styloid Process  27.8 cm    10 cm Proximal to Ulnar Styloid Process  24 cm    Just Proximal to Ulnar Styloid Process  16.3 cm    Across Hand at [employer]  18 cm    At [place] of 2nd Digit  5.9 cm             Outpatient Re[facility] from [facility]  Lymphedema Life Impact Scale Total Score  8.82 %      Objective measurements completed on examination: See above findings.              PT Education - 07/22/19 0838    Education Details  Access Code: [email or web address], pt will perform HEP at home. She was provided with handout that was e-mailed to her personal e-mail account.    Person(s) Educated  Patient    Methods  Explanation;Handout    Comprehension  Verbalized understanding       PT Short Term Goals - 07/22/19 0902      PT SHORT TERM GOAL #1   Title  Pt will be independent with initial HEP in order to demonstrate autonomy of care.    Baseline  Pt does not have lymphatic facilitation exercises.    Time  2    Period  Weeks    Status  New    Target Date  08/12/19        PT Long Term Goals - 07/22/19 [id number]  PT LONG TERM GOAL #1   Title  Pt will not have redness over her L scapular area with visually reduced edema with less than 10% difference when compared to the L    Baseline  25% difference from R to L in sitting with correct posture    Time  4    Period  Weeks    Status  New     Target Date  08/26/19      PT LONG TERM GOAL #2   Title  Pt will receive an appropriate compression garment within 4 weeks in order to address R upper quadrant lymphdema    Baseline  Pt currently does not have compression bras at this time or compression shirt due to back up at DME store due to covid 19    Time  4    Period  Weeks    Status  New    Target Date  08/26/19             Plan - 07/22/19 0857    Clinical Impression Statement  Pt presents to physical therapy after her discharge 4 weeks ago. She states that she was working in the yard and did not notice that her sports bra was folded. She ended up with a red mark and increased edema that then turned into a cellulitis infection. She finished her antitiotic about 1 week ago and continues with some redness which is why she was referred to physical therapy. On visual inspection pt R posterior upper quadrant is larger with greater skin folding noted on the R compared to the L. A small pink area remains on ther lateral portion of the R scapula about mid-way that is ~1 inch in height and ~3 inches in length on the horizontal. Pt does have a scar that encroaches on the R axillo-inguinal anastomosis from her total hysterectomy which could possibly be interfering with lymphatic flow in this area; discussed this with pt. Pt will benefit from skilled physical therapy services 1x/week for 4 weeks in order to decrease fluid in the R posterior upper quadrant and ensuring that she has adequate compression in this area for home managment.    Personal Factors and Comorbidities  Comorbidity 1    Comorbidities  Bil mastecomy w/reconstruction    Stability/Clinical Decision Making  Stable/Uncomplicated    Clinical Decision Making  Low    Rehab Potential  Excellent    PT Frequency  1x / week    PT Duration  4 weeks    PT Treatment/Interventions  Electrical Stimulation;DME Instruction;Functional mobility training;Therapeutic activities;Therapeutic  exercise;Neuromuscular re-education;Patient/family education;Manual techniques;Manual lymph drainage;Compression bandaging;Passive range of motion;Taping;Vasopneumatic Device    PT Next Visit Plan  MLD to the R side with a focus on the R posterior upper quadrant    PT Home Exercise Plan  Access Code: [email or web address]    Recommended Other Services  [employer] Andrea shirt    Consulted and Agree with Plan of Care  Patient       Patient will benefit from skilled therapeutic intervention in order to improve the following deficits and impairments:  Increased edema  Visit Diagnosis: Postmastectomy lymphedema     Problem List Patient Active Problem List   Diagnosis Date Noted  . Neutropenic fever ([place]) 07/06/2017  . Normocytic anemia 07/06/2017  . Febrile neutropenia ([place]) 05/14/2017  . Malignant neoplasm of lower-outer quadrant of right breast of female, estrogen receptor positive ([place]) 03/17/2017  . Postpartum care following vaginal delivery (8/3) 03/15/2016  . Pregnancy 03/14/2016  .  Normal labor 03/14/2016  . Rh negative status during pregnancy 03/14/2016    Ander Purpura, PT 07/22/2019, [place], [place], [address] Phone: [phone]   Fax:  [phone]  Name: ARDELLA NABARRO MRN: [id number] Date of Birth: Oct 14, 1980

## 2019-07-22 NOTE — Patient Instructions (Signed)
Access Code: [facility]  URL: [email or web address]  Date: 07/22/2019  Prepared by: Tomma Rakers   Exercises Standing Upper Cervical Flexion and Extension - 20 reps - 1 sets - 1x daily - 7x weekly Standing Cervical Sidebending AROM - 20 reps - 1 sets - 1x daily - 7x weekly Standing Cervical Rotation AROM - 20 reps - 1 sets - 1x daily - 7x weekly Standing Shoulder Flexion Full Range - 20 reps - 1 sets - 1x daily - 7x weekly Standing Shoulder Shrugs - 20 reps - 1 sets - 1x daily - 7x weekly Standing Scapular Retraction - 20 reps - 1 sets - 1x daily - 7x weekly Trunk Sidebending with Compression Garment - 20 reps - 1 sets - 1x daily - 7x weekly Standing Hip Extension with Counter Support - 20 reps - 1 sets - 1x daily - 7x weekly Elbow AROM Flexion & Extension Supinated Forearm - 20 reps - 1 sets - 1x daily - 7x weekly Wrist AROM Flexion Extension - 20 reps - 1 sets - 1x daily - 7x weekly Hand Fist Pumps - 20 reps - 1 sets - 1x daily - 7x weekly

## 2019-07-27 ENCOUNTER — Ambulatory Visit: Payer: BC Managed Care – PPO

## 2019-07-27 ENCOUNTER — Other Ambulatory Visit: Payer: Self-pay

## 2019-07-27 DIAGNOSIS — M25611 Stiffness of right shoulder, not elsewhere classified: Secondary | ICD-10-CM

## 2019-07-27 DIAGNOSIS — M6281 Muscle weakness (generalized): Secondary | ICD-10-CM

## 2019-07-27 DIAGNOSIS — R293 Abnormal posture: Secondary | ICD-10-CM

## 2019-07-27 DIAGNOSIS — I972 Postmastectomy lymphedema syndrome: Secondary | ICD-10-CM

## 2019-07-27 DIAGNOSIS — C50511 Malignant neoplasm of lower-outer quadrant of right female breast: Secondary | ICD-10-CM

## 2019-07-27 NOTE — Therapy (Signed)
[place], [place], [address] Phone: [phone]   Fax:  [phone]  Physical Therapy Treatment  Patient Details  Name: Ana Wu MRN: [id number] Date of Birth: 03/05/1981 Referring Provider (PT): Cyndie Mull, PT   Encounter Date: 07/27/2019  PT End of Session - 07/27/19 1609    Visit Number  2    Number of Visits  5    Date for PT Re-Evaluation  09/02/19    PT Start Time  [id number]    PT Stop Time  1645    PT Time Calculation (min)  40 min    Activity Tolerance  Patient tolerated treatment well    Behavior During Therapy  [facility] for tasks assessed/performed       Past Medical History:  Diagnosis Date  . Cancer ([name]) 02/2017   Breast Right     Past Surgical History:  Procedure Laterality Date  . BREAST FIBROADENOMA SURGERY     2001, 2007  . PORTACATH PLACEMENT Left 03/27/2017   Procedure: INSERTION PORT-A-CATH;  Surgeon: Stark Klein, MD;  Location: [place];  Service: General;  Laterality: Left;  . WISDOM TOOTH EXTRACTION      There were no vitals filed for this visit.  Subjective Assessment - 07/27/19 1609    Subjective  Pt states that Ana Wu with [employer] has already been out to her house and adjusted her pump. She did it last night with the new adjustments and states her her RUE feels much better.    Pertinent History  July 31st, 2020 laparascopic total hysterectomy and Rt mastectomy with total reconstruction on 09/14/2017, then Lt mastectomy with totaly reconstruction surgery on 08/03/2018, both were DIEP flaps. She took chemo therapy and finished in April of 2019.    Patient Stated Goals  I want to find out what is going on in my back if it is lymphedema and how to treat it.    Currently in Pain?  No/denies    Multiple Pain Sites  No                  [facility]patient [facility]  Lymphedema Life Impact Scale Total Score   8.82 %           [facility] Adult PT Treatment/Exercise - 07/27/19 0001      Manual Therapy   Manual Therapy  Edema management;Manual Lymphatic Drainage (MLD)    Edema Management  Pt was provided with 1/2 inch gray foam for R axillary area to the posterior inter-axillary anatsomosis on the R to decresae fluid build up in this area.     Manual Lymphatic Drainage (MLD)  In supine: short neck, 5 diaphragmatic breathes, swimming in the terminus, Bil shoulders, Bil axillary and inguinal nodes, Bil axillo-inguinal anastomosis, L brachium medial to lateral, L lateral brachium, all sufaces of the L antebrachium and dorsum of the hand. In side-lying: R axillo ingunal anastomosis, posterior inter-axillary anastomosis with extra time spent here, down R anastomosis diverting lymphatic flow to R buttocks, in supine re-worked all surfaces, deep abdominals             PT Education - 07/27/19 1649    Education Details  Pt will wear gray foam as tolerated. She will continue with pump as suggested by tactilemedical representative with new program and wearing compression. She has received her compression cami and will be wearing that as well.    Person(s) Educated  Patient  Methods  Explanation    Comprehension  Verbalized understanding       PT Short Term Goals - 07/22/19 0902      PT SHORT TERM GOAL #1   Title  Pt will be independent with initial HEP in order to demonstrate autonomy of care.    Baseline  Pt does not have lymphatic facilitation exercises.    Time  2    Period  Weeks    Status  New    Target Date  08/12/19        PT Long Term Goals - 07/22/19 0902      PT LONG TERM GOAL #1   Title  Pt will not have redness over her L scapular area with visually reduced edema with less than 10% difference when compared to the L    Baseline  25% difference from R to L in sitting with correct posture    Time  4    Period  Weeks    Status  New    Target Date  08/26/19      PT LONG TERM  GOAL #2   Title  Pt will receive an appropriate compression garment within 4 weeks in order to address R upper quadrant lymphdema    Baseline  Pt currently does not have compression bras at this time or compression shirt due to back up at DME store due to covid 19    Time  4    Period  Weeks    Status  New    Target Date  08/26/19            Plan - 07/27/19 1608    Clinical Impression Statement  Pt presents today with continued ruborous area in the R posterior upper quadrant near the axilla. MLD was performed to direct fluid away from this area and around previous scarring from hysterectomy. Pt was provided with 1/2 inch gray foam to wear from the R axilla to the R posterior inter-axillary anastomosis in order to decrease edema in this area along with her compression cami that she recieved the other day. Pt will benefit from continued POC at this time.    Personal Factors and Comorbidities  Comorbidity 1    Comorbidities  Bil mastecomy w/reconstruction    Rehab Potential  Excellent    PT Frequency  1x / week    PT Duration  4 weeks    PT Treatment/Interventions  Electrical Stimulation;DME Instruction;Functional mobility training;Therapeutic activities;Therapeutic exercise;Neuromuscular re-education;Patient/family education;Manual techniques;Manual lymph drainage;Compression bandaging;Passive range of motion;Taping;Vasopneumatic Device    PT Next Visit Plan  assess gray foam and MLD , Continue with MLD to the R side with a focus on the R posterior upper quadrant    PT Home Exercise Plan  Access Code: [email or web address]    Consulted and Agree with Plan of Care  Patient       Patient will benefit from skilled therapeutic intervention in order to improve the following deficits and impairments:  Increased edema  Visit Diagnosis: Postmastectomy lymphedema  Malignant neoplasm of lower-outer quadrant of right breast of female, estrogen receptor positive (HCC)  Abnormal posture  Stiffness of right  shoulder, not elsewhere classified  Muscle weakness (generalized)     Problem List Patient Active Problem List   Diagnosis Date Noted  . Neutropenic fever ([place]) 07/06/2017  . Normocytic anemia 07/06/2017  . Febrile neutropenia ([place]) 05/14/2017  . Malignant neoplasm of lower-outer quadrant of right breast of female, estrogen receptor positive ([place]) 03/17/2017  .  Postpartum care following vaginal delivery (8/3) 03/15/2016  . Pregnancy 03/14/2016  . Normal labor 03/14/2016  . Rh negative status during pregnancy 03/14/2016    Ander Purpura, PT 07/27/2019, 4:52 PM  [place], [place], [address] Phone: [phone]   Fax:  [phone]  Name: Ana Wu MRN: [id number] Date of Birth: Apr 25, 1981

## 2019-08-03 ENCOUNTER — Other Ambulatory Visit: Payer: Self-pay

## 2019-08-03 ENCOUNTER — Ambulatory Visit: Payer: BC Managed Care – PPO

## 2019-08-03 DIAGNOSIS — M6281 Muscle weakness (generalized): Secondary | ICD-10-CM

## 2019-08-03 DIAGNOSIS — R293 Abnormal posture: Secondary | ICD-10-CM

## 2019-08-03 DIAGNOSIS — C50511 Malignant neoplasm of lower-outer quadrant of right female breast: Secondary | ICD-10-CM

## 2019-08-03 DIAGNOSIS — I972 Postmastectomy lymphedema syndrome: Secondary | ICD-10-CM | POA: Diagnosis not present

## 2019-08-03 DIAGNOSIS — Z170 Estrogen receptor positive status [ER+]: Secondary | ICD-10-CM

## 2019-08-03 DIAGNOSIS — M25611 Stiffness of right shoulder, not elsewhere classified: Secondary | ICD-10-CM

## 2019-08-03 NOTE — Therapy (Signed)
[place], [place], [address] Phone: [phone]   Fax:  [phone]  Physical Therapy Treatment  Patient Details  Name: RIANE ARCEO MRN: [id number] Date of Birth: 05/07/1981 Referring Provider (PT): Cyndie Mull, PT   Encounter Date: 08/03/2019  PT End of Session - 08/03/19 1603    Visit Number  3    Number of Visits  5    Date for PT Re-Evaluation  09/02/19    PT Start Time  1602    PT Stop Time  1645    PT Time Calculation (min)  43 min    Activity Tolerance  Patient tolerated treatment well    Behavior During Therapy  [facility] for tasks assessed/performed       Past Medical History:  Diagnosis Date  . Cancer ([place]) 02/2017   Breast Right     Past Surgical History:  Procedure Laterality Date  . BREAST FIBROADENOMA SURGERY     2001, 2007  . PORTACATH PLACEMENT Left 03/27/2017   Procedure: INSERTION PORT-A-CATH;  Surgeon: Stark Klein, MD;  Location: [facility];  Service: General;  Laterality: Left;  . WISDOM TOOTH EXTRACTION      There were no vitals filed for this visit.  Subjective Assessment - 08/03/19 1603    Subjective  Pt reports that she has been doing her longer treatment with her pump occasionally and that she has been wraing the gray foam. She reports that she believes that her back is getting a little better and feeling much better.    Pertinent History  July 31st, 2020 laparascopic total hysterectomy and Rt mastectomy with total reconstruction on 09/14/2017, then Lt mastectomy with totaly reconstruction surgery on 08/03/2018, both were DIEP flaps. She took chemo therapy and finished in April of 2019.    Patient Stated Goals  I want to find out what is going on in my back if it is lymphedema and how to treat it.    Currently in Pain?  No/denies    Multiple Pain Sites  No                  Outpatient Re[facility]   Lymphedema Life Impact Scale Total Score  8.82 %           [facility] Adult PT Treatment/Exercise - 08/03/19 0001      Manual Therapy   Manual Therapy  Manual Lymphatic Drainage (MLD)    Edema Management  Pt was visually inspected for improvement in edema in the posterior R upper quadrant with BIl even folds she had a slightly larger fold that felt edematous on the L lateral lower quadrant near scar from re-construction surgery for muscle flap that this therapist discussed with pt including wearing compressive leg garments to see if this doesn't help.    Manual Lymphatic Drainage (MLD)  In Supine: short neck, 5 diaphragmatic breathes, swimming in the terminus, Bil shoulders, Axillary and inguinal lymph nodes, BIl axillo-inguinal anastomosis, Posterior inter-axillary anastomosis in L side lying, R axillo-inguinal anastomosis, figure 7 to the R axillo-inguinal anastomosis form mid-line in prone/frog legged, In supine: re-traced all steps, Deep abodminals.              PT Education - 08/03/19 1647    Education Details  Pt will wear her compression cami/longer vest bra and some compression leggings that she has for compression at her and lower abdominal quadrants. Dicussed the possibility of edemain her L lower  quadrant related to her reconstruction surgery. Discussed POC and possible discharge next week. Pt will [facility] to check on status of her glove and second sleeve.    Person(s) Educated  Patient    Methods  Explanation    Comprehension  Verbalized understanding       PT Short Term Goals - 07/22/19 0902      PT SHORT TERM GOAL #1   Title  Pt will be independent with initial HEP in order to demonstrate autonomy of care.    Baseline  Pt does not have lymphatic facilitation exercises.    Time  2    Period  Weeks    Status  New    Target Date  08/12/19        PT Long Term Goals - 07/22/19 0902      PT LONG TERM GOAL #1   Title  Pt will not have redness over her L  scapular area with visually reduced edema with less than 10% difference when compared to the L    Baseline  25% difference from R to L in sitting with correct posture    Time  4    Period  Weeks    Status  New    Target Date  08/26/19      PT LONG TERM GOAL #2   Title  Pt will receive an appropriate compression garment within 4 weeks in order to address R upper quadrant lymphdema    Baseline  Pt currently does not have compression bras at this time or compression shirt due to back up at DME store due to covid 19    Time  4    Period  Weeks    Status  New    Target Date  08/26/19            Plan - 08/03/19 1602    Clinical Impression Statement  Pt presents today with improved color on her R posterior upper quadrant. MLD was performed today in the anteiror and posterior trunk with a focus on the R upper quadrant. On inspection pt has equal folding on the Bil posterior upper quadrants, slight increase in protuberance that feels edematous was noted at the L lower quadrant located right above area of incision for abdominal flap for reconstructive surgery.Discussed wearing compression in this area and longer compressive bra at this time while continueing 2x/week for longer program on vasopneumatic pump. Pt will benefit from continued POC at this time.    Personal Factors and Comorbidities  Comorbidity 1    Comorbidities  Bil mastecomy w/reconstruction    Stability/Clinical Decision Making  Stable/Uncomplicated    Rehab Potential  Excellent    PT Frequency  1x / week    PT Duration  4 weeks    PT Treatment/Interventions  Electrical Stimulation;DME Instruction;Functional mobility training;Therapeutic activities;Therapeutic exercise;Neuromuscular re-education;Patient/family education;Manual techniques;Manual lymph drainage;Compression bandaging;Passive range of motion;Taping;Vasopneumatic Device    PT Next Visit Plan  assess compressive leggings on the L lower quadrant and MLD , Continue with  MLD to the R side with a focus on the R posterior upper quadrant    PT Home Exercise Plan  Access Code: [email or web address]    Recommended Other Services  [employer] Andrea shirt    Consulted and Agree with Plan of Care  Patient       Patient will benefit from skilled therapeutic intervention in order to improve the following deficits and impairments:  Increased edema  Visit Diagnosis: Postmastectomy lymphedema  Malignant neoplasm of lower-outer quadrant of right breast of female, estrogen receptor positive (HCC)  Abnormal posture  Stiffness of right shoulder, not elsewhere classified  Muscle weakness (generalized)     Problem List Patient Active Problem List   Diagnosis Date Noted  . Neutropenic fever ([place]) 07/06/2017  . Normocytic anemia 07/06/2017  . Febrile neutropenia ([place]) 05/14/2017  . Malignant neoplasm of lower-outer quadrant of right breast of female, estrogen receptor positive ([place]) 03/17/2017  . Postpartum care following vaginal delivery (8/3) 03/15/2016  . Pregnancy 03/14/2016  . Normal labor 03/14/2016  . Rh negative status during pregnancy 03/14/2016    Ander Purpura, PT 08/03/2019, 4:59 PM  [place], [place], [address] Phone: [phone]   Fax:  [phone]  Name: CHERRIL SHAWN MRN: [id number] Date of Birth: [birth year]

## 2019-08-10 ENCOUNTER — Other Ambulatory Visit: Payer: Self-pay

## 2019-08-10 ENCOUNTER — Ambulatory Visit: Payer: BC Managed Care – PPO

## 2019-08-10 DIAGNOSIS — I972 Postmastectomy lymphedema syndrome: Secondary | ICD-10-CM

## 2019-08-10 DIAGNOSIS — M6281 Muscle weakness (generalized): Secondary | ICD-10-CM

## 2019-08-10 DIAGNOSIS — Z170 Estrogen receptor positive status [ER+]: Secondary | ICD-10-CM

## 2019-08-10 DIAGNOSIS — R293 Abnormal posture: Secondary | ICD-10-CM

## 2019-08-10 DIAGNOSIS — C50511 Malignant neoplasm of lower-outer quadrant of right female breast: Secondary | ICD-10-CM

## 2019-08-10 DIAGNOSIS — M25611 Stiffness of right shoulder, not elsewhere classified: Secondary | ICD-10-CM

## 2019-08-10 NOTE — Therapy (Signed)
[place], [place], [address] Phone: [phone]   Fax:  [phone]  Physical Therapy Discharge Note  Patient Details  Name: Ana Wu MRN: [id number] Date of Birth: 05/01/1981 Referring Provider (PT): Cyndie Mull, PT   Encounter Date: 08/10/2019  PT End of Session - 08/10/19 1606    Visit Number  4    Number of Visits  5    Date for PT Re-Evaluation  09/02/19    PT Start Time  3299    PT Stop Time  1649    PT Time Calculation (min)  44 min    Activity Tolerance  Patient tolerated treatment well    Behavior During Therapy  [facility] for tasks assessed/performed       Past Medical History:  Diagnosis Date  . Cancer ([place]) 02/2017   Breast Right     Past Surgical History:  Procedure Laterality Date  . BREAST FIBROADENOMA SURGERY     2001, 2007  . PORTACATH PLACEMENT Left 03/27/2017   Procedure: INSERTION PORT-A-CATH;  Surgeon: Stark Klein, MD;  Location: [place];  Service: General;  Laterality: Left;  . WISDOM TOOTH EXTRACTION      There were no vitals filed for this visit.  Subjective Assessment - 08/10/19 1606    Subjective  Pt reports that she tried wearing compression leggings but did not notice much change. She states that she was walking every day after work but due to the days getting shorter has not been walking which she thinks may contribute to her increased edema. Pt reports that she tried not to wear her compression bra over the weekend and wore a tank top that caused red irritation under her under arm. She tried the new program on the pump and it feels and looks much better today.    Pertinent History  July 31st, 2020 laparascopic total hysterectomy and Rt mastectomy with total reconstruction on 09/14/2017, then Lt mastectomy with totaly reconstruction surgery on 08/03/2018, both were DIEP flaps. She took chemo therapy and finished in April of 2019.    Patient Stated Goals  I want to  find out what is going on in my back if it is lymphedema and how to treat it.    Currently in Pain?  No/denies    Multiple Pain Sites  No            LYMPHEDEMA/ONCOLOGY QUESTIONNAIRE - 08/10/19 1638      Lymphedema Stage   Stage  STAGE 2 SPONTANEOUSLY IRREVERSIBLE      Lymphedema Assessments   Lymphedema Assessments  Upper extremities      Right Upper Extremity Lymphedema   15 cm Proximal to Olecranon Process  33.7 cm    10 cm Proximal to Olecranon Process  33.1 cm    Olecranon Process  25.9 cm    15 cm Proximal to Ulnar Styloid Process  26.8 cm    10 cm Proximal to Ulnar Styloid Process  24 cm    Just Proximal to Ulnar Styloid Process  15.6 cm    Across Hand at [employer]  17.3 cm    At [place] of 2nd Digit  5.7 cm           Outpatient Re[facility] from [facility]/22/[facility]  Lymphedema Life Impact Scale Total Score  8.82 %           [facility] Adult PT Treatment/Exercise - 08/10/19 0001      Manual  Therapy   Manual Therapy  Manual Lymphatic Drainage (MLD)    Manual Lymphatic Drainage (MLD)  In Supine: short neck, 5 diaphragmatic breathes, swimming in the terminus, Bil shoulders, Axillary and inguinal lymph nodes, BIl axillo-inguinal anastomosis, Posterior inter-axillary anastomosis in L side lying, R axillo-inguinal anastomosis, figure 7 to the R axillo-inguinal anastomosis form mid-line in prone/frog legged, In supine: re-traced all steps, Deep abodminals.              PT Education - 08/10/19 1618    Education Details  Pt will continue to wear her compression bra consistently for a few months and then if she does wear another type of shirt/compression she will make sure that there are no wrinkles and will spot check every so often throughout the day in order to prevent wrinkles. She will continue to use the longer program on her pump 2x/week or before she does any rigorous activity or wears another under garment besides her  compression bra.    Person(s) Educated  Patient    Methods  Explanation    Comprehension  Verbalized understanding       PT Short Term Goals - 08/10/19 1636      PT SHORT TERM GOAL #1   Title  Pt will be independent with initial HEP in order to demonstrate autonomy of care.    Baseline  Pt reports that she is performing her lymphatic facilitation exercises every other day.    Status  Achieved        PT Long Term Goals - 08/10/19 1637      PT LONG TERM GOAL #1   Title  Pt will not have redness over her L scapular area with visually reduced edema with less than 10% difference when compared to the L    Baseline  pt looks equal bilaterally with skin folds from R to L on visual inspection    Status  Achieved      PT LONG TERM GOAL #2   Title  Pt will receive an appropriate compression garment within 4 weeks in order to address R upper quadrant lymphdema    Baseline  Pt has received her compression bra and is wearing it consistently with good results.    Status  Achieved            Plan - 08/10/19 1606    Clinical Impression Statement  Pt presents to physical therapy today with no rubor noted on her R posterior upper quadrant. She has met all of her goals and her RUE circumferential measurements have improved demonstrating less congestion in her R upper quadrant overall. MLD was performed for the anterior andposteroir trunk with a focus on the R upper quadrant. Discussed how to wear compression and when to wear compression/use vasopneumatic pump. Pt will be discharged at this time.    Personal Factors and Comorbidities  Comorbidity 1    Comorbidities  Bil mastecomy w/reconstruction    PT Frequency  1x / week    PT Duration  4 weeks    PT Treatment/Interventions  Electrical Stimulation;DME Instruction;Functional mobility training;Therapeutic activities;Therapeutic exercise;Neuromuscular re-education;Patient/family education;Manual techniques;Manual lymph drainage;Compression  bandaging;Passive range of motion;Taping;Vasopneumatic Device    PT Next Visit Plan  Pt will be discharged at this time.    PT Home Exercise Plan  Access Code: [email or web address]    Consulted and Agree with Plan of Care  Patient       Patient will benefit from skilled therapeutic intervention in order to improve the following deficits  and impairments:  Increased edema  Visit Diagnosis: Malignant neoplasm of lower-outer quadrant of right breast of female, estrogen receptor positive ([place])  Postmastectomy lymphedema  Abnormal posture  Stiffness of right shoulder, not elsewhere classified  Muscle weakness (generalized)     Problem List Patient Active Problem List   Diagnosis Date Noted  . Neutropenic fever ([place]) 07/06/2017  . Normocytic anemia 07/06/2017  . Febrile neutropenia ([place]) 05/14/2017  . Malignant neoplasm of lower-outer quadrant of right breast of female, estrogen receptor positive ([place]) 03/17/2017  . Postpartum care following vaginal delivery (8/3) 03/15/2016  . Pregnancy 03/14/2016  . Normal labor 03/14/2016  . Rh negative status during pregnancy 03/14/2016    PHYSICAL THERAPY DISCHARGE SUMMARY  Plan: Patient agrees to discharge.  Patient goals were not met. Patient is being discharged due to meeting the stated rehab goals.  ?????      Ander Purpura, PT 08/10/2019, 4:52 PM  [place], [place], [address] Phone: [phone]   Fax:  [phone]  Name: Ana Wu MRN: [id number] Date of Birth: 02/05/1981

## 2020-09-11 ENCOUNTER — Telehealth: Payer: Self-pay | Admitting: Physical Therapy

## 2020-09-11 NOTE — Telephone Encounter (Signed)
Ana Wu phoned our clinic asking where to get new compression sleeves. I recommended [facility] as it is the only local place. She stated she wasn't happy with their service. I offered for her to come and be measured by the [facility] fitter so she is scheduled for 09/15/2020 at 11:00. She stated she has the prescription so she will fax that to [place]. Info sent to [facility] for pre-auth. Annia Friendly, [place] 09/11/20 4:34 PM

## 2023-07-31 ENCOUNTER — Encounter: Payer: Self-pay | Admitting: Physical Therapy

## 2023-09-10 NOTE — Therapy (Addendum)
OUTPATIENT PHYSICAL THERAPY  UPPER EXTREMITY ONCOLOGY EVALUATION  Patient Name: Ana Wu MRN: [id number] DOB:02/02/1981, 43 y.o., female Today's Date: 09/11/2023  END OF SESSION:  PT End of Session - 09/11/23 0954     Visit Number 1    Number of Visits 8    Date for PT Re-Evaluation 11/06/23    PT Start Time 0905    PT Stop Time 0950    PT Time Calculation (min) 45 min    Activity Tolerance Patient tolerated treatment well    Behavior During Therapy [facility] for tasks assessed/performed             Past Medical History:  Diagnosis Date   Cancer (HCC) 02/2017   Breast Right    Past Surgical History:  Procedure Laterality Date   BREAST FIBROADENOMA SURGERY     2001, 2007   PORTACATH PLACEMENT Left 03/27/2017   Procedure: INSERTION PORT-A-CATH;  Surgeon: Almond Lint, MD;  Location: [facility] OR;  Service: General;  Laterality: Left;   WISDOM TOOTH EXTRACTION     Patient Active Problem List   Diagnosis Date Noted   Neutropenic fever (HCC) 07/06/2017   Normocytic anemia 07/06/2017   Febrile neutropenia (HCC) 05/14/2017   Malignant neoplasm of lower-outer quadrant of right breast of female, estrogen receptor positive (HCC) 03/17/2017   Postpartum care following vaginal delivery (8/3) 03/15/2016   Pregnancy 03/14/2016   Normal labor 03/14/2016   Rh negative status during pregnancy 03/14/2016    PCP: Lodema Pilot, PA  REFERRING PROVIDER: Ranelle Oyster  REFERRING DIAG: Right UE lymphedema  THERAPY DIAG:  Lymphedema, not elsewhere classified  ONSET DATE: 2019  Rationale for Evaluation and Treatment: Rehabilitation  SUBJECTIVE:                                                                                                                                                                                           SUBJECTIVE STATEMENT:  I have been wearing garments but I am overdue for those. I have had my pump Flexi touch for a while and the velcro is worn out. I  accidently deleted the longer program. I feel like my arm is doing pretty well. I have a swollen area on my right neck. I had it [place] and everything was fine. I have some water in my ears too. Do you think that is lymphedema?   PERTINENT HISTORY:  July 31st, 2020 laparascopic total hysterectomy and Rt mastectomy with total reconstruction on 09/14/2017, then Lt mastectomy with totaly reconstruction surgery on 08/03/2018, both were DIEP flaps. She took chemo therapy and finished in April of 2019.  PAIN:  Are you  having pain? No  PRECAUTIONS: right UE lymphedema  RED FLAGS: None   WEIGHT BEARING RESTRICTIONS: No  FALLS:  Has patient fallen in last 6 months? No  LIVING ENVIRONMENT: Lives with: lives with their family, husband and 71 year old daughter Lives in: House/apartment    OCCUPATION: school counselor at [employer]  LEISURE: Dance movement psychotherapist, yardwork, hiking  HAND DOMINANCE: right   PRIOR LEVEL OF FUNCTION: Independent  PATIENT GOALS: Check swelling, get new garments, try to get velcro on Flexi touch checked   OBJECTIVE: Note: Objective measures were completed at Evaluation unless otherwise noted.  COGNITION: Overall cognitive status: Within functional limits for tasks assessed   PALPATION: Arm soft without fibrosis  OBSERVATIONS / OTHER ASSESSMENTS: no observable truncal swelling today.  SENSATION: Light touch: Deficits     POSTURE: forward head, rounded shoulders  UPPER EXTREMITY AROM/PROM:  A/PROM RIGHT   eval   Shoulder extension   Shoulder flexion 156  Shoulder abduction 180  Shoulder internal rotation   Shoulder external rotation     (Blank rows = not tested)  A/PROM LEFT   eval  Shoulder extension   Shoulder flexion 160  Shoulder abduction 180  Shoulder internal rotation   Shoulder external rotation     (Blank rows = not tested)  CERVICAL AROM: All within functional limits:     UPPER EXTREMITY STRENGTH:   LYMPHEDEMA  ASSESSMENTS:   SURGERY TYPE/DATE:  Rt mastectomy with total reconstruction on 09/14/2017, then Lt mastectomy with total reconstruction surgery on 08/03/2018, both were DIEP flaps. BRCA1 mutation  NUMBER OF LYMPH NODES REMOVED: 0/7  Right  CHEMOTHERAPY: YES  RADIATION:11/2017  HORMONE TREATMENT: Letrozole 2019  INFECTIONS: Yes Right UE   LYMPHEDEMA ASSESSMENTS:   LANDMARK RIGHT  eval  At axilla  37.0  15 cm proximal to olecranon process 34.3  10 cm proximal to olecranon process 33.0  Olecranon process 25.8  15 cm proximal to ulnar styloid process 27.2  10 cm proximal to ulnar styloid process 24.4  Just proximal to ulnar styloid process 15.7  Across hand at thumb web space 19.5  At base of 2nd digit 5.8  (Blank rows = not tested)  LANDMARK LEFT  eval  At axilla  37.9  15 cm proximal to olecranon process 36.2  10 cm proximal to olecranon process 34.3  Olecranon process 28.7  15 cm proximal to ulnar styloid process 27.5  10 cm proximal to ulnar styloid process 23.7  Just proximal to ulnar styloid process 16.0  Across hand at thumb web space 18.7  At base of 2nd digit 5.8  (Blank rows = not tested)   FUNCTIONAL TESTS:    GAIT: WNL    Lymphedema Life Impact Scale:13%                                                                                                                           TREATMENT DATE:  09/11/2023 Discussed Flexi touch and gave pt Leah's card  to ask her questions about programming her pump and velcro that is coming loose. Placed pt on schedule to be measured by Rodman Pickle on Feb 4 for custom sleeve and glove. Showed pt night garments but she is not interested at this time. Demographics, script from [facility] on clip board.   PATIENT EDUCATION:  Education details: sleeve, glove, showed night garments Person educated: Patient Education method: Explanation Education comprehension: verbalized understanding  HOME EXERCISE  PROGRAM:   ASSESSMENT:  CLINICAL IMPRESSION: Patient is a 43 y.o. female who was seen today for physical therapy evaluation and treatment for evaluation of right UE lymphedema. She is managing very well with the right UE in many cases smaller than the left. She has excellent shoulder ROM. The arm is soft and there is no visible truncal swelling today. She needs to get new compression garments and she was set up to be measured by Rodman Pickle on Feb. 4. She requested information for [facility] so she may discuss an upgrade to her pump, or refurbishing of velcro on her suit. She does not require treatment at this time as she is independent with her garments and Flexi touch and is doing well, but she may require several visits to check her new compression garments when they arrive. 09/16/2023 Addendum: Pt fit for compression garments to control Right UE lymphedema; She requires Mediven 550 Cl 2 (23-32 mmHg) Arm sleeve with elbow flexure zone for comfort and 5 cm silicone band to help keep sleeve up, and Mediven 550 glove with open fingers. She would like 1 set (sleeve and glove in Black, and 1 set sleeve and glove in light blue)to manage her Right UE lymphedema which has been present since 2019 and requires regular daily compression, and use of her compression pump to control.   OBJECTIVE IMPAIRMENTS: decreased knowledge of condition, increased edema, and postural dysfunction.   ACTIVITY LIMITATIONS:  occasionally limited with home and leisure activities  PARTICIPATION LIMITATIONS: yard work  PERSONAL FACTORS: 1-2 comorbidities: bilateral Mastectomies s/p Diep Flap and lymphedema  are also affecting patient's functional outcome.   REHAB POTENTIAL: Excellent  CLINICAL DECISION MAKING: Stable/uncomplicated  EVALUATION COMPLEXITY: Low  GOALS: Goals reviewed with patient? Yes  SHORT TERM GOALS: Target date: 11/06/2023  Pt will communicate with [employer] about her concern of velcro losing its  stick and needing a longer treatment programmed Baseline: Goal status: INITIAL  2.  Pt will receive proper fitting compression garments for lymphedema management Baseline:  Goal status: INITIAL  3.  Pt will continue to be independent in self management of Lymphedema Baseline:  Goal status: INITIAL   PLAN:  PT FREQUENCY: up to  8 visits prn  PT DURATION: 8 weeks  PLANNED INTERVENTIONS: 97164- PT Re-evaluation, 97110-Therapeutic exercises, 97535- Self Care, 40981- Manual therapy, 97760- Orthotic Fit/training, and Patient/Family education  PLAN FOR NEXT SESSION: assess garments when received. Did she speak with [employer] [facility]?  Waynette Buttery, PT 09/11/2023, 12:58 PM

## 2023-09-11 ENCOUNTER — Other Ambulatory Visit: Payer: Self-pay

## 2023-09-11 ENCOUNTER — Ambulatory Visit: Payer: 59 | Attending: Physician Assistant

## 2023-09-11 DIAGNOSIS — I972 Postmastectomy lymphedema syndrome: Secondary | ICD-10-CM | POA: Diagnosis present

## 2023-09-11 DIAGNOSIS — I890 Lymphedema, not elsewhere classified: Secondary | ICD-10-CM | POA: Diagnosis present

## 2023-11-06 ENCOUNTER — Ambulatory Visit: Attending: Physician Assistant

## 2023-11-06 DIAGNOSIS — I890 Lymphedema, not elsewhere classified: Secondary | ICD-10-CM | POA: Insufficient documentation

## 2023-11-06 DIAGNOSIS — I972 Postmastectomy lymphedema syndrome: Secondary | ICD-10-CM | POA: Diagnosis present

## 2023-11-06 NOTE — Therapy (Addendum)
 OUTPATIENT PHYSICAL THERAPY  UPPER EXTREMITY ONCOLOGY TREATMENT  Patient Name: Ana Wu MRN: [id number] DOB:Dec 23, 1980, 43 y.o., female Today's Date: 11/06/2023  END OF SESSION:  PT End of Session - 11/06/23 0829     Visit Number 2    Number of Visits 8    Date for PT Re-Evaluation 11/06/23    PT Start Time 0803    PT Stop Time 0850    PT Time Calculation (min) 47 min    Activity Tolerance Patient tolerated treatment well    Behavior During Therapy [facility] for tasks assessed/performed             Past Medical History:  Diagnosis Date   Cancer (HCC) 02/2017   Breast Right    Past Surgical History:  Procedure Laterality Date   BREAST FIBROADENOMA SURGERY     2001, 2007   PORTACATH PLACEMENT Left 03/27/2017   Procedure: INSERTION PORT-A-CATH;  Surgeon: Aron Shoulders, MD;  Location: [facility] OR;  Service: General;  Laterality: Left;   WISDOM TOOTH EXTRACTION     Patient Active Problem List   Diagnosis Date Noted   Neutropenic fever (HCC) 07/06/2017   Normocytic anemia 07/06/2017   Febrile neutropenia (HCC) 05/14/2017   Malignant neoplasm of lower-outer quadrant of right breast of female, estrogen receptor positive (HCC) 03/17/2017   Postpartum care following vaginal delivery (8/3) 03/15/2016   Pregnancy 03/14/2016   Normal labor 03/14/2016   Rh negative status during pregnancy 03/14/2016    PCP: Tully Gosling, PA  REFERRING PROVIDER: [facility]  REFERRING DIAG: Right UE lymphedema  THERAPY DIAG:  Lymphedema, not elsewhere classified  Post-mastectomy lymphedema syndrome  ONSET DATE: 2019  Rationale for Evaluation and Treatment: Rehabilitation  SUBJECTIVE:                                                                                                                                                                                           SUBJECTIVE STATEMENT:  I got my new sleeve and overall it fits good except feels just a but tight at the end of the  day. I haven't called Leah at [employer] yet but will do that soon. I think my hand is swelling but the glove was really expensive and my insurance will only cover $3 of my sleeves!   PERTINENT HISTORY:  July 31st, 2020 laparascopic total hysterectomy and Rt mastectomy with total reconstruction on 09/14/2017, then Lt mastectomy with totaly reconstruction surgery on 08/03/2018, both were DIEP flaps. She took chemo therapy and finished in April of 2019.  PAIN:  Are you having pain? No  PRECAUTIONS: right UE lymphedema  RED FLAGS: None  WEIGHT BEARING RESTRICTIONS: No  FALLS:  Has patient fallen in last 6 months? No  LIVING ENVIRONMENT: Lives with: lives with their family, husband and 8 year old daughter Lives in: House/apartment    OCCUPATION: school counselor at [employer]  LEISURE: Dance movement psychotherapist, yardwork, hiking  HAND DOMINANCE: right   PRIOR LEVEL OF FUNCTION: Independent  PATIENT GOALS: Check swelling, get new garments, try to get velcro on Flexi touch checked   OBJECTIVE: Note: Objective measures were completed at Evaluation unless otherwise noted.  COGNITION: Overall cognitive status: Within functional limits for tasks assessed   PALPATION: Arm soft without fibrosis  OBSERVATIONS / OTHER ASSESSMENTS: no observable truncal swelling today.  SENSATION: Light touch: Deficits     POSTURE: forward head, rounded shoulders  UPPER EXTREMITY AROM/PROM:  A/PROM RIGHT   eval   Shoulder extension   Shoulder flexion 156  Shoulder abduction 180  Shoulder internal rotation   Shoulder external rotation     (Blank rows = not tested)  A/PROM LEFT   eval  Shoulder extension   Shoulder flexion 160  Shoulder abduction 180  Shoulder internal rotation   Shoulder external rotation     (Blank rows = not tested)  CERVICAL AROM: All within functional limits:     UPPER EXTREMITY STRENGTH:   LYMPHEDEMA ASSESSMENTS:   SURGERY TYPE/DATE:  Rt mastectomy  with total reconstruction on 09/14/2017, then Lt mastectomy with total reconstruction surgery on 08/03/2018, both were DIEP flaps. BRCA1 mutation  NUMBER OF LYMPH NODES REMOVED: 0/7  Right  CHEMOTHERAPY: YES  RADIATION:11/2017  HORMONE TREATMENT: Letrozole 2019  INFECTIONS: Yes Right UE   LYMPHEDEMA ASSESSMENTS:   LANDMARK RIGHT  eval Right 11/06/23  At axilla  37.0 35.9  15 cm proximal to olecranon process 34.3 33.2  10 cm proximal to olecranon process 33.0 31.8  Olecranon process 25.8 25.7  15 cm proximal to ulnar styloid process 27.2 26.8  10 cm proximal to ulnar styloid process 24.4 23.7  Just proximal to ulnar styloid process 15.7 15.8  Across hand at thumb web space 19.5 18.4  At base of 2nd digit 5.8 5.8  (Blank rows = not tested)  LANDMARK LEFT  eval  At axilla  37.9  15 cm proximal to olecranon process 36.2  10 cm proximal to olecranon process 34.3  Olecranon process 28.7  15 cm proximal to ulnar styloid process 27.5  10 cm proximal to ulnar styloid process 23.7  Just proximal to ulnar styloid process 16.0  Across hand at thumb web space 18.7  At base of 2nd digit 5.8  (Blank rows = not tested)   FUNCTIONAL TESTS:    GAIT: WNL    Lymphedema Life Impact Scale:13%                                                                                                                           TREATMENT DATE:  11/06/23: Orthotic Fit and Self Care Pt came wearing her new compression  sleeve so assessed fit of this. Upper arm seems to fit fairly well but agree with pt that it could be a bit too tight, especially as she is reporting it feeling like it's pinching at her medial upper ar at the end of the day. Advised her that when she reorders second sleeve to ask them to add 1-2 cm at upper arm and pt verbalized understanding. Also discussed other compression glove options and measured pt for a [employer], size II. She is starting to notice fullness at her upper arm  as well by end of the day so suggested a velcro compression garment that she could begin sleeping in but also have if she notices any increase in swelling and needs to wear it during the day prn. Pt liked this option so also measured her for a velcro J[employer]o Compression Wrap size small, regular sleeve and small gauntlet. Uploaded these to [employer] pt portal so pt is able to order these at her convenience.  Also educated pt about how to incorporate self MLD after use of her pump across her anterior inter-axillary anastomosis as she reports noticing in creased fullness at her upper arm at end of day now. Pt verbalized very good understanding of this, still has her handouts and reports that she does remember how to do self MLD and does not require review. Pt also reports flying to [place] in June so discussed wear of compression on the flights and that if she notices increased swelling with the increased walking they'll be doing there she can utilize her velcro garment more prn.    09/11/2023 Discussed Flexi touch and gave pt Leah's card to ask her questions about programming her pump and velcro that is coming loose. Placed pt on schedule to be measured by Deidre on Feb 4 for custom sleeve and glove. Showed pt night garments but she is not interested at this time. Demographics, script from [facility] on clip board.   PATIENT EDUCATION:  Education details: sleeve, glove, showed night garments Person educated: Patient Education method: Explanation Education comprehension: verbalized understanding  HOME EXERCISE PROGRAM:   ASSESSMENT:  CLINICAL IMPRESSION: Pt came in wearing her new Mediven 550 sleeve. Overall its a good fit but does seem a bit tight at upper arm, more so per pt report than therapist assessment. Advised pt that when she orders her second sleeve to ask them to add 1-2 cm to the upper arm circumference. We also discussed other compression options, see above. Pt now has [employer] link with  compression options and can order at her convenience. Also suggested though that pt call [employer] to see if they can verify benefits to see if her coverage is different. Also that pt should call and verify this for her self. She verbalized understanding and knows she can call back at any time with any questions.    OBJECTIVE IMPAIRMENTS: decreased knowledge of condition, increased edema, and postural dysfunction.   ACTIVITY LIMITATIONS: occasionally limited with home and leisure activities  PARTICIPATION LIMITATIONS: yard work  PERSONAL FACTORS: 1-2 comorbidities: bilateral Mastectomies s/p Diep Flap and lymphedema are also affecting patient's functional outcome.   REHAB POTENTIAL: Excellent  CLINICAL DECISION MAKING: Stable/uncomplicated  EVALUATION COMPLEXITY: Low  GOALS: Goals reviewed with patient? Yes  SHORT TERM GOALS: Target date: 11/06/2023  Pt will communicate with [employer] about her concern of velcro losing its stick and needing a longer treatment programmed Baseline: 11/06/23 - Pt has contact information for [employer] and will call at  her earliest convenience  Goal status: PARTIALLY MET  2.  Pt will receive proper fitting compression garments for lymphedema management Baseline: 11/06/23 - pt has received her first sleeve and will be ordering the second soon along with a glove, and velcro garments for night Goal status: MET  3.  Pt will continue to be independent in self management of Lymphedema Baseline: 11/06/23 - she uses her pumps 5-7x/week and will be incorporating self MLD after to treat upper arm prn where she reports fullness at end of day Goal status: MET   PLAN:  PT FREQUENCY: up to 8 visits prn  PT DURATION: 8 weeks  PLANNED INTERVENTIONS: 97164- PT Re-evaluation, 97110-Therapeutic exercises, 97535- Self Care, [id number]- Manual therapy, 97760- Orthotic Fit/training, and Patient/Family education  PLAN FOR NEXT SESSION: Pt on hold and if we don't hear  back in next few weeks will D/C.  PHYSICAL THERAPY DISCHARGE SUMMARY  Visits from Start of Care: 2  Current functional level related to goals / functional outcomes: Achieved goals   Remaining deficits: None noted   Education / Equipment: Pt had all information to get compression garments   Patient agrees to discharge. Patient goals were . Patient is being discharged due to achieving goals, and not returning since the last visit.  Aden Berwyn Caldron, PTA 11/06/2023, 1:21 PM Grayce Sheldon, PT 11/06/23 5:22 PM Addendum Grayce Sheldon, PT 02/04/24 12:41 PM

## 2024-04-27 ENCOUNTER — Other Ambulatory Visit: Payer: Self-pay | Admitting: Medical Genetics

## 2024-05-06 ENCOUNTER — Other Ambulatory Visit
